# Patient Record
Sex: Female | Born: 1970
Health system: Southern US, Community
[De-identification: ages and names within clinical notes are randomized; demographics above are authoritative.]

## PROBLEM LIST (undated history)

## (undated) DIAGNOSIS — F988 Other specified behavioral and emotional disorders with onset usually occurring in childhood and adolescence: Secondary | ICD-10-CM

## (undated) DIAGNOSIS — R030 Elevated blood-pressure reading, without diagnosis of hypertension: Secondary | ICD-10-CM

## (undated) DIAGNOSIS — K5900 Constipation, unspecified: Secondary | ICD-10-CM

## (undated) DIAGNOSIS — F419 Anxiety disorder, unspecified: Secondary | ICD-10-CM

## (undated) DIAGNOSIS — E739 Lactose intolerance, unspecified: Secondary | ICD-10-CM

## (undated) DIAGNOSIS — Z8049 Family history of malignant neoplasm of other genital organs: Secondary | ICD-10-CM

## (undated) DIAGNOSIS — Z8041 Family history of malignant neoplasm of ovary: Secondary | ICD-10-CM

## (undated) DIAGNOSIS — M1990 Unspecified osteoarthritis, unspecified site: Secondary | ICD-10-CM

## (undated) DIAGNOSIS — Z803 Family history of malignant neoplasm of breast: Secondary | ICD-10-CM

## (undated) DIAGNOSIS — R609 Edema, unspecified: Secondary | ICD-10-CM

## (undated) DIAGNOSIS — F32A Depression, unspecified: Secondary | ICD-10-CM

## (undated) DIAGNOSIS — F439 Reaction to severe stress, unspecified: Secondary | ICD-10-CM

## (undated) DIAGNOSIS — J45909 Unspecified asthma, uncomplicated: Secondary | ICD-10-CM

## (undated) DIAGNOSIS — F329 Major depressive disorder, single episode, unspecified: Secondary | ICD-10-CM

## (undated) DIAGNOSIS — G4730 Sleep apnea, unspecified: Secondary | ICD-10-CM

## (undated) DIAGNOSIS — T7840XA Allergy, unspecified, initial encounter: Secondary | ICD-10-CM

## (undated) DIAGNOSIS — Z800 Family history of malignant neoplasm of digestive organs: Secondary | ICD-10-CM

## (undated) DIAGNOSIS — M549 Dorsalgia, unspecified: Secondary | ICD-10-CM

## (undated) DIAGNOSIS — R7303 Prediabetes: Secondary | ICD-10-CM

## (undated) DIAGNOSIS — D649 Anemia, unspecified: Secondary | ICD-10-CM

## (undated) HISTORY — DX: Lactose intolerance, unspecified: E73.9

## (undated) HISTORY — DX: Edema, unspecified: R60.9

## (undated) HISTORY — DX: Family history of malignant neoplasm of digestive organs: Z80.0

## (undated) HISTORY — DX: Other specified behavioral and emotional disorders with onset usually occurring in childhood and adolescence: F98.8

## (undated) HISTORY — DX: Sleep apnea, unspecified: G47.30

## (undated) HISTORY — DX: Prediabetes: R73.03

## (undated) HISTORY — DX: Major depressive disorder, single episode, unspecified: F32.9

## (undated) HISTORY — DX: Elevated blood-pressure reading, without diagnosis of hypertension: R03.0

## (undated) HISTORY — DX: Unspecified asthma, uncomplicated: J45.909

## (undated) HISTORY — DX: Family history of malignant neoplasm of ovary: Z80.41

## (undated) HISTORY — DX: Unspecified osteoarthritis, unspecified site: M19.90

## (undated) HISTORY — PX: ANKLE SURGERY: SHX546

## (undated) HISTORY — DX: Family history of malignant neoplasm of breast: Z80.3

## (undated) HISTORY — DX: Depression, unspecified: F32.A

## (undated) HISTORY — DX: Family history of malignant neoplasm of other genital organs: Z80.49

## (undated) HISTORY — DX: Anxiety disorder, unspecified: F41.9

## (undated) HISTORY — DX: Allergy, unspecified, initial encounter: T78.40XA

## (undated) HISTORY — DX: Reaction to severe stress, unspecified: F43.9

## (undated) HISTORY — DX: Dorsalgia, unspecified: M54.9

## (undated) HISTORY — DX: Constipation, unspecified: K59.00

## (undated) HISTORY — DX: Anemia, unspecified: D64.9

---

## 1998-08-25 ENCOUNTER — Other Ambulatory Visit: Admission: RE | Admit: 1998-08-25 | Discharge: 1998-08-25 | Payer: Self-pay | Admitting: Obstetrics and Gynecology

## 2002-04-11 ENCOUNTER — Other Ambulatory Visit: Admission: RE | Admit: 2002-04-11 | Discharge: 2002-04-11 | Payer: Self-pay | Admitting: Obstetrics and Gynecology

## 2003-02-14 ENCOUNTER — Emergency Department (HOSPITAL_COMMUNITY): Admission: EM | Admit: 2003-02-14 | Discharge: 2003-02-14 | Payer: Self-pay | Admitting: Emergency Medicine

## 2003-02-14 ENCOUNTER — Encounter: Payer: Self-pay | Admitting: Emergency Medicine

## 2004-02-10 ENCOUNTER — Other Ambulatory Visit: Admission: RE | Admit: 2004-02-10 | Discharge: 2004-02-10 | Payer: Self-pay | Admitting: Obstetrics and Gynecology

## 2005-05-03 ENCOUNTER — Ambulatory Visit: Payer: Self-pay | Admitting: Internal Medicine

## 2005-05-03 ENCOUNTER — Other Ambulatory Visit: Admission: RE | Admit: 2005-05-03 | Discharge: 2005-05-03 | Payer: Self-pay | Admitting: Obstetrics and Gynecology

## 2005-11-01 ENCOUNTER — Ambulatory Visit: Payer: Self-pay | Admitting: Internal Medicine

## 2005-11-02 ENCOUNTER — Ambulatory Visit: Payer: Self-pay | Admitting: Internal Medicine

## 2005-11-03 ENCOUNTER — Ambulatory Visit: Payer: Self-pay | Admitting: Internal Medicine

## 2006-06-23 ENCOUNTER — Ambulatory Visit: Payer: Self-pay | Admitting: Internal Medicine

## 2006-11-08 ENCOUNTER — Ambulatory Visit: Payer: Self-pay | Admitting: Internal Medicine

## 2006-12-10 ENCOUNTER — Encounter: Payer: Self-pay | Admitting: Internal Medicine

## 2006-12-10 LAB — CONVERTED CEMR LAB

## 2006-12-11 ENCOUNTER — Ambulatory Visit: Payer: Self-pay | Admitting: Internal Medicine

## 2006-12-11 LAB — CONVERTED CEMR LAB
BUN: 7 mg/dL (ref 6–23)
CO2: 31 meq/L (ref 19–32)
Calcium: 8.9 mg/dL (ref 8.4–10.5)
Chloride: 108 meq/L (ref 96–112)
Creatinine, Ser: 0.7 mg/dL (ref 0.4–1.2)
GFR calc Af Amer: 122 mL/min
GFR calc non Af Amer: 101 mL/min
Glucose, Bld: 92 mg/dL (ref 70–99)
Hgb A1c MFr Bld: 6 % (ref 4.6–6.0)
Potassium: 4.1 meq/L (ref 3.5–5.1)
Sodium: 141 meq/L (ref 135–145)
TSH: 1.21 microintl units/mL (ref 0.35–5.50)

## 2006-12-13 ENCOUNTER — Ambulatory Visit: Payer: Self-pay | Admitting: Internal Medicine

## 2006-12-25 ENCOUNTER — Ambulatory Visit: Payer: Self-pay | Admitting: Internal Medicine

## 2007-02-08 ENCOUNTER — Ambulatory Visit: Payer: Self-pay | Admitting: Internal Medicine

## 2007-02-08 LAB — CONVERTED CEMR LAB
Bilirubin Urine: NEGATIVE
Crystals: NEGATIVE
Hemoglobin, Urine: NEGATIVE
Ketones, ur: NEGATIVE mg/dL
Leukocytes, UA: NEGATIVE
Nitrite: NEGATIVE
RBC / HPF: NONE SEEN
Specific Gravity, Urine: 1.02 (ref 1.000–1.03)
Total Protein, Urine: NEGATIVE mg/dL
Urine Glucose: NEGATIVE mg/dL
Urobilinogen, UA: 0.2 (ref 0.0–1.0)
pH: 6.5 (ref 5.0–8.0)

## 2007-02-26 ENCOUNTER — Ambulatory Visit: Payer: Self-pay | Admitting: Internal Medicine

## 2007-02-26 LAB — CONVERTED CEMR LAB
BUN: 8 mg/dL (ref 6–23)
CO2: 31 meq/L (ref 19–32)
Calcium: 9.1 mg/dL (ref 8.4–10.5)
Chloride: 105 meq/L (ref 96–112)
Creatinine, Ser: 0.7 mg/dL (ref 0.4–1.2)
GFR calc Af Amer: 122 mL/min
GFR calc non Af Amer: 101 mL/min
Glucose, Bld: 97 mg/dL (ref 70–99)
Potassium: 4.1 meq/L (ref 3.5–5.1)
Pro B Natriuretic peptide (BNP): 14 pg/mL (ref 0.0–100.0)
Sodium: 140 meq/L (ref 135–145)

## 2007-05-17 ENCOUNTER — Ambulatory Visit: Payer: Self-pay | Admitting: Internal Medicine

## 2007-05-17 DIAGNOSIS — J310 Chronic rhinitis: Secondary | ICD-10-CM | POA: Insufficient documentation

## 2007-05-17 DIAGNOSIS — J309 Allergic rhinitis, unspecified: Secondary | ICD-10-CM | POA: Insufficient documentation

## 2007-05-17 DIAGNOSIS — E669 Obesity, unspecified: Secondary | ICD-10-CM | POA: Insufficient documentation

## 2007-05-17 DIAGNOSIS — J45909 Unspecified asthma, uncomplicated: Secondary | ICD-10-CM | POA: Insufficient documentation

## 2007-05-17 DIAGNOSIS — S82899A Other fracture of unspecified lower leg, initial encounter for closed fracture: Secondary | ICD-10-CM | POA: Insufficient documentation

## 2007-05-17 DIAGNOSIS — D649 Anemia, unspecified: Secondary | ICD-10-CM | POA: Insufficient documentation

## 2007-05-17 DIAGNOSIS — Z862 Personal history of diseases of the blood and blood-forming organs and certain disorders involving the immune mechanism: Secondary | ICD-10-CM | POA: Insufficient documentation

## 2007-08-30 ENCOUNTER — Ambulatory Visit: Payer: Self-pay | Admitting: Internal Medicine

## 2007-08-30 DIAGNOSIS — R51 Headache: Secondary | ICD-10-CM | POA: Insufficient documentation

## 2007-08-30 DIAGNOSIS — R7309 Other abnormal glucose: Secondary | ICD-10-CM | POA: Insufficient documentation

## 2007-08-30 DIAGNOSIS — R519 Headache, unspecified: Secondary | ICD-10-CM | POA: Insufficient documentation

## 2007-08-30 LAB — CONVERTED CEMR LAB: Blood Glucose, Fingerstick: 118

## 2008-01-03 ENCOUNTER — Ambulatory Visit: Payer: Self-pay | Admitting: Internal Medicine

## 2008-01-03 DIAGNOSIS — G43909 Migraine, unspecified, not intractable, without status migrainosus: Secondary | ICD-10-CM | POA: Insufficient documentation

## 2008-01-03 DIAGNOSIS — R5383 Other fatigue: Secondary | ICD-10-CM

## 2008-01-03 DIAGNOSIS — R5381 Other malaise: Secondary | ICD-10-CM | POA: Insufficient documentation

## 2008-01-06 ENCOUNTER — Encounter: Payer: Self-pay | Admitting: Internal Medicine

## 2008-01-20 ENCOUNTER — Telehealth: Payer: Self-pay | Admitting: Internal Medicine

## 2008-03-26 ENCOUNTER — Ambulatory Visit: Payer: Self-pay | Admitting: Internal Medicine

## 2008-06-04 ENCOUNTER — Ambulatory Visit: Payer: Self-pay | Admitting: Internal Medicine

## 2008-06-04 DIAGNOSIS — R209 Unspecified disturbances of skin sensation: Secondary | ICD-10-CM | POA: Insufficient documentation

## 2008-10-26 LAB — HM MAMMOGRAPHY: HM Mammogram: NORMAL

## 2008-10-29 ENCOUNTER — Encounter: Admission: RE | Admit: 2008-10-29 | Discharge: 2008-10-29 | Payer: Self-pay | Admitting: Obstetrics and Gynecology

## 2008-11-27 ENCOUNTER — Ambulatory Visit: Payer: Self-pay | Admitting: Internal Medicine

## 2008-11-27 DIAGNOSIS — G4700 Insomnia, unspecified: Secondary | ICD-10-CM | POA: Insufficient documentation

## 2008-11-29 DIAGNOSIS — I10 Essential (primary) hypertension: Secondary | ICD-10-CM | POA: Insufficient documentation

## 2009-01-01 ENCOUNTER — Emergency Department (HOSPITAL_COMMUNITY): Admission: EM | Admit: 2009-01-01 | Discharge: 2009-01-01 | Payer: Self-pay | Admitting: Emergency Medicine

## 2009-02-23 LAB — CONVERTED CEMR LAB: Pap Smear: NORMAL

## 2009-03-19 ENCOUNTER — Ambulatory Visit: Payer: Self-pay | Admitting: Internal Medicine

## 2009-07-07 ENCOUNTER — Telehealth: Payer: Self-pay | Admitting: Internal Medicine

## 2009-10-25 ENCOUNTER — Ambulatory Visit: Payer: Self-pay | Admitting: Internal Medicine

## 2009-10-25 DIAGNOSIS — F4322 Adjustment disorder with anxiety: Secondary | ICD-10-CM | POA: Insufficient documentation

## 2009-10-25 LAB — CONVERTED CEMR LAB
BUN: 9 mg/dL (ref 6–23)
CO2: 28 meq/L (ref 19–32)
Calcium: 9.4 mg/dL (ref 8.4–10.5)
Chloride: 104 meq/L (ref 96–112)
Creatinine, Ser: 0.64 mg/dL (ref 0.40–1.20)
Free T4: 0.91 ng/dL (ref 0.80–1.80)
Glucose, Bld: 89 mg/dL (ref 70–99)
Hgb A1c MFr Bld: 5.5 % (ref <5.7)
Potassium: 4.4 meq/L (ref 3.5–5.3)
Sodium: 139 meq/L (ref 135–145)
TSH: 0.95 microintl units/mL (ref 0.350–4.500)

## 2009-10-28 ENCOUNTER — Encounter: Payer: Self-pay | Admitting: Internal Medicine

## 2009-12-06 ENCOUNTER — Ambulatory Visit: Payer: Self-pay | Admitting: Internal Medicine

## 2010-01-31 ENCOUNTER — Ambulatory Visit: Payer: Self-pay | Admitting: Internal Medicine

## 2010-06-06 ENCOUNTER — Encounter: Payer: Self-pay | Admitting: Internal Medicine

## 2010-06-06 ENCOUNTER — Ambulatory Visit: Payer: Self-pay | Admitting: Internal Medicine

## 2010-06-06 DIAGNOSIS — R259 Unspecified abnormal involuntary movements: Secondary | ICD-10-CM | POA: Insufficient documentation

## 2010-06-06 LAB — CONVERTED CEMR LAB
BUN: 10 mg/dL (ref 6–23)
CO2: 27 meq/L (ref 19–32)
Calcium: 9 mg/dL (ref 8.4–10.5)
Chloride: 104 meq/L (ref 96–112)
Creatinine, Ser: 0.73 mg/dL (ref 0.40–1.20)
Free T4: 0.76 ng/dL — ABNORMAL LOW (ref 0.80–1.80)
Glucose, Bld: 83 mg/dL (ref 70–99)
Magnesium: 2.2 mg/dL (ref 1.5–2.5)
Potassium: 4.1 meq/L (ref 3.5–5.3)
Sodium: 139 meq/L (ref 135–145)
TSH: 0.987 microintl units/mL (ref 0.350–4.500)

## 2010-06-07 ENCOUNTER — Telehealth: Payer: Self-pay | Admitting: Internal Medicine

## 2010-07-24 LAB — CONVERTED CEMR LAB
BUN: 8 mg/dL (ref 6–23)
Basophils Absolute: 0 10*3/uL (ref 0.0–0.1)
Basophils Relative: 0 % (ref 0–1)
Bilirubin Urine: NEGATIVE
CO2: 24 meq/L (ref 19–32)
Calcium: 9.2 mg/dL (ref 8.4–10.5)
Chloride: 101 meq/L (ref 96–112)
Creatinine, Ser: 0.7 mg/dL (ref 0.40–1.20)
Eosinophils Absolute: 0.1 10*3/uL (ref 0.0–0.7)
Eosinophils Relative: 1 % (ref 0–5)
Folate: >20 ng/mL
Glucose, Bld: 80 mg/dL (ref 70–99)
HCT: 40.2 % (ref 36.0–46.0)
Hemoglobin, Urine: NEGATIVE
Hemoglobin: 12.7 g/dL (ref 12.0–15.0)
Hgb A1c MFr Bld: 5.8 % (ref 4.6–6.1)
Ketones, ur: NEGATIVE mg/dL
Leukocytes, UA: NEGATIVE
Lymphocytes Relative: 38 % (ref 12–46)
Lymphs Abs: 2.2 10*3/uL (ref 0.7–4.0)
MCHC: 31.6 g/dL (ref 30.0–36.0)
MCV: 77.8 fL — ABNORMAL LOW (ref 78.0–100.0)
Monocytes Absolute: 0.4 10*3/uL (ref 0.1–1.0)
Monocytes Relative: 7 % (ref 3–12)
Neutro Abs: 3.1 10*3/uL (ref 1.7–7.7)
Neutrophils Relative %: 53 % (ref 43–77)
Nitrite: NEGATIVE
Platelets: 389 10*3/uL (ref 150–400)
Potassium: 4.7 meq/L (ref 3.5–5.3)
Protein, ur: NEGATIVE mg/dL
RBC / HPF: NONE SEEN (ref <3)
RBC: 5.17 M/uL — ABNORMAL HIGH (ref 3.87–5.11)
RDW: 15.3 % (ref 11.5–15.5)
Sodium: 136 meq/L (ref 135–145)
Specific Gravity, Urine: 1.018 (ref 1.005–1.03)
TSH: 0.722 microintl units/mL (ref 0.350–4.50)
Urine Glucose: NEGATIVE mg/dL
Urobilinogen, UA: 0.2 (ref 0.0–1.0)
Vitamin B-12: 278 pg/mL (ref 211–911)
WBC, UA: NONE SEEN cells/hpf (ref <3)
WBC: 5.8 10*3/uL (ref 4.0–10.5)
pH: 6.5 (ref 5.0–8.0)

## 2010-07-28 NOTE — Letter (Signed)
     January 06, 2008   Brooke [name] [address] [place], [place] [address]  RE:  LAB RESULTS  Dear  [name],  The following is an interpretation of your most recent lab tests.  Please take note of any instructions provided or changes to medications that have resulted from your lab work.  ELECTROLYTES:  Good - no changes needed  KIDNEY FUNCTION TESTS:  Good - no changes needed  THYROID STUDIES:  Thyroid studies normal TSH: 0.722     DIABETIC STUDIES:  Good - no changes needed Blood Glucose: 80   HgbA1C: 5.8     CBC:  Stable - no changes needed  B12 vitamin level - 278 (low normal)   I recommend starting an OTC Vitamin B complex daily.  A low normal B12 level can contribute to fatigue symptoms.  We should repeat your B12 level in 2 months.   Sincerely Yours,    Dr. Thomos Lemons

## 2010-07-28 NOTE — Assessment & Plan Note (Signed)
Summary: DISCUSS WEIGHT LOSS OPTIONS/NML   Vital Signs:  Patient Profile:   40 Years Old Female Height:     62 inches Weight:      268.25 pounds BMI:     49.24 Temp:     96.9 degrees F oral BP sitting:   140 / 80  (left arm)  Vitals Entered By: Glendell Docker (May 17, 2007 2:16 PM)                 Chief Complaint:  DICUSS WT GAIN.  History of Present Illness: 40 year old African-American female here for follow-up regarding obesity.  Patient has tried and failed dietary measures.  She is not able to stick to a diet.  She also joined a gym but is not gone a regular basis.  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  She often works late hours.  Current Allergies (reviewed today): No known allergies   Past Medical History:    Allergic rhinitis    Anemia-NOS    Hypertension    Genital Herpes   Family History:    Reviewed history and no changes required:  Social History:    Reviewed history and no changes required:    Review of Systems      See HPI   Physical Exam  General:     alert, normal appearance, and overweight-appearing.   Lungs:     Normal respiratory effort, chest expands symmetrically. Lungs are clear to auscultation, no crackles or wheezes. Heart:     Normal rate and regular rhythm. S1 and S2 normal without gallop, murmur, click, rub or other extra sounds. Extremities:     trace left pedal edema and trace right pedal edema.   Psych:     tearful.      Impression & Recommendations:  Problem # 1:  OBESITY (ICD-278.00) Pt has physical signs of insulin resistance.  Discussed risks and benefits of "wt loss" drugs. Advised trial of glucophage xr 500 mg by mouth two times a day (off label).  She wished to consider lap band surgery.   Problem # 2:  HYPERTENSION, ESSENTIAL NOS (ICD-401.9) Cont 1/2 of Benicar/Hctz daily.  The following medications were removed from the medication list:    Hydrochlorothiazide 12.5  Mg Tabs (Hydrochlorothiazide) ..... One by mouth once daily  Her updated medication list for this problem includes:    Benicar Hct 20-12.5 Mg Tabs (Olmesartan medoxomil-hctz) .Marland Kitchen... 1/2 by mouth once daily  Labs Reviewed: Creat: 0.7 (02/26/2007)   Complete Medication List: 1)  Benicar Hct 20-12.5 Mg Tabs (Olmesartan medoxomil-hctz) .... 1/2 by mouth once daily 2)  Glucophage Xr 500 Mg Tb24 (Metformin hcl) .... One by mouth two times a day 3)  Valtrex 1 Gm Tabs (Valacyclovir hcl) .... One by mouth two times a day     Prescriptions: GLUCOPHAGE XR 500 MG  TB24 (METFORMIN HCL) one by mouth two times a day  #60 x 3   Entered and Authorized by:   Dondra Spry DO   Signed by:   Dondra Spry DO on 05/17/2007   Method used:   Electronically sent to ...       [facility] #[id number]*       [address]       [place], [place]  [address]       Ph: [phone]       Fax: [phone]   RxID:  [id number]  ] 

## 2010-07-28 NOTE — Assessment & Plan Note (Signed)
Summary: re: Blood Pressure   Vital Signs:  Patient Profile:   40 Years Old Female Height:     62 inches Weight:      256.50 pounds BMI:     47.08 Temp:     98.3 degrees F oral Pulse rate:   82 / minute Resp:     16 per minute BP sitting:   110 / 80  (right arm) Cuff size:   large  Vitals Entered By: Glendell Docker CMA (March 26, 2008 3:46 PM)                 Chief Complaint:  Follow up Blood Pressure.  History of Present Illness:  Follow-Up Visit      This is a 40 year old woman who presents for Follow-up visit.  The patient denies chest pain and dizziness.  The patient reports monitoring BP.    Pt stopped topamax due to side effect.  He increased depressive symptoms.    Current Allergies (reviewed today): No known allergies   Past Medical History:    Allergic rhinitis    Anemia-NOS    Hypertension    Genital Herpes    Migraines    Social History:    Occupation:  Principle for Primary school teacher [employer]    Single    Never Smoked    Alcohol use-no     Review of Systems      See HPI   Physical Exam  General:     alert, well-developed, and well-nourished.   Head:     normocephalic and atraumatic.   Neck:     supple and no masses.   Lungs:     normal respiratory effort and normal breath sounds.   Heart:     normal rate, regular rhythm, and no gallop.   Extremities:     No lower extremity edema  Psych:     normally interactive, good eye contact, not anxious appearing, and not depressed appearing.      Impression & Recommendations:  Problem # 1:  HYPERTENSION (ICD-401.9) BP is stable off medication.   She reports less stress at work.  She has maintained most of her weight loss.   BP today: 110/80 Prior BP: 131/85 (01/03/2008)  Labs Reviewed: Creat: 0.70 (01/03/2008)   Problem # 2:  HEADACHE (ICD-784.0) Pt could not tolerate topamax.   She reports increased depressive symptoms and tearfulness.  Her symptoms has resolved with  cessation of topamax.  She is getting 2 headaches per month.   She take 600 mg of ibuprofen as soon as she feels symptoms.  Other Orders: Influenza Vaccine NON MCR (13086)   Patient Instructions: 1)  Please schedule a follow-up appointment in 4 months.   ]   Current Allergies (reviewed today): No known allergies    Influenza Vaccine    Vaccine Type: Fluvax Non-MCR    Site: left deltoid    Mfr: GlaxoSmithKline    Dose: 0.5 ml    Route: IM    Given by: Darra Lis RMA    Exp. Date: 12/23/2008    Lot #: [id number]  Flu Vaccine Consent Questions    Do you have a history of severe allergic reactions to this vaccine? no    Any prior history of allergic reactions to egg and/or gelatin? no    Do you have a sensitivity to the preservative Thimersol? no    Do you have a past history of Guillan-Barre Syndrome? no    Do you  currently have an acute febrile illness? no    Have you ever had a severe reaction to latex? no    Vaccine information given and explained to patient? yes    Are you currently pregnant? no

## 2010-07-28 NOTE — Assessment & Plan Note (Signed)
Summary: sleeping issues & check BP - jr   Vital Signs:  Patient profile:   40 year old female Weight:      262.50 pounds BMI:     48.19 Temp:     98.1 degrees F oral Pulse rate:   84 / minute Pulse rhythm:   regular BP sitting:   110 / 80  (left arm) Cuff size:   large  Vitals Entered By: Glendell Docker CMA (November 27, 2008 4:11 PM)  Primary Care Provider:  Dondra Spry DO  CC:  Difficulty Sleeping and Insomnia.  History of Present Illness: Insomnia      This is a 40 year old woman who presents with Insomnia.  The patient reports frequent awakening, early awakening, and daytime somnolence, but denies leg movements and snoring.  Insomnia has led to problems with memory loss and impaired judgement.  Risk factors for insomnia include obesity and caffeine use.  Behaviors that may contribute to insomnia include alcohol use.  She is principal for local elementary school.  She notes increased work stress.  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   Lungs:  normal respiratory effort and normal breath sounds.   Heart:  normal rate, regular rhythm, and no gallop.   Extremities:  trace left pedal edema and trace right pedal edema.   Neurologic:  cranial nerves II-XII intact and gait normal.   Psych:  normally interactive, good eye contact, and labile affect.     Impression & Recommendations:  Problem # 1:  INSOMNIA (ICD-780.52) 40 y/o AA female with insomnia over last 1 month.   Symptoms likely triggered by work stress.   We discussed stress mgt techniques.   Short term use of triazolam.   Patient advised to call office if symptoms persist or worsen.   Her  updated medication list for this problem includes:    Triazolam 0.125 Mg Tabs (Triazolam) ..... One by mouth at bedtime prn  Discussed sleep hygiene.   Complete Medication List: 1)  Triazolam 0.125 Mg Tabs (Triazolam) .... One by mouth at bedtime prn  Patient Instructions: 1)  Please schedule a follow-up appointment as needed. Prescriptions: TRIAZOLAM 0.125 MG TABS (TRIAZOLAM) one by mouth at bedtime prn  #30 x 0   Entered and Authorized by:   D. Thomos Lemons DO   Signed by:   D. Thomos Lemons DO on 11/27/2008   Method used:   Print then Give to Patient   RxID:   [id number]   Current Allergies (reviewed today): No known allergies

## 2010-07-28 NOTE — Assessment & Plan Note (Signed)
Summary: TRIED ALL TIME/BLOOD SUGAR/HEA   Vital Signs:  Patient Profile:   39 Years Old Female Height:     62 inches Weight:      257.25 pounds BMI:     47.22 Temp:     98 degrees F oral Pulse rate:   75 / minute Pulse rhythm:   regular Resp:     18 per minute BP sitting:   131 / 85  (left arm)  Vitals Entered By: Glendell Docker (January 03, 2008 10:56 AM)                 Chief Complaint:  Multiple medical problems or concerns.  History of Present Illness: Follow-Up Visit      40 year old African-American female with past medical history of hypertension for follow-up.  Since previous visit patient complains of moderate fatigue for the last one to two weeks.  She has been sleeping more than usual.  She denies recent illness.   She denies respiratory or urinary complaints.  She also has a history of migraine headaches.  Patient was seen in March of 2009 for severe headache.  Patient reports having at least one to two headaches per month.  Her migraines are often associated with her menstrual cycle.  She is complaining of discomfort in her right shoulder with pain on abduction. She says this has been ongoing for the past 5 months.  Her symptoms are worse when she lays on her right shoulder at night.  She denies joint redness or swelling.    Current Allergies (reviewed today): No known allergies   Past Medical History:    Reviewed history from 08/30/2007 and no changes required:       Allergic rhinitis       Anemia-NOS       Hypertension       Genital Herpes       Migraines   Social History:    Occupation:  Principle for Primary school teacher school [employer]    Single    Never Smoked    Alcohol use-no   Risk Factors:  Tobacco use:  never Alcohol use:  no   Review of Systems      See HPI   Physical Exam  General:     alert, well-developed, and well-nourished.   Head:     normocephalic and atraumatic.   Eyes:     vision grossly intact, pupils equal, pupils  round, and pupils reactive to light.   Ears:     R ear normal and L ear normal.   Mouth:     Oral mucosa and oropharynx without lesions or exudates.  Teeth in good repair. Neck:     supple and no masses.   Lungs:     normal respiratory effort and normal breath sounds.   Heart:     normal rate, regular rhythm, no murmur, and no gallop.   Abdomen:     soft and non-tender.   Extremities:     No lower extremity edema  Neurologic:     cranial nerves II-XII intact and strength normal in all extremities.   Skin:     turgor normal and color normal.   Psych:     normally interactive and good eye contact.      Impression & Recommendations:  Problem # 1:  FATIGUE (ICD-780.79) Rule out organic etiology.  ? viral syndrome.  If persistent, consider sleep study Orders: T-Basic Metabolic Panel [id number]) T-CBC w/Diff [id number]) T- Hemoglobin A1C ([id number]) T-TSH [id number])  T-Vitamin B12 [id number]) T-Culture, Urine ([id number]) T- * Misc. Laboratory test [phone])   Problem # 2:  ABNORMAL GLUCOSE NEC (ICD-790.29) Monitor A1c. The following medications were removed from the medication list:    Glucophage Xr 500 Mg Tb24 (Metformin hcl) ..... One by mouth two times a day  Orders: T- Hemoglobin A1C ([id number])   Problem # 3:  MIGRAINE HEADACHE (ICD-346.90) Start topamax for migraine prophylaxis.  We discussed common side effects.  The following medications were removed from the medication list:    Fioricet 50-325-40 Mg Tabs (Butalbital-apap-caffeine) .Marland Kitchen... 1-2 by mouth qid prn    Ibuprofen 600 Mg Tabs (Ibuprofen) .Marland Kitchen... 1 by mouth three times a day as needed pain   Problem # 4:  HYPERTENSION (ICD-401.9) Pt stopped Benicar.  BP has been stable.  Pt will continue to monitor BP.  The following medications were removed from the medication list:    Benicar Hct 20-12.5 Mg Tabs (Olmesartan medoxomil-hctz) .Marland Kitchen... 1/2 by mouth once daily  BP today: 131/85 Prior BP:  133/87 (08/30/2007)  Labs Reviewed: Creat: 0.7 (02/26/2007)   Complete Medication List: 1)  Topamax 25 Mg Tabs (Topiramate) .... One by mouth at bedtime x 7 days, then 50 mg by mouth qhs   Patient Instructions: 1)  Please schedule a follow-up appointment in 6 weeks.   Prescriptions: TOPAMAX 25 MG  TABS (TOPIRAMATE) one by mouth at bedtime x 7 days, then 50 mg by mouth qhs  #60 x 2   Entered by:   Glendell Docker   Authorized by:   D. Thomos Lemons DO   Signed by:   Glendell Docker on 01/03/2008   Method used:   Faxed to ...       [facility] [address] #[id number]*       [address]       [place], [place]  [address]       Ph: [phone]       Fax: [phone]   RxID:   [id number] TOPAMAX 25 MG  TABS (TOPIRAMATE) one by mouth at bedtime x 7 days, then 50 mg by mouth qhs  #60 x 2   Entered and Authorized by:   D. Thomos Lemons DO   Signed by:   D. Thomos Lemons DO on 01/03/2008   Method used:   Electronically sent to ...       [facility] #[id number]       [address]       [place], [place]  [address]       Ph: [phone]       Fax: [phone]   RxID:   [id number]  ] Current Allergies (reviewed today): No known allergies Pharmacy correction previous Rx to [facility] cancelled. Glendell Docker  January 03, 2008 4:28 PM

## 2010-07-28 NOTE — Assessment & Plan Note (Signed)
Summary: BP CK  /HEA   Vital Signs:  Patient profile:   40 year old female Menstrual status:  regular LMP:     03/19/2009 Weight:      266.50 pounds Temp:     98.4 degrees F oral Pulse rate:   72 / minute Pulse rhythm:   regular Resp:     18 per minute BP sitting:   128 / 70  (right arm) Cuff size:   large  Vitals Entered By: Glendell Docker CMA (March 19, 2009 10:04 AM) CC: Elevated Blood Pressure LMP (date): 03/19/2009     Menstrual Status regular Enter LMP: 03/19/2009 Last PAP Result normal   Primary Care Provider:  Dondra Spry DO  CC:  Elevated Blood Pressure.  History of Present Illness: 40 y/o AA F for BP follow up.  Blood pressure was checked by school nurse yesterday and was 148/100,  and she was advised to schedule office visit for evaluation.  No headaches or chest pain.   + stress at work.   She has early meetings and works late.  No eating right.  Gained 4 lbs.  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   Neck:  supple and no masses.  no carotid bruits.   Lungs:  normal respiratory effort and normal breath sounds.   Heart:  normal rate, regular rhythm, no murmur, and no gallop.   Extremities:  No lower extremity edema    Impression & Recommendations:  Problem # 1:  ELEVATED BLOOD PRESSURE WITHOUT DIAGNOSIS OF HYPERTENSION (ICD-796.2) Pt with prehypertension.  She discussed whether to restart Hctz vs lifestyle modification.  She elects lifestyle modification.   Continue to monitor BP at home.   BP today: 128/70 Prior BP: 110/80 (11/27/2008)  Labs Reviewed: Creat: 0.70 (01/03/2008)  Instructed in low sodium  diet (DASH Handout) and behavior modification.     Preventive Care Screening  Pap Smear:    Date:  02/23/2009    Results:  normal   Mammogram:    Date:  10/26/2008    Results:  normal    Current Allergies (reviewed today): No known allergies

## 2010-07-28 NOTE — Progress Notes (Signed)
Summary: Lab results  Phone Note Outgoing Call   Summary of Call: call pt - labs normal  Initial call taken by: D. Thomos Lemons DO,  June 07, 2010 8:18 AM  Follow-up for Phone Call        call placed to patient at [phone], no  answer. A detaied voice message was left informing patient per Dr Artist Pais instructions Follow-up by: Glendell Docker CMA,  June 07, 2010 9:08 AM

## 2010-07-28 NOTE — Assessment & Plan Note (Signed)
Summary: DR Artist Pais PT--SEVERE HEAD ACHE SINCE YESTERDAY PM-$50 INFO-STC   Vital Signs:  Patient Profile:   40 Years Old Female Height:     62 inches Weight:      163 pounds Temp:     97.5 degrees F oral Pulse rate:   69 / minute BP sitting:   133 / 87  (right arm)             Is Patient Diabetic? Yes  CBG Result 118     Chief Complaint:  Multiple medical problems or concerns.  History of Present Illness: C/o HA x 2 d, constant over the forehead.    Current Allergies: No known allergies   Past Medical History:    Allergic rhinitis    Anemia-NOS    Hypertension    Genital Herpes    Migraines      Physical Exam  General:     overweight-appearing.   Eyes:     No corneal or conjunctival inflammation noted. EOMI. Perrla. Funduscopic exam benign, without hemorrhages, exudates or papilledema. Vision grossly normal. Ears:     External ear exam shows no significant lesions or deformities.  Otoscopic examination reveals clear canals, tympanic membranes are intact bilaterally without bulging, retraction, inflammation or discharge. Hearing is grossly normal bilaterally. Nose:     External nasal examination shows no deformity or inflammation. Nasal mucosa are pink and moist without lesions or exudates. Mouth:     Oral mucosa and oropharynx without lesions or exudates.  Teeth in good repair. Neck:     No deformities, masses, or tenderness noted. Lungs:     Normal respiratory effort, chest expands symmetrically. Lungs are clear to auscultation, no crackles or wheezes. Heart:     Normal rate and regular rhythm. S1 and S2 normal without gallop, murmur, click, rub or other extra sounds. Abdomen:     Bowel sounds positive,abdomen soft and non-tender without masses, organomegaly or hernias noted. Msk:     Neck supple Neurologic:     No cranial nerve deficits noted. Station and gait are normal. Plantar reflexes are down-going bilaterally. DTRs are symmetrical throughout.  Sensory, motor and coordinative functions appear intact.    Impression & Recommendations:  Problem # 1:  HEADACHE (ICD-784.0)  Her updated medication list for this problem includes:    Fioricet 50-325-40 Mg Tabs (Butalbital-apap-caffeine) .Marland Kitchen... 1-2 by mouth qid prn    Ibuprofen 600 Mg Tabs (Ibuprofen) .Marland Kitchen... 1 by mouth three times a day as needed pain   Problem # 2:  ABNORMAL GLUCOSE NEC (ICD-790.29) Glu=118 Her updated medication list for this problem includes:    Glucophage Xr 500 Mg Tb24 (Metformin hcl) ..... One by mouth two times a day  Orders: Glucose, (CBG) ([address])   Complete Medication List: 1)  Benicar Hct 20-12.5 Mg Tabs (Olmesartan medoxomil-hctz) .... 1/2 by mouth once daily 2)  Glucophage Xr 500 Mg Tb24 (Metformin hcl) .... One by mouth two times a day 3)  Valtrex 1 Gm Tabs (Valacyclovir hcl) .... One by mouth two times a day 4)  Fioricet 50-325-40 Mg Tabs (Butalbital-apap-caffeine) .Marland Kitchen.. 1-2 by mouth qid prn 5)  Ibuprofen 600 Mg Tabs (Ibuprofen) .Marland Kitchen.. 1 by mouth three times a day as needed pain   Patient Instructions: 1)  Please schedule a follow-up appointment in 3 months Dr Artist Pais. 2)  BMP prior to visit, ICD-9: 3)  HbgA1C prior to visit, ICD-9:250.00    Prescriptions: IBUPROFEN 600 MG  TABS (IBUPROFEN) 1 by mouth three times a day  as needed pain  #60 x 3   Entered and Authorized by:   Tresa Garter MD   Signed by:   Tresa Garter MD on 08/30/2007   Method used:   Print then Give to Patient   RxID:   [id number] FIORICET 50-325-40 MG  TABS (BUTALBITAL-APAP-CAFFEINE) 1-2 by mouth qid prn  #60 x 3   Entered and Authorized by:   Tresa Garter MD   Signed by:   Tresa Garter MD on 08/30/2007   Method used:   Print then Give to Patient   RxID:   [id number]  ]

## 2010-07-28 NOTE — Progress Notes (Signed)
Summary: Triazolam Refill  Phone Note Call from Patient Call back at [phone]   Caller: Patient Summary of Call: patient called and left voice message requesting a refill on a mild sedative that she was prescribed a few months back.   call returned to patient for clarification of medication needed for refill. She is requesting a refill on  Triazolam 0.125. If approved she is requesting a refill to [facility] on [address]. Patient was informed that Dr Artist Pais was out of the office this afternoon and she would receive a call back  in the morning regarding the status. She verablized understanding and said she may be in a meeting so it is okay to leave a message if she could not be reached. Initial call taken by: Glendell Docker CMA,  July 07, 2009 4:11 PM  Follow-up for Phone Call        ok to refill x 3 Follow-up by: D. Thomos Lemons DO,  July 08, 2009 5:53 PM  Additional Follow-up for Phone Call Additional follow up Details #1::        Rx Called In, patient informed Additional Follow-up by: Glendell Docker CMA,  July 09, 2009 8:14 AM    New/Updated Medications: TRIAZOLAM 0.125 MG TABS (TRIAZOLAM) Take 1 tab by mouth at bedtime as needed Prescriptions: TRIAZOLAM 0.125 MG TABS (TRIAZOLAM) Take 1 tab by mouth at bedtime as needed  #30 x 3   Entered by:   Glendell Docker CMA   Authorized by:   D. Thomos Lemons DO   Signed by:   Glendell Docker CMA on 07/09/2009   Method used:   Telephoned to ...       [facility] [address] #[id number]* (retail)       [address] [place], [place]  [address]       Ph: [phone]       Fax: [phone]   RxID:   [id number]

## 2010-07-28 NOTE — Assessment & Plan Note (Signed)
Summary: 2 month fu/dt   Vital Signs:  Patient profile:   40 year old female Menstrual status:  regular Weight:      260 pounds O2 Sat:      99 % on Room air Temp:     98.0 degrees F oral Pulse rate:   87 / minute Pulse rhythm:   regular Resp:     18 per minute BP sitting:   120 / 80  (right arm) Cuff size:   large  Vitals Entered By: Glendell Docker CMA (January 31, 2010 4:12 PM)  O2 Flow:  Room air CC: Rm 3- 2 Month Follow up Is Patient Diabetic? No Pain Assessment Patient in pain? no        Primary Care Provider:  DThomos Lemons DO  CC:  Rm 3- 2 Month Follow up.  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   Lungs:  normal respiratory effort, normal breath sounds, and no wheezes.   Heart:  normal rate, regular rhythm, and no gallop.   Abdomen:  soft, non-tender, and normal bowel sounds.   Psych:  normally interactive, good eye contact, not anxious appearing, and not depressed appearing.     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  she would like to continue medication her mother has noticed positive change in her mood concentration is also better  Problem #  2:  INSOMNIA (ICD-780.52) use as needed.  pt advised to avoid daily use  Her updated medication list for this problem includes:    Triazolam 0.25 Mg Tabs (Triazolam) ..... One by mouth qhs  Complete Medication List: 1)  Triazolam 0.25 Mg Tabs (Triazolam) .... One by mouth qhs 2)  Budeprion Xl 300 Mg Xr24h-tab (Bupropion hcl) .... One by mouth once daily  Patient Instructions: 1)  Please schedule a follow-up appointment in 4 months. Prescriptions: TRIAZOLAM 0.25 MG TABS (TRIAZOLAM) one by mouth qhs  #30 x 3   Entered and Authorized by:   D. Thomos Lemons DO   Signed by:   D. Thomos Lemons DO on 01/31/2010   Method used:   Print then Give to Patient   RxID:   [id number] BUDEPRION XL 300 MG XR24H-TAB (BUPROPION HCL) one by mouth once daily  #30 x 5   Entered and Authorized by:   D. Thomos Lemons DO   Signed by:   D. Thomos Lemons DO on 01/31/2010   Method used:   Electronically to        [id number]* (retail)       [address] [place], [place]  [address]       Ph: [phone]  Fax: [phone]   RxID:   [id number]   Current Allergies (reviewed today): No known allergies

## 2010-07-28 NOTE — Letter (Signed)
Summary: Work Dietitian at [employer]. [address]   [place], [place] [address]   Phone: [phone]  Fax: [phone]        Today's Date: Oct 25, 2009   Name of Patient: Brooke [name]  The above named patient had a medical visit today at:  11:30 am. Please take this into consideration when reviewing the time away from work.    Sincerely yours,    Mervin Kung CMA Thomos Lemons, DO

## 2010-07-28 NOTE — Assessment & Plan Note (Signed)
Summary: F/U/hea--rm 3   Vital Signs:  Patient profile:   41 year old female Menstrual status:  regular Height:      62 inches Weight:      256 pounds BMI:     46.99 Temp:     98.8 degrees F oral Pulse rate:   84 / minute Pulse rhythm:   regular Resp:     16 per minute BP sitting:   138 / 88  (right arm) Cuff size:   large  Vitals Entered By: Mervin Kung CMA (December 06, 2009 4:05 PM) CC: Room 3  Follow up. Is Patient Diabetic? 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   Neck:  supple and no masses.  no carotid bruits.   Lungs:  normal respiratory effort, normal breath sounds, and no wheezes.   Heart:  normal rate, regular rhythm, and no gallop.   Extremities:  trace left pedal edema and trace right pedal edema.   Neurologic:  cranial nerves II-XII intact and gait normal.     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  no side effects.  titrate dose to 300 mg to improve concentration  Complete Medication List: 1)  Triazolam 0.125 Mg Tabs (Triazolam) .... Take 1 tab by mouth at bedtime as needed 2)  Budeprion Xl 300 Mg Xr24h-tab (Bupropion hcl)  .... One by mouth once daily  Patient Instructions: 1)  Please schedule a follow-up appointment in 2 months. Prescriptions: BUDEPRION XL 300 MG XR24H-TAB (BUPROPION HCL) one by mouth once daily  #30 x 2   Entered and Authorized by:   D. Thomos Lemons DO   Signed by:   D. Thomos Lemons DO on 12/06/2009   Method used:   Electronically to        [id number]* (retail)       [address] [place], [place]  [address]       Ph: [phone]       Fax: [phone]   RxID:   [id number]

## 2010-07-28 NOTE — Letter (Signed)
Summary: Weight Mgmt Form/[facility]  Weight Mgmt [facility]   Imported By: Lanelle Bal 12/13/2009 11:20:06  _____________________________________________________________________  External Attachment:    Type:   Image     Comment:   External Document

## 2010-07-28 NOTE — Progress Notes (Signed)
Summary: WANTS TO STOP TAKING MIGRANE MED  Phone Note Call from Patient   Caller: Patient Details for Reason: WATNS TO STOP TAKING NEW MED FOR MIGRANES Summary of Call: PT CALLED STATING SHE WANTS TO STOP TAKING THE NEW MEDICATE THAT STARTS WITH A "T". AS IT IS MAKING HER FEEL CRAZY, IE SHE CRYING A LOT AND MAKES HER DEPRESSED.  SHE WANTS TO KNOW IF THIS IS A DRUG THAT SHE NEEDS TO GRADUALLY WORK HER WAY OFF OR IF SHE CAN JUST STOP IMMEDIATELY.  PLEASE CALL HER ATER 4 PM OR LEAVE HER A MESSAGE, SHE IS GOING TO BE IN TRAINING.  [phone] Initial call taken by: Eliezer Lofts,  January 20, 2008 12:02 PM  Follow-up for Phone Call        reduce to 25 mg by mouth once daily x 5 days then stop.  Rec OV Follow-up by: D. Thomos Lemons DO,  January 20, 2008 9:40 PM  Additional Follow-up for Phone Call Additional follow up Details #1::        Left voice message at [phone] to call Additional Follow-up by: Glendell Docker CMA,  January 21, 2008 8:53 AM    Additional Follow-up for Phone Call Additional follow up Details #2::    Patient informed per Dr Artist Pais instructions Follow-up by: Glendell Docker CMA,  January 21, 2008 6:43 PM

## 2010-07-28 NOTE — Assessment & Plan Note (Signed)
Summary: toes have been numb for the past week   Vital Signs:  Patient Profile:   40 Years Old Female Height:     62 inches Weight:      257.50 pounds BMI:     47.27 Temp:     98.0 degrees F oral Pulse rate:   86 / minute Pulse rhythm:   regular Resp:     18 per minute BP sitting:   112 / 78  (right arm) Cuff size:   large  Vitals Entered By: Glendell Docker CMA (June 04, 2008 1:46 PM)                 PCP:  Dondra Spry DO  Chief Complaint:  C/O numbness in left toes for the past week.  History of Present Illness: 40 y/o AA female with hx of elevated BP in the past c/o numbness in left toes for the past week.  Her symptoms worse with tight fitting shoes.  No foot pain.   No change in color of skin.    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   Neck:     supple and no masses.   Lungs:     normal respiratory effort and normal breath sounds.   Heart:     normal rate, regular rhythm, and no gallop.   Pulses:     dorsalis pedis and posterior tibial pulses are full and equal bilaterally Extremities:     trace left pedal edema and trace right pedal edema.   Neurologic:     Normal sensation to temp and vibration of LE.    Impression & Recommendations:  Problem # 1:  PARESTHESIA (ICD-782.0) Numbness in first 2-3 toes of left foot.   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  Pt advised to wear comfortable shoes.  Patient advised to call office if symptoms persist or worsen.  If symptoms worsen, refer to ortho or podiatry.  ? Tarsal tunnel syndrome.       ] Current Allergies (reviewed today): No known allergies

## 2010-07-28 NOTE — Letter (Signed)
   [facility] [address] [place], [place]  [address]  [place] Phone: [phone]      Oct 28, 2009   Brooke [name] [address] [place], [place] [address]  RE:  LAB RESULTS  Dear  [name],  The following is an interpretation of your most recent lab tests.  Please take note of any instructions provided or changes to medications that have resulted from your lab work.  ELECTROLYTES:  Good - no changes needed  KIDNEY FUNCTION TESTS:  Good - no changes needed  THYROID STUDIES:  Thyroid studies normal TSH: 0.950     DIABETIC STUDIES:  Excellent - no changes needed Blood Glucose: 89   HgbA1C: 5.5          Sincerely Yours,    Dr. Thomos Lemons

## 2010-07-28 NOTE — Assessment & Plan Note (Signed)
Summary: 4 month fu/dt   Vital Signs:  Patient profile:   40 year old female Menstrual status:  regular Height:      62 inches Weight:      261.50 pounds BMI:     48.00 O2 Sat:      100 % on Room air Temp:     97.9 degrees F oral Pulse rate:   78 / minute Resp:     18 per minute BP sitting:   122 / 90  (right arm) Cuff size:   large  Vitals Entered By: Glendell Docker CMA (June 06, 2010 3:50 PM)  O2 Flow:  Room air CC: 4 Month Follow up Comments c/o muslce spasms in right eye for the past 3 weeks with eye pain, intermittently, lasting about 10 seconds 3-4 times in about 1 hour intervals, would like to know if it could be stress related   Primary Care Provider:  DThomos Lemons DO  CC:  4 Month Follow up.  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   Lungs:  normal respiratory effort, normal breath sounds, and no wheezes.   Heart:  normal rate, regular rhythm, and no gallop.   Extremities:  trace left pedal edema and trace right pedal edema.   Psych:  normally interactive, good eye contact, not anxious appearing, and not depressed appearing.     Impression & Recommendations:  Problem # 1:  MUSCLE FASCICULATIONS (ICD-781.0) no significant caffeine use.  likely benign. check electrolytes  Orders: T-Basic Metabolic Panel [phone]) T-TSH [phone]) T-T4, Free [phone]) T-Magnesium [phone])  Problem #  2:  ADJUSTMENT DISORDER WITH ANXIOUS MOOD [phone]) Assessment: Improved  Problem # 3:  OBESITY (ICD-278.00) I advised against use of phenterimine Pt counseled on diet and exercise.  Complete Medication List: 1)  Triazolam 0.25 Mg Tabs (Triazolam) .... One by mouth qhs 2)  Budeprion Xl 300 Mg Xr24h-tab (Bupropion hcl) .... One by mouth once daily  Patient Instructions: 1)  Please schedule a follow-up appointment in 6 months. Prescriptions: TRIAZOLAM 0.25 MG TABS (TRIAZOLAM) one by mouth qhs  #30 x 3   Entered and Authorized by:   D. Thomos Lemons DO   Signed by:   D. Thomos Lemons DO on 06/06/2010   Method used:   Print then Give to Patient   RxID:   [id number] BUDEPRION XL 300 MG XR24H-TAB (BUPROPION HCL) one by mouth once daily  #90 x 1   Entered and Authorized by:   D. Thomos Lemons DO   Signed by:   D. Thomos Lemons DO on 06/06/2010   Method used:   Electronically to        [id number]* (retail)       [address] [place], [place]  [address]       Ph: [phone]       Fax: [phone]  RxID:   [id number]    Orders Added: 1)  T-Basic Metabolic Panel [[id number]] 2)  T-TSH [[id number]] 3)  T-T4, Free [[id number]] 4)  T-Magnesium [[id number]] 5)  Est. Patient Level III [[address]25]    Current Allergies (reviewed today): No known allergies

## 2010-07-28 NOTE — Assessment & Plan Note (Signed)
Summary: blood pressure diabetes?/mhf   Vital Signs:  Patient profile:   40 year old Brooke [name] Menstrual status:  regular Height:      62 inches Weight:      258.50 pounds BMI:     47.45 O2 Sat:      99 % on Room air Temp:     97.9 degrees F oral Pulse rate:   Brooke / minute BP sitting:   122 / 80  (right arm) Cuff size:   large  Vitals Entered By: Lucious Groves (Oct 25, 2009 12:02 PM)  O2 Flow:  Room air CC: C/O increased BP, HA, blurry vision, and circulation problems in arms/hands./kb Is Patient Diabetic? No Pain Assessment Patient in pain? no        Primary Care Provider:  Dondra Spry DO  CC:  C/O increased BP, HA, blurry vision, and and circulation problems in arms/hands./kb.  History of Present Illness: 40 year old African American Brooke [name] for followup  she has history of elevated BP without diagnosis of hypertension She has noticed occasional blurry vision and is concerned she may have developed diabetes Increased stress at work,  she is worried about losing her job she is having trouble concentrating at work also having financial issues  Current Medications (verified): 1)  Triazolam 0.125 Mg Tabs (Triazolam) .... 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   Lungs:  normal respiratory effort, normal breath sounds, and no wheezes.   Heart:  normal rate, regular rhythm, and no gallop.   Abdomen:  soft, non-tender, and normal bowel sounds.   Extremities:  trace left pedal edema and trace right pedal edema.   Neurologic:  cranial nerves II-XII intact and gait normal.   Psych:   normally interactive, good eye contact, not anxious appearing, and not depressed appearing.     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   trial of wellbutrin.  Call our office if your symptoms do not  improve or gets worse.   Orders: T-TSH [id number]) T-T4, Free [phone])  Problem # 2:  ABNORMAL GLUCOSE NEC (ICD-790.29) she notes occ blurry vision.   monitor for diabetes Pt counseled on diet and exercise.  Orders: T- Hemoglobin A1C ([id number]) T-Basic Metabolic Panel ([id number])  Complete Medication List: 1)  Triazolam 0.125 Mg Tabs (Triazolam) .... Take 1 tab by mouth at bedtime as needed 2)  Budeprion Xl 150 Mg Xr24h-tab (Bupropion hcl) .... One by mouth once daily  Patient Instructions: 1)  Please schedule a follow-up appointment in 1 month. 2)  Call our office if your symptoms do not  improve or gets worse. 3)  Use wrist splint at bedtime for 2-4 weeks Prescriptions: TRIAZOLAM 0.125 MG TABS (TRIAZOLAM) Take 1 tab by mouth at bedtime as needed  #30 x 2   Entered and Authorized by:   D. Thomos Lemons DO   Signed by:   D. Thomos Lemons DO on 10/25/2009   Method used:   Print then Give to Patient   RxID:   [id number] BUDEPRION XL 150 MG XR24H-TAB (BUPROPION  HCL) one by mouth once daily  #30 x 1   Entered and Authorized by:   D. Thomos Lemons DO   Signed by:   D. Thomos Lemons DO on 10/25/2009   Method used:   Electronically to        [id number]* (retail)       [address] [place], [place]  [address]       Ph: [phone]       Fax: [phone]   RxID:   [id number]

## 2010-09-06 LAB — HM PAP SMEAR: HM Pap smear: NORMAL

## 2010-09-13 ENCOUNTER — Telehealth: Payer: Self-pay | Admitting: Internal Medicine

## 2010-09-16 ENCOUNTER — Telehealth: Payer: Self-pay | Admitting: Internal Medicine

## 2010-09-16 DIAGNOSIS — G4700 Insomnia, unspecified: Secondary | ICD-10-CM

## 2010-09-16 NOTE — Telephone Encounter (Signed)
Triazolam 0.25mg  tablets. Qty 30. Take 1 tablet by mouth every night at bedtime. Last fill 10.26.11

## 2010-09-19 MED ORDER — TRIAZOLAM 0.25 MG PO TABS: 0.2500 mg | ORAL_TABLET | Freq: Every evening | ORAL | Status: DC | PRN

## 2010-09-19 NOTE — Telephone Encounter (Signed)
Ok to refill x 3 

## 2010-09-19 NOTE — Telephone Encounter (Signed)
Rx called to pharmacy

## 2010-09-27 NOTE — Progress Notes (Signed)
Summary: refill--triazolam  Phone Note Refill Request Message from:  Fax from Pharmacy on September 13, 2010 8:32 AM  Refills Requested: Medication #1:  TRIAZOLAM 0.25 MG TABS one by mouth qhs   Dosage confirmed as above?Dosage Confirmed   Supply Requested: 1 month   Last Refilled: 08/30/2010 Next Appointment Scheduled: June 8,12 Dr Artist Pais Initial call taken by: Mervin Kung CMA Duncan Dull),  September 13, 2010 8:32 AM  Follow-up for Phone Call        ok to refill x3 Follow-up by: D. Thomos Lemons DO,  September 13, 2010 6:01 PM  Additional Follow-up for Phone Call Additional follow up Details #1::        Rx called to pharmacy Additional Follow-up by: Glendell Docker CMA,  September 20, 2010 8:11 AM

## 2010-10-17 ENCOUNTER — Telehealth: Payer: Self-pay | Admitting: *Deleted

## 2010-10-17 NOTE — Telephone Encounter (Signed)
I would use monistat over the counter. If she has persistent vaginal discharge I suggest followup with our nurse practitioner or her gynecologist for pelvic exam and wet prep.

## 2010-10-17 NOTE — Telephone Encounter (Signed)
Patient called and left voice message wanting to know if Dr Artist Pais would provide her a rx for a yeast infection without her having to come into the office.

## 2010-10-18 NOTE — Telephone Encounter (Signed)
Call placed to patient at [phone], she was informed per Dr Artist Pais instructions and has verbalized understanding and agrees as instructed

## 2010-11-14 ENCOUNTER — Ambulatory Visit: Payer: Self-pay | Admitting: Family

## 2010-11-14 ENCOUNTER — Encounter: Payer: Self-pay | Admitting: Internal Medicine

## 2010-11-16 ENCOUNTER — Encounter: Payer: Self-pay | Admitting: Family

## 2010-11-16 ENCOUNTER — Ambulatory Visit (INDEPENDENT_AMBULATORY_CARE_PROVIDER_SITE_OTHER): Payer: BC Managed Care – PPO | Admitting: Family

## 2010-11-16 DIAGNOSIS — J329 Chronic sinusitis, unspecified: Secondary | ICD-10-CM

## 2010-11-16 DIAGNOSIS — H6690 Otitis media, unspecified, unspecified ear: Secondary | ICD-10-CM

## 2010-11-16 DIAGNOSIS — H6693 Otitis media, unspecified, bilateral: Secondary | ICD-10-CM

## 2010-11-16 MED ORDER — CEFUROXIME AXETIL 500 MG PO TABS: 500.0000 mg | ORAL_TABLET | Freq: Two times a day (BID) | ORAL | Status: AC

## 2010-11-16 NOTE — Assessment & Plan Note (Signed)
Treat with Ceftin.

## 2010-11-16 NOTE — Progress Notes (Signed)
  Subjective:    Patient ID: Brooke [name], female    DOB: Jun 08, 1971, 40 y.o.   MRN: [id number]  HPI  Brooke [name] is a 40 year old femae who presents with chief complaint of cough and right sided frontal HA.  HA is throbbing constantly- worse with cough or sneeze or bending over.  She reports subjective temp over the weekend.  +Nasal discharge which started as clear and had progressed to a light yellow. Ears feel "stopped up." Sore throat Friday to Saturday, then became scratchy.  Teeth "ached" bilaterally over the weekend. She has been using a 24 hour antihistamine x 4 days without much improvement in her symptoms.     Review of Systems Past Medical History  Diagnosis Date  . Allergy     allergic rhinitis  . Anemia     nos  . Elevated blood pressure reading without diagnosis of hypertension   . Genital herpes   . Migraine     History   Social History  . Marital Status: Single    Spouse Name: N/A    Number of Children: N/A  . Years of Education: N/A   Occupational History  . Not on file.   Social History Main Topics  . Smoking status: Never Smoker   . Smokeless tobacco: Not on file  . Alcohol Use: No  . Drug Use: Not on file  . Sexually Active: Not on file   Other Topics Concern  . Not on file   Social History Narrative  . No narrative on file    No past surgical history on file.  No family history on file.  No Known Allergies  Current Outpatient Prescriptions on File Prior to Visit  Medication Sig Dispense Refill  . buPROPion (WELLBUTRIN XL) 300 MG 24 hr tablet Take 300 mg by mouth daily.        . triazolam (HALCION) 0.25 MG tablet Take 1 tablet (0.25 mg total) by mouth at bedtime as needed.  30 tablet  3    BP 120/90  Pulse 78  Temp(Src) 97.8 F (36.6 C) (Oral)  Resp 16  Ht 5\' 2"  (1.575 m)  Wt 258 lb 0.6 oz (117.046 kg)  BMI 47.20 kg/m2        Objective:   Physical Exam  Constitutional: She appears well-developed and well-nourished.    HENT:  Head: Normocephalic and atraumatic.  Mouth/Throat: Uvula is midline and oropharynx is clear and moist. No oropharyngeal exudate or posterior oropharyngeal erythema.       Mild frontal sinus pressure to palpation Bilateral TM's + erythema, no bulging.    Cardiovascular: Normal rate and regular rhythm.   Pulmonary/Chest: Effort normal and breath sounds normal. No respiratory distress. She has no decreased breath sounds. She has no wheezes. She has no rhonchi.          Assessment & Plan:

## 2010-11-16 NOTE — Assessment & Plan Note (Signed)
Will treat with ceftin 

## 2010-11-16 NOTE — Patient Instructions (Signed)
Call if symptoms worsen or if not improved in 48-72 hours.

## 2010-12-12 ENCOUNTER — Ambulatory Visit: Payer: Self-pay | Admitting: Internal Medicine

## 2011-01-13 ENCOUNTER — Telehealth: Payer: Self-pay | Admitting: Internal Medicine

## 2011-01-13 NOTE — Telephone Encounter (Signed)
Patient states that she had been taking wellbutrin for 1-2 yrs but had adrubtly stopped taking med about a month ago. Patient states that she feels fine but would like to know any side effects of stopping med.

## 2011-01-13 NOTE — Telephone Encounter (Signed)
Left detailed message on cell phone

## 2011-01-13 NOTE — Telephone Encounter (Signed)
Withdrawal may include, headache, fatigue, irritability, or worsening depression and anxiety. I would however expect that she would most likely already experienced these symptoms by now. She should contact our office if she has any worsening depression or anxiety symptoms.

## 2011-01-13 NOTE — Telephone Encounter (Signed)
Please advise 

## 2011-02-08 ENCOUNTER — Ambulatory Visit (INDEPENDENT_AMBULATORY_CARE_PROVIDER_SITE_OTHER): Payer: BC Managed Care – PPO | Admitting: Internal Medicine

## 2011-02-08 ENCOUNTER — Encounter: Payer: Self-pay | Admitting: Internal Medicine

## 2011-02-08 DIAGNOSIS — G4700 Insomnia, unspecified: Secondary | ICD-10-CM

## 2011-02-08 DIAGNOSIS — Z0000 Encounter for general adult medical examination without abnormal findings: Secondary | ICD-10-CM

## 2011-02-08 DIAGNOSIS — Z1239 Encounter for other screening for malignant neoplasm of breast: Secondary | ICD-10-CM

## 2011-02-08 LAB — BASIC METABOLIC PANEL
BUN: 10 mg/dL (ref 6–23)
CO2: 28 mEq/L (ref 19–32)
Calcium: 9.1 mg/dL (ref 8.4–10.5)
Chloride: 104 mEq/L (ref 96–112)
Creat: 0.6 mg/dL (ref 0.50–1.10)
Glucose, Bld: 81 mg/dL (ref 70–99)
Potassium: 4.3 mEq/L (ref 3.5–5.3)
Sodium: 138 mEq/L (ref 135–145)

## 2011-02-08 LAB — URINALYSIS
Bilirubin Urine: NEGATIVE
Glucose, UA: NEGATIVE mg/dL
Hgb urine dipstick: NEGATIVE
Ketones, ur: NEGATIVE mg/dL
Leukocytes, UA: NEGATIVE
Nitrite: NEGATIVE
Protein, ur: NEGATIVE mg/dL
Specific Gravity, Urine: 1.019 (ref 1.005–1.030)
Urobilinogen, UA: 0.2 mg/dL (ref 0.0–1.0)
pH: 7 (ref 5.0–8.0)

## 2011-02-08 LAB — CBC
HCT: 37.4 % (ref 36.0–46.0)
Hemoglobin: 11.8 g/dL — ABNORMAL LOW (ref 12.0–15.0)
MCH: 24.7 pg — ABNORMAL LOW (ref 26.0–34.0)
MCHC: 31.6 g/dL (ref 30.0–36.0)
MCV: 78.4 fL (ref 78.0–100.0)
Platelets: 412 10*3/uL — ABNORMAL HIGH (ref 150–400)
RBC: 4.77 MIL/uL (ref 3.87–5.11)
RDW: 15.2 % (ref 11.5–15.5)
WBC: 6.2 10*3/uL (ref 4.0–10.5)

## 2011-02-08 LAB — LIPID PANEL
Cholesterol: 187 mg/dL (ref 0–200)
HDL: 57 mg/dL (ref >39)
LDL Cholesterol: 122 mg/dL — ABNORMAL HIGH (ref 0–99)
Total CHOL/HDL Ratio: 3.3 Ratio
Triglycerides: 41 mg/dL (ref <150)
VLDL: 8 mg/dL (ref 0–40)

## 2011-02-08 LAB — HEPATIC FUNCTION PANEL
ALT: 10 U/L (ref 0–35)
AST: 14 U/L (ref 0–37)
Albumin: 3.7 g/dL (ref 3.5–5.2)
Alkaline Phosphatase: 88 U/L (ref 39–117)
Bilirubin, Direct: 0.1 mg/dL (ref 0.0–0.3)
Indirect Bilirubin: 0.3 mg/dL (ref 0.0–0.9)
Total Bilirubin: 0.4 mg/dL (ref 0.3–1.2)
Total Protein: 7.4 g/dL (ref 6.0–8.3)

## 2011-02-08 LAB — HEMOGLOBIN A1C
Hgb A1c MFr Bld: 6 % — ABNORMAL HIGH (ref <5.7)
Mean Plasma Glucose: 126 mg/dL — ABNORMAL HIGH (ref <117)

## 2011-02-08 LAB — T4, FREE: Free T4: 0.89 ng/dL (ref 0.80–1.80)

## 2011-02-08 LAB — TSH: TSH: 0.764 u[IU]/mL (ref 0.350–4.500)

## 2011-02-08 MED ORDER — FUROSEMIDE 20 MG PO TABS: ORAL_TABLET | ORAL | Status: DC

## 2011-02-08 MED ORDER — TRIAZOLAM 0.25 MG PO TABS: 0.2500 mg | ORAL_TABLET | Freq: Every evening | ORAL | Status: DC | PRN

## 2011-02-08 NOTE — Progress Notes (Signed)
  Subjective:    Patient ID: Brooke [name], female    DOB: 12/05/1970, 40 y.o.   MRN: [id number]  HPI Pt presents to clinic for annual physical. utd with pap smears reportedly nl through gyn. Had initial mammogram ~2 years ago with mild abnormality not thought to be suspicious.  H/o anxiety previously on wellbutrin. Was able to stop medication in June after changing to a less stressful job. Insomnia now also improved but takes halcion on a rare sparing basis without evidence of addiction or tolerance. Has intermittent bilateral lower leg swelling sometimes painful. No calf swelling/pain/tenderness and no known risks for thromboembolic dz. Has unintended wt gain since job change. No other complaint.  Reviewed pmh, medications and allergies.    Review of Systems see hpi     Objective:   Physical Exam  Nursing note and vitals reviewed. Constitutional: She appears well-developed and well-nourished. No distress.  HENT:  Head: Normocephalic and atraumatic.  Right Ear: Tympanic membrane, external ear and ear canal normal.  Left Ear: Tympanic membrane, external ear and ear canal normal.  Nose: Nose normal.  Mouth/Throat: Oropharynx is clear and moist. No oropharyngeal exudate.  Eyes: Conjunctivae and EOM are normal. Pupils are equal, round, and reactive to light. Right eye exhibits no discharge. Left eye exhibits no discharge. No scleral icterus.  Neck: Neck supple. No thyromegaly present.  Cardiovascular: Normal rate, regular rhythm, normal heart sounds and intact distal pulses.  Exam reveals no gallop and no friction rub.   No murmur heard. Pulmonary/Chest: Effort normal and breath sounds normal. No respiratory distress. She has no wheezes. She has no rales.  Abdominal: Soft. Bowel sounds are normal. She exhibits no distension and no mass. There is no tenderness. There is no rebound and no guarding.  Musculoskeletal:       No le edema. No calf swelling, palpable cords or tenderness.    Lymphadenopathy:    She has no cervical adenopathy.  Neurological: She is alert.  Skin: Skin is warm and dry. She is not diaphoretic. No cyanosis. Nails show no clubbing.  Psychiatric: She has a normal mood and affect.          Assessment & Plan:

## 2011-02-08 NOTE — Assessment & Plan Note (Signed)
Nl exam. Obtain cpe labs. Schedule annual screening mammogram. Pap smears per gyn. Discussed dietary modification, regular aerobic exercise and weight loss. Attempt lasix 20mg  po qam prn leg swelling on a sparing basis. Rf halcion for prn sparing use. Aware of potential for addiction and/or tolerance.

## 2011-03-29 ENCOUNTER — Ambulatory Visit (INDEPENDENT_AMBULATORY_CARE_PROVIDER_SITE_OTHER): Payer: BC Managed Care – PPO | Admitting: Internal Medicine

## 2011-03-29 ENCOUNTER — Encounter: Payer: Self-pay | Admitting: Internal Medicine

## 2011-03-29 ENCOUNTER — Ambulatory Visit
Admission: RE | Admit: 2011-03-29 | Discharge: 2011-03-29 | Disposition: A | Discharge status: Home / Self Care | Payer: BC Managed Care – PPO | Source: Ambulatory Visit | Attending: Obstetrics and Gynecology | Admitting: Obstetrics and Gynecology

## 2011-03-29 ENCOUNTER — Other Ambulatory Visit: Payer: Self-pay | Admitting: Obstetrics and Gynecology

## 2011-03-29 DIAGNOSIS — Z1231 Encounter for screening mammogram for malignant neoplasm of breast: Secondary | ICD-10-CM

## 2011-03-29 DIAGNOSIS — R030 Elevated blood-pressure reading, without diagnosis of hypertension: Secondary | ICD-10-CM

## 2011-03-29 DIAGNOSIS — E669 Obesity, unspecified: Secondary | ICD-10-CM

## 2011-03-29 NOTE — Patient Instructions (Signed)
Call our office with your blood pressure readings in one month Start regular aerobic exercise program Follow low salt 2.5 gram/day diet

## 2011-03-29 NOTE — Assessment & Plan Note (Signed)
Diet and exercise regimen reviewed

## 2011-03-29 NOTE — Progress Notes (Signed)
  Subjective:    Patient ID: Brooke [name], female    DOB: 1970/10/28, 40 y.o.   MRN: [id number]  HPI  39 y/o AA female reports sporadic elevated BP readings.  She denies unusual stress.  She is not sure whether elevated readings taken with large cuff.   She was at ortho office - bp readings was 168/104.  She is not taking any OTC meds that would elevate her BP.  Elevated bp is asymptomatic.  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  She notes polyuria.  Her a1c was checked this summer - normal  Past Medical History  Diagnosis Date  . Allergy     allergic rhinitis  . Anemia     nos  . Elevated blood pressure reading without diagnosis of hypertension   . Genital herpes   . Migraine     History   Social History  . Marital Status: Single    Spouse Name: N/A    Number of Children: N/A  . Years of Education: N/A   Occupational History  . Not on file.   Social History Main Topics  . Smoking status: Never Smoker   . Smokeless tobacco: Never Used  . Alcohol Use: No  . Drug Use: No  . Sexually Active: Not on file   Other Topics Concern  . Not on file   Social History Narrative  . No narrative on file    No past surgical history on file.  No family history on file.  No Known Allergies  Current Outpatient Prescriptions on File Prior to Visit  Medication Sig Dispense Refill  . triazolam (HALCION) 0.25 MG tablet Take 1 tablet (0.25 mg total) by mouth at bedtime as needed.  30 tablet  2  . Triprolidine-Pseudoephedrine (ANTIHISTAMINE PO) Take 1 tablet by mouth daily.          BP 130/90  Pulse 88  Temp(Src) 98.3 F (36.8 C) (Oral)  Ht 5\' 2"  (1.575 m)  Wt 268 lb (121.564 kg)  BMI 49.02 kg/m2       Objective:   Physical Exam   Constitutional: pleasant, NAD Neck: Normal range of motion. Neck  supple. No thyromegaly present. No carotid bruit Cardiovascular: Normal rate, regular rhythm and normal heart sounds.  Exam reveals no gallop and no friction rub.  No murmur heard. Pulmonary/Chest: Effort normal and breath sounds normal.  No wheezes. No rales.  Abdominal: Soft. Bowel sounds are normal. No mass. There is no tenderness.  Neurological: Alert. No cranial nerve deficit.  Skin: Skin is warm and dry.  Psychiatric: Normal mood and affect. Behavior is normal.      Assessment & Plan:

## 2011-03-29 NOTE — Assessment & Plan Note (Signed)
BP with large manual cuff was 122/84.  I suggest she monitor her BP at home and call office results in one month.  I urged low sat diet and regular aerobic exercise.

## 2011-04-05 ENCOUNTER — Telehealth: Payer: Self-pay | Admitting: Internal Medicine

## 2011-04-05 DIAGNOSIS — G4700 Insomnia, unspecified: Secondary | ICD-10-CM

## 2011-04-05 MED ORDER — TRIAZOLAM 0.25 MG PO TABS: 0.2500 mg | ORAL_TABLET | Freq: Every evening | ORAL | Status: DC | PRN

## 2011-04-05 NOTE — Telephone Encounter (Signed)
I did not call this in even though it looks like it on the med list

## 2011-04-05 NOTE — Telephone Encounter (Signed)
Refill- triazolam 0.25mg  tablets. Take one tablet by mouth every night at bedtime. Qty 30. Last fill 10.26.11

## 2011-04-07 MED ORDER — TRIAZOLAM 0.25 MG PO TABS: 0.2500 mg | ORAL_TABLET | Freq: Every evening | ORAL | Status: DC | PRN

## 2011-04-07 NOTE — Telephone Encounter (Signed)
rx called in, pt aware 

## 2011-04-07 NOTE — Telephone Encounter (Signed)
Ok to refill x 3 

## 2011-06-30 ENCOUNTER — Encounter: Payer: Self-pay | Admitting: Internal Medicine

## 2011-06-30 ENCOUNTER — Ambulatory Visit (INDEPENDENT_AMBULATORY_CARE_PROVIDER_SITE_OTHER): Payer: BC Managed Care – PPO | Admitting: Internal Medicine

## 2011-06-30 VITALS — BP 132/74 | HR 76 | Temp 97.4°F | Wt 270.0 lb

## 2011-06-30 DIAGNOSIS — R0600 Dyspnea, unspecified: Secondary | ICD-10-CM | POA: Insufficient documentation

## 2011-06-30 DIAGNOSIS — R0989 Other specified symptoms and signs involving the circulatory and respiratory systems: Secondary | ICD-10-CM

## 2011-06-30 DIAGNOSIS — E669 Obesity, unspecified: Secondary | ICD-10-CM

## 2011-06-30 NOTE — Assessment & Plan Note (Addendum)
41 year old [place] American female with unexplained progressive dyspnea.  I suspect symptoms may be from obesity and deconditioning.   She would like to rule out cardiac ischemia/cardiomyopathy before she starts exercise program. Refer to cardiology for further cardiac testing.  Repeat CBCD, BMET, and TSH

## 2011-06-30 NOTE — Assessment & Plan Note (Signed)
Refer for nutrition counseling. Discussed weight loss strategies.

## 2011-06-30 NOTE — Progress Notes (Signed)
  Subjective:    Patient ID: Brooke [name], female    DOB: 20-Aug-1970, 41 y.o.   MRN: [id number]  HPI  41 year old African [place] female with history of elevated blood pressure without diagnosis of hypertension and obesity complains of progressive dyspnea and exertion. She denies any cough or congestion. She has noticed shortness of breath with climbing stairs or even rushing to the bathroom. She denies chest pain or chest pressure.    Chart review notes mild anemia in August 2012.    She has gained weight since previous office visit.  She has struggles with obesity for years.  She requests nutrition counseling.  Review of Systems Negative for chest pain  Past Medical History  Diagnosis Date  . Allergy     allergic rhinitis  . Anemia     nos  . Elevated blood pressure reading without diagnosis of hypertension   . Genital herpes   . Migraine     History   Social History  . Marital Status: Single    Spouse Name: N/A    Number of Children: N/A  . Years of Education: N/A   Occupational History  . Not on file.   Social History Main Topics  . Smoking status: Never Smoker   . Smokeless tobacco: Never Used  . Alcohol Use: No  . Drug Use: No  . Sexually Active: Not on file   Other Topics Concern  . Not on file   Social History Narrative  . No narrative on file    No past surgical history on file.  No family history on file.  No Known Allergies  Current Outpatient Prescriptions on File Prior to Visit  Medication Sig Dispense Refill  . triazolam (HALCION) 0.25 MG tablet Take 1 tablet (0.25 mg total) by mouth at bedtime as needed.  30 tablet  3    BP 132/74  Pulse 76  Temp(Src) 97.4 F (36.3 C) (Oral)  Wt 270 lb (122.471 kg)  SpO2 98%      Objective:   Physical Exam  Constitutional: She is oriented to person, place, and time. She appears well-developed and well-nourished.  HENT:  Head: Normocephalic and atraumatic.  Cardiovascular: Normal rate,  regular rhythm and normal heart sounds.   No murmur heard. Pulmonary/Chest: Effort normal and breath sounds normal. No respiratory distress. She has no wheezes.  Neurological: She is oriented to person, place, and time.  Psychiatric: She has a normal mood and affect. Her behavior is normal.          Assessment & Plan:

## 2011-07-01 LAB — CBC WITH DIFFERENTIAL/PLATELET
Basophils Absolute: 0 10*3/uL (ref 0.0–0.1)
Basophils Relative: 1 % (ref 0–1)
Eosinophils Absolute: 0.1 10*3/uL (ref 0.0–0.7)
Eosinophils Relative: 2 % (ref 0–5)
HCT: 36.8 % (ref 36.0–46.0)
Hemoglobin: 11.5 g/dL — ABNORMAL LOW (ref 12.0–15.0)
Lymphocytes Relative: 33 % (ref 12–46)
Lymphs Abs: 2.5 10*3/uL (ref 0.7–4.0)
MCH: 24 pg — ABNORMAL LOW (ref 26.0–34.0)
MCHC: 31.3 g/dL (ref 30.0–36.0)
MCV: 76.8 fL — ABNORMAL LOW (ref 78.0–100.0)
Monocytes Absolute: 0.5 10*3/uL (ref 0.1–1.0)
Monocytes Relative: 7 % (ref 3–12)
Neutro Abs: 4.3 10*3/uL (ref 1.7–7.7)
Neutrophils Relative %: 58 % (ref 43–77)
Platelets: 360 10*3/uL (ref 150–400)
RBC: 4.79 MIL/uL (ref 3.87–5.11)
RDW: 15.1 % (ref 11.5–15.5)
WBC: 7.4 10*3/uL (ref 4.0–10.5)

## 2011-07-01 LAB — BASIC METABOLIC PANEL
BUN: 14 mg/dL (ref 6–23)
CO2: 25 mEq/L (ref 19–32)
Calcium: 9.2 mg/dL (ref 8.4–10.5)
Chloride: 103 mEq/L (ref 96–112)
Creat: 0.66 mg/dL (ref 0.50–1.10)
Glucose, Bld: 77 mg/dL (ref 70–99)
Potassium: 4.7 mEq/L (ref 3.5–5.3)
Sodium: 137 mEq/L (ref 135–145)

## 2011-07-01 LAB — TSH: TSH: 1.023 u[IU]/mL (ref 0.350–4.500)

## 2011-07-20 ENCOUNTER — Institutional Professional Consult (permissible substitution): Payer: BC Managed Care – PPO | Admitting: Cardiovascular Disease

## 2011-07-24 ENCOUNTER — Institutional Professional Consult (permissible substitution): Payer: BC Managed Care – PPO | Admitting: Cardiovascular Disease

## 2011-07-24 ENCOUNTER — Ambulatory Visit: Payer: BC Managed Care – PPO | Admitting: *Deleted

## 2011-08-03 ENCOUNTER — Ambulatory Visit: Payer: BC Managed Care – PPO | Admitting: Internal Medicine

## 2011-08-07 ENCOUNTER — Other Ambulatory Visit: Payer: Self-pay | Admitting: Family Medicine

## 2011-10-21 ENCOUNTER — Other Ambulatory Visit: Payer: Self-pay | Admitting: Obstetrics and Gynecology

## 2011-10-25 NOTE — Telephone Encounter (Signed)
Rx for Valtex called to pharm on file per protocol.

## 2011-12-05 ENCOUNTER — Telehealth: Payer: Self-pay | Admitting: Internal Medicine

## 2011-12-05 NOTE — Telephone Encounter (Signed)
Ok to write letter approving regular exercise program for obesity

## 2011-12-05 NOTE — Telephone Encounter (Signed)
Letter up front ready for p/u, pt aware 

## 2011-12-05 NOTE — Telephone Encounter (Signed)
Patient called stating that she would like the MD to write a letter stating that she need to go to the [facility] to improve her health by losing weight. This way patient is able to pay for and use only the services needed. Please advise.

## 2011-12-18 ENCOUNTER — Ambulatory Visit: Payer: BC Managed Care – PPO | Admitting: Internal Medicine

## 2011-12-19 ENCOUNTER — Encounter: Payer: Self-pay | Admitting: Internal Medicine

## 2011-12-19 ENCOUNTER — Ambulatory Visit: Payer: BC Managed Care – PPO | Admitting: Internal Medicine

## 2011-12-19 ENCOUNTER — Ambulatory Visit (INDEPENDENT_AMBULATORY_CARE_PROVIDER_SITE_OTHER): Payer: BC Managed Care – PPO | Admitting: Internal Medicine

## 2011-12-19 VITALS — BP 118/82 | Temp 98.2°F | Wt 270.0 lb

## 2011-12-19 DIAGNOSIS — R030 Elevated blood-pressure reading, without diagnosis of hypertension: Secondary | ICD-10-CM

## 2011-12-19 DIAGNOSIS — R350 Frequency of micturition: Secondary | ICD-10-CM

## 2011-12-19 LAB — POCT URINALYSIS DIPSTICK
Bilirubin, UA: NEGATIVE
Glucose, UA: NEGATIVE
Ketones, UA: NEGATIVE
Leukocytes, UA: NEGATIVE
Nitrite, UA: NEGATIVE
Protein, UA: NEGATIVE
Spec Grav, UA: 1.015
Urobilinogen, UA: 0.2
pH, UA: 7

## 2011-12-19 NOTE — Assessment & Plan Note (Signed)
41 year old [place] American female with complaint of urinary frequency. She has no evidence of infection. Her blood glucose is within normal range. Her symptoms are probably related to her increased fluid intake and caffeine use.

## 2011-12-19 NOTE — Progress Notes (Signed)
  Subjective:    Patient ID: Brooke [name], female    DOB: 1970/12/08, 41 y.o.   MRN: [id number]  HPI  41 year old African American female with history of elevated blood pressure that diagnosed hypertension, and obesity complains of increased urinary frequency over the last several weeks. She denies any dysuria or foul odor to her urine. She has been drinking more water than usual as part of her weight loss program.  Obesity-she never followed up with nutritionist. She is working with her family members to start an exercise program and low carb diet.  She has started using green coffee bean extract.  She is also thinking about using a protein shake supplement.  Review of Systems Negative for chest pain  Past Medical History  Diagnosis Date  . Allergy     allergic rhinitis  . Anemia     nos  . Elevated blood pressure reading without diagnosis of hypertension   . Genital herpes   . Migraine     History   Social History  . Marital Status: Single    Spouse Name: N/A    Number of Children: N/A  . Years of Education: N/A   Occupational History  . Not on file.   Social History Main Topics  . Smoking status: Never Smoker   . Smokeless tobacco: Never Used  . Alcohol Use: No  . Drug Use: No  . Sexually Active: Not on file   Other Topics Concern  . Not on file   Social History Narrative  . No narrative on file    No past surgical history on file.  No family history on file.  No Known Allergies  No current outpatient prescriptions on file prior to visit.    BP 118/82  Temp 98.2 F (36.8 C) (Oral)  Wt 270 lb (122.471 kg)       Objective:   Physical Exam  Constitutional: She is oriented to person, place, and time. She appears well-developed and well-nourished. No distress.  HENT:  Head: Normocephalic and atraumatic.  Cardiovascular: Normal rate and normal heart sounds.   Pulmonary/Chest: Effort normal and breath sounds normal. She has no wheezes.    Musculoskeletal: She exhibits no edema.  Neurological: She is alert and oriented to person, place, and time.  Psychiatric: She has a normal mood and affect. Her behavior is normal.          Assessment & Plan:

## 2011-12-19 NOTE — Assessment & Plan Note (Signed)
BP normal off medications.  I encouraged patient to begin her exercise program.  Counseled patient on dietary changes.

## 2012-01-26 ENCOUNTER — Emergency Department (HOSPITAL_COMMUNITY)
Admission: EM | Admit: 2012-01-26 | Discharge: 2012-01-26 | Disposition: A | Discharge status: Home / Self Care | Payer: No Typology Code available for payment source | Attending: Emergency Medicine | Admitting: Emergency Medicine

## 2012-01-26 ENCOUNTER — Encounter (HOSPITAL_COMMUNITY): Payer: Self-pay | Admitting: *Deleted

## 2012-01-26 DIAGNOSIS — D649 Anemia, unspecified: Secondary | ICD-10-CM | POA: Insufficient documentation

## 2012-01-26 DIAGNOSIS — IMO0001 Reserved for inherently not codable concepts without codable children: Secondary | ICD-10-CM | POA: Insufficient documentation

## 2012-01-26 DIAGNOSIS — Z043 Encounter for examination and observation following other accident: Secondary | ICD-10-CM | POA: Insufficient documentation

## 2012-01-26 DIAGNOSIS — T148XXA Other injury of unspecified body region, initial encounter: Secondary | ICD-10-CM

## 2012-01-26 MED ORDER — CYCLOBENZAPRINE HCL 10 MG PO TABS: 10.0000 mg | ORAL_TABLET | Freq: Two times a day (BID) | ORAL | Status: AC | PRN

## 2012-01-26 MED ORDER — IBUPROFEN 800 MG PO TABS
800.0000 mg | ORAL_TABLET | Freq: Once | ORAL | Status: AC
  Administered 2012-01-26: 800 mg via ORAL
  Filled 2012-01-26: qty 1
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
## 2012-01-27 NOTE — ED Provider Notes (Signed)
History/physical exam/procedure(s) were performed by non-physician practitioner and as supervising physician I was immediately available for consultation/collaboration. I have reviewed all notes and am in agreement with care and plan.   Hilario Quarry, MD 01/27/12 [phone]

## 2012-06-10 ENCOUNTER — Ambulatory Visit (INDEPENDENT_AMBULATORY_CARE_PROVIDER_SITE_OTHER): Payer: BC Managed Care – PPO | Admitting: Internal Medicine

## 2012-06-10 VITALS — BP 142/90 | HR 72 | Temp 98.2°F | Wt 272.0 lb

## 2012-06-10 DIAGNOSIS — I10 Essential (primary) hypertension: Secondary | ICD-10-CM

## 2012-06-10 MED ORDER — LOSARTAN POTASSIUM 50 MG PO TABS: 50.0000 mg | ORAL_TABLET | Freq: Every day | ORAL | Status: DC

## 2012-06-10 NOTE — Patient Instructions (Signed)
Please return in 2 weeks for the following blood tests: BMET - 401.9 Contact us via MyChart about your home blood pressure readings in 1 month

## 2012-06-10 NOTE — Progress Notes (Signed)
  Subjective:    Patient ID: Brooke [name], female    DOB: 05-21-1971, 41 y.o.   MRN: [id number]  HPI  41 year old African American female with history of obesity and elevated blood pressure without diagnosis of hypertension for follow up. Patient has been monitoring blood pressure at home for last several months. Her systolic blood pressure readings are in the high 130s to low 140s and diastolic readings are frequently in the 90s. She has mild associated headache. Otherwise she is asymptomatic. She notes increase stresses at work. She is looking for a new job.  Review of Systems Negative for chest pain or shortness of breath  Past Medical History  Diagnosis Date  . Allergy     allergic rhinitis  . Anemia     nos  . Elevated blood pressure reading without diagnosis of hypertension   . Genital herpes   . Migraine     History   Social History  . Marital Status: Single    Spouse Name: N/A    Number of Children: N/A  . Years of Education: N/A   Occupational History  . Not on file.   Social History Main Topics  . Smoking status: Never Smoker   . Smokeless tobacco: Never Used  . Alcohol Use: No  . Drug Use: No  . Sexually Active: Not on file   Other Topics Concern  . Not on file   Social History Narrative  . No narrative on file    Past Surgical History  Procedure Date  . Ankle surgery     No family history on file.  No Known Allergies  Current Outpatient Prescriptions on File Prior to Visit  Medication Sig Dispense Refill  . losartan (COZAAR) 50 MG tablet Take 1 tablet (50 mg total) by mouth daily.  90 tablet  1    BP 142/90  Pulse 72  Temp 98.2 F (36.8 C) (Oral)  Wt 272 lb (123.378 kg)       Objective:   Physical Exam  Constitutional: She is oriented to person, place, and time. She appears well-developed and well-nourished.  Cardiovascular: Normal rate, regular rhythm and normal heart sounds.   Pulmonary/Chest: Effort normal and breath sounds  normal. She has no wheezes.  Musculoskeletal: She exhibits no edema.  Neurological: She is alert and oriented to person, place, and time.  Psychiatric: She has a normal mood and affect. Her behavior is normal.          Assessment & Plan:

## 2012-06-10 NOTE — Assessment & Plan Note (Signed)
Blood pressure trending upward. She has stage I hypertension. Start losartan 50 mg once daily. Patient encouraged to follow a low-salt diet and exercise regularly. Monitor electrolytes and kidney function within 2 weeks. Patient will call with her blood pressure readings within one month. Reassess in 6 months.  BP: 142/90 mmHg

## 2012-06-24 ENCOUNTER — Other Ambulatory Visit (INDEPENDENT_AMBULATORY_CARE_PROVIDER_SITE_OTHER): Payer: BC Managed Care – PPO

## 2012-06-24 DIAGNOSIS — I10 Essential (primary) hypertension: Secondary | ICD-10-CM

## 2012-06-24 LAB — BASIC METABOLIC PANEL
BUN: 8 mg/dL (ref 6–23)
CO2: 32 mEq/L (ref 19–32)
Calcium: 8.6 mg/dL (ref 8.4–10.5)
Chloride: 102 mEq/L (ref 96–112)
Creatinine, Ser: 0.7 mg/dL (ref 0.4–1.2)
GFR: 120.04 mL/min (ref >60.00)
Glucose, Bld: 97 mg/dL (ref 70–99)
Potassium: 3.6 mEq/L (ref 3.5–5.1)
Sodium: 141 mEq/L (ref 135–145)

## 2012-08-27 ENCOUNTER — Telehealth: Payer: Self-pay | Admitting: Obstetrics and Gynecology

## 2012-08-27 MED ORDER — VALACYCLOVIR HCL 500 MG PO TABS: ORAL_TABLET | ORAL | Status: DC
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
## 2012-08-27 NOTE — Telephone Encounter (Signed)
Pt aware rx for Valtrex e-pres to pharm on file.

## 2012-09-09 ENCOUNTER — Other Ambulatory Visit: Payer: Self-pay

## 2012-09-09 DIAGNOSIS — Z1231 Encounter for screening mammogram for malignant neoplasm of breast: Secondary | ICD-10-CM

## 2012-09-25 ENCOUNTER — Ambulatory Visit
Admission: RE | Admit: 2012-09-25 | Discharge: 2012-09-25 | Disposition: A | Discharge status: Home / Self Care | Payer: BC Managed Care – PPO | Source: Ambulatory Visit

## 2012-09-25 DIAGNOSIS — Z1231 Encounter for screening mammogram for malignant neoplasm of breast: Secondary | ICD-10-CM

## 2012-12-17 ENCOUNTER — Encounter: Payer: Self-pay | Admitting: Internal Medicine

## 2012-12-17 ENCOUNTER — Telehealth: Payer: Self-pay | Admitting: Gastroenterology

## 2012-12-17 ENCOUNTER — Ambulatory Visit (INDEPENDENT_AMBULATORY_CARE_PROVIDER_SITE_OTHER): Payer: BC Managed Care – PPO | Admitting: Family Medicine

## 2012-12-17 ENCOUNTER — Encounter: Payer: Self-pay | Admitting: Family Medicine

## 2012-12-17 ENCOUNTER — Encounter: Payer: Self-pay | Admitting: Physician Assistant

## 2012-12-17 ENCOUNTER — Other Ambulatory Visit (INDEPENDENT_AMBULATORY_CARE_PROVIDER_SITE_OTHER): Payer: BC Managed Care – PPO

## 2012-12-17 ENCOUNTER — Ambulatory Visit (INDEPENDENT_AMBULATORY_CARE_PROVIDER_SITE_OTHER): Payer: BC Managed Care – PPO | Admitting: Physician Assistant

## 2012-12-17 VITALS — BP 128/80 | Temp 98.1°F | Wt 269.0 lb

## 2012-12-17 VITALS — BP 112/74 | HR 80 | Ht 62.0 in | Wt 269.0 lb

## 2012-12-17 DIAGNOSIS — D649 Anemia, unspecified: Secondary | ICD-10-CM

## 2012-12-17 DIAGNOSIS — K625 Hemorrhage of anus and rectum: Secondary | ICD-10-CM

## 2012-12-17 LAB — IBC PANEL
Iron: 41 ug/dL — ABNORMAL LOW (ref 42–145)
Saturation Ratios: 11.2 % — ABNORMAL LOW (ref 20.0–50.0)
Transferrin: 260.5 mg/dL (ref 212.0–360.0)

## 2012-12-17 LAB — FERRITIN: Ferritin: 22.8 ng/mL (ref 10.0–291.0)

## 2012-12-17 LAB — POCT HEMOGLOBIN: Hemoglobin: 11.3 g/dL — AB (ref 12.2–16.2)

## 2012-12-17 MED ORDER — MOVIPREP 100 G PO SOLR: 1.0000 | Freq: Once | ORAL | Status: DC

## 2012-12-17 NOTE — Progress Notes (Signed)
Agree with Ms. Esterwood's assessment and plan. Carl E. Gessner, MD, FACG   

## 2012-12-17 NOTE — Telephone Encounter (Signed)
Darcey Nora has handled.

## 2012-12-17 NOTE — Progress Notes (Signed)
Subjective:    Patient ID: Brooke [name], female    DOB: 1971/04/06, 42 y.o.   MRN: [id number]  HPI Brooke [name] is a pleasant 42 year old African female new to GI today referred by Dr. Herold Harms, for evaluation of rectal bleeding. Patient has history of asthma, migraine headaches, obesity. She has had a mild microcytic anemia, which she believes had been attributed to menorrhagia has not been treated with any iron. She states that she had an IUD/Mirena placed a couple of months ago and that she is now having light her menstrual cycles though more prolonged . Patient had onset this past Sunday with rectal bleeding. She says she had a normal bowel movement without any straining or discomfort and then a couple of hours later noticed rectal bleeding which was bright red and on the tissue. She says she continued to have small amounts of oozing of bright red blood throughout the day Sunday and into yesterday. She wore a pad and says that the bleeding has slowed way down. She has not had any associated rectal discomfort pressure or pruritus etc. She says she had noticed some mild lower abdominal cramping but this felt more GYN related. Otherwise no abnormal pain and no recent changes in her bowel movements. Family history is negative for colon cancer polyps Patient had hemoglobin of 11.3 today by fingerstick, hemoglobin in January of 2014 was 11.5 and MCV was 76.8. She was documented to be Hemoccult-positive today without any obvious hemorrhoids on rectal exam    Review of Systems  Constitutional: Negative.   HENT: Negative.   Eyes: Negative.   Respiratory: Negative.   Cardiovascular: Negative.   Gastrointestinal: Positive for blood in stool and anal bleeding.  Endocrine: Negative.   Genitourinary: Negative.   Musculoskeletal: Negative.   Skin: Negative.   Allergic/Immunologic: Negative.   Neurological: Negative.   Hematological: Negative.   Psychiatric/Behavioral: Negative.    Outpatient  Prescriptions Prior to Visit  Medication Sig Dispense Refill  . Multiple Vitamin (MULTIVITAMIN) tablet Take 1 tablet by mouth daily.      . valACYclovir (VALTREX) 500 MG tablet Take 1 tablet by mouth every 12 hours x 3 days  6 tablet  2  . losartan (COZAAR) 50 MG tablet Take 1 tablet (50 mg total) by mouth daily.  90 tablet  1   No facility-administered medications prior to visit.   No Known Allergies     Patient Active Problem List   Diagnosis Date Noted  . Annual physical exam 02/08/2011  . MUSCLE FASCICULATIONS 06/06/2010  . Unspecified essential hypertension 11/29/2008  . INSOMNIA 11/27/2008  . PARESTHESIA 06/04/2008  . MIGRAINE HEADACHE 01/03/2008  . ABNORMAL GLUCOSE NEC 08/30/2007  . OBESITY 05/17/2007  . ANEMIA-NOS 05/17/2007  . ASTHMA 05/17/2007  . ANEMIA, HX OF 05/17/2007   History   Social History Narrative  . No narrative on file    Objective:   Physical Exam Well-developed obese African American female, quite pleasant. Blood pressure 112/74 pulse 80 height 5 foot 2 weight 269. HEENT; nontraumatic normocephalic EOMI PERRLA sclera anicteric,Neck; Supple no JVD, Cardiovascular; regular rate and rhythm with S1-S2 no murmur or gallop, Pulmonary; clear bilaterally, Abdomen; soft, nontender, nondistended, bowel sounds are active there is no palpable mass or hepatosplenomegaly, Rectal; exam not repeated this was done this morning per primary care and patient was Hemoccult-positive with no lesion noted, and nontender on exam. Extremities; no clubbing cyanosis or edema she does have a brace on her right ankle., Psych; mood and affect normal  and appropriate.        Assessment & Plan:  #42 42 year old female with new onset of rectal bleeding with bright red blood, small bowel. Etiology of the bleeding is not clear, bleeding could be secondary to internal hemorrhoids however cannot rule out proctitis polyps or occult lesion #2 microcytic anemia-probably iron deficient #3  obesity-BMI of 49 #4 history of asthma  Plan; Patient is scheduled for colonoscopy with Dr. Benjamine Mola was discussed in detail with the patient and she is agreeable to proceed Check iron studies today

## 2012-12-17 NOTE — Progress Notes (Signed)
Chief Complaint  Patient presents with  . Rectal Bleeding    HPI:  42 yo F patient of Dr. Artist Pais, here for acute visit for "rectal bleeding": -started: 2 days ago -symptoms: after soft and normal BM saw a small streak of blood of TP 2 days ago - throughout the day saw more blood on TP and passed a few clots, this continued for the last 2 days with some BRB on stools and after normal BMs yesterday, amount c/w light period and had to wear pad, lighter today -denies: abd pain, rectal pain, diarrhea, vomiting, fevers, weight loss  -hx of: denies any hx of hemorrhoids or rectal bleeding, hx of anemia - has had it on and off since she was a teenager - thinks from menorrhagia, now on mirena -no FH GI disorders, IBD or colon cancer  ROS: See pertinent positives and negatives per HPI.  Past Medical History  Diagnosis Date  . Allergy     allergic rhinitis  . Anemia     nos  . Elevated blood pressure reading without diagnosis of hypertension   . Genital herpes   . Migraine     No family history on file.  History   Social History  . Marital Status: Single    Spouse Name: N/A    Number of Children: N/A  . Years of Education: N/A   Social History Main Topics  . Smoking status: Never Smoker   . Smokeless tobacco: Never Used  . Alcohol Use: No  . Drug Use: No  . Sexually Active: None   Other Topics Concern  . None   Social History Narrative  . None    Current outpatient prescriptions:Multiple Vitamin (MULTIVITAMIN) tablet, Take 1 tablet by mouth daily., Disp: , Rfl: ;  valACYclovir (VALTREX) 500 MG tablet, Take 1 tablet by mouth every 12 hours x 3 days, Disp: 6 tablet, Rfl: 2;  losartan (COZAAR) 50 MG tablet, Take 1 tablet (50 mg total) by mouth daily., Disp: 90 tablet, Rfl: 1  EXAM:  Filed Vitals:   12/17/12 1001  BP: 128/80  Temp: 98.1 F (36.7 C)    Body mass index is 49.19 kg/(m^2).  GENERAL: vitals reviewed and listed above, alert, oriented, appears well hydrated and  in no acute distress  HEENT: atraumatic, conjunttiva clear, no obvious abnormalities on inspection of external nose and ears  NECK: no obvious masses on inspection  LUNGS: clear to auscultation bilaterally, no wheezes, rales or rhonchi, good air movement  CV: HRRR, no peripheral edema  ABD: soft, BS+, NTTP  Rectal: no lesions seen or palpated, no gross blood on exam or on glove, hemocult +  MS: moves all extremities without noticeable abnormality  PSYCH: pleasant and cooperative, no obvious depression or anxiety  ASSESSMENT AND PLAN:  Discussed the following assessment and plan:  BRBPR (bright red blood per rectum) - Plan: Ambulatory referral to Gastroenterology, POCT hemoglobin  -pt reported BRBPR and blood clots from rectum for 2 days, hemocult +, stat hemoglobin stable and vitals stable -urgent outpt referral to GI -return and ED precautions -Patient advised to return or notify a doctor immediately if symptoms worsen or persist or new concerns arise.  There are no Patient Instructions on file for this visit.   Kriste Basque R.

## 2012-12-17 NOTE — Patient Instructions (Addendum)
Please go to the basement level to have your labs drawn.  We sent a prescription for the Moviprep to [facility] [address]  You have been scheduled for a colonoscopy with propofol. Please follow written instructions given to you at your visit today.  Please pick up your prep kit at the pharmacy within the next 1-3 days. If you use inhalers (even only as needed), please bring them with you on the day of your procedure. Your physician has requested that you go to www.startemmi.com and enter the access code given to you at your visit today. This web site gives a general overview about your procedure. However, you should still follow specific instructions given to you by our office regarding your preparation for the procedure.

## 2012-12-19 ENCOUNTER — Other Ambulatory Visit: Payer: Self-pay | Admitting: *Deleted

## 2012-12-19 DIAGNOSIS — D508 Other iron deficiency anemias: Secondary | ICD-10-CM

## 2012-12-19 MED ORDER — INTEGRA PLUS PO CAPS: ORAL_CAPSULE | ORAL | Status: DC

## 2012-12-30 ENCOUNTER — Ambulatory Visit (AMBULATORY_SURGERY_CENTER): Payer: BC Managed Care – PPO | Admitting: Internal Medicine

## 2012-12-30 ENCOUNTER — Encounter: Payer: Self-pay | Admitting: Internal Medicine

## 2012-12-30 VITALS — BP 149/95 | HR 70 | Temp 97.2°F | Resp 21 | Ht 62.0 in | Wt 269.0 lb

## 2012-12-30 DIAGNOSIS — K625 Hemorrhage of anus and rectum: Secondary | ICD-10-CM

## 2012-12-30 DIAGNOSIS — K648 Other hemorrhoids: Secondary | ICD-10-CM

## 2012-12-30 MED ORDER — SODIUM CHLORIDE 0.9 % IV SOLN: 500.0000 mL | INTRAVENOUS | Status: DC

## 2012-12-30 NOTE — Patient Instructions (Addendum)
I found very small hemorrhoids that must have caused the rectal bleeding. Everything else was ok.  Next routine colonoscopy 2024.  Please take your iron supplements and follow-up with your other physicians.  I appreciate the opportunity to care for you. Iva Boop, MD, [facility]  Discharge instructions given with verbal understanding. Handout on hemorrhoids. Resume previous medications. YOU HAD AN ENDOSCOPIC PROCEDURE TODAY AT THE [facility]: Refer to the procedure report that was given to you for any specific questions about what was found during the examination.  If the procedure report does not answer your questions, please call your gastroenterologist to clarify.  If you requested that your care partner not be given the details of your procedure findings, then the procedure report has been included in a sealed envelope for you to review at your convenience later.  YOU SHOULD EXPECT: Some feelings of bloating in the abdomen. Passage of more gas than usual.  Walking can help get rid of the air that was put into your GI tract during the procedure and reduce the bloating. If you had a lower endoscopy (such as a colonoscopy or flexible sigmoidoscopy) you may notice spotting of blood in your stool or on the toilet paper. If you underwent a bowel prep for your procedure, then you may not have a normal bowel movement for a few days.  DIET: Your first meal following the procedure should be a light meal and then it is ok to progress to your normal diet.  A half-sandwich or bowl of soup is an example of a good first meal.  Heavy or fried foods are harder to digest and may make you feel nauseous or bloated.  Likewise meals heavy in dairy and vegetables can cause extra gas to form and this can also increase the bloating.  Drink plenty of fluids but you should avoid alcoholic beverages for 24 hours.  ACTIVITY: Your care partner should take you home directly after the procedure.  You should  plan to take it easy, moving slowly for the rest of the day.  You can resume normal activity the day after the procedure however you should NOT DRIVE or use heavy machinery for 24 hours (because of the sedation medicines used during the test).    SYMPTOMS TO REPORT IMMEDIATELY: A gastroenterologist can be reached at any hour.  During normal business hours, 8:30 AM to 5:00 PM Monday through Friday, call [phone].  After hours and on weekends, please call the GI answering service at [phone] who will take a message and have the physician on call contact you.   Following lower endoscopy (colonoscopy or flexible sigmoidoscopy):  Excessive amounts of blood in the stool  Significant tenderness or worsening of abdominal pains  Swelling of the abdomen that is new, acute  Fever of 100F or higher FOLLOW UP: If any biopsies were taken you will be contacted by phone or by letter within the next 1-3 weeks.  Call your gastroenterologist if you have not heard about the biopsies in 3 weeks.  Our staff will call the home number listed on your records the next business day following your procedure to check on you and address any questions or concerns that you may have at that time regarding the information given to you following your procedure. This is a courtesy call and so if there is no answer at the home number and we have not heard from you through the emergency physician on call, we will assume that you  have returned to your regular daily activities without incident.  SIGNATURES/CONFIDENTIALITY: You and/or your care partner have signed paperwork which will be entered into your electronic medical record.  These signatures attest to the fact that that the information above on your After Visit Summary has been reviewed and is understood.  Full responsibility of the confidentiality of this discharge information lies with you and/or your care-partner.  

## 2012-12-30 NOTE — Op Note (Signed)
[facility] [address] [place], [address]   COLONOSCOPY PROCEDURE REPORT  PATIENT: Brooke [name], Brooke [name]  MR#: [id number] BIRTHDATE: 01-Sep-1970 , 42  yrs. old GENDER: Female ENDOSCOPIST: Iva Boop, MD, [facility] PROCEDURE DATE:  12/30/2012 PROCEDURE:   Colonoscopy, diagnostic ASA CLASS:   Class III INDICATIONS:Rectal Bleeding and Iron Deficiency Anemia. MEDICATIONS: Propofol (Diprivan) 320 mg IV, MAC sedation, administered by CRNA, and These medications were titrated to patient response per physician's verbal order  DESCRIPTION OF PROCEDURE:   After the risks benefits and alternatives of the procedure were thoroughly explained, informed consent was obtained.  A digital rectal exam revealed no abnormalities of the rectum.   The LB [id number]  endoscope was introduced through the anus and advanced to the cecum, which was identified by both the appendix and ileocecal valve. No adverse events experienced.   The quality of the prep was excellent, using MoviPrep  The instrument was then slowly withdrawn as the colon was fully examined.     COLON FINDINGS: A normal appearing cecum, ileocecal valve, and appendiceal orifice were identified.  The ascending, hepatic flexure, transverse, splenic flexure, descending, sigmoid colon and rectum appeared unremarkable.  No polyps or cancers were seen.   A right colon retroflexion was performed.  Retroflexed rectal views revealed internal hemorrhoids. The time to cecum=1 minutes 42 seconds.  Withdrawal time=10 minutes 25 seconds.  The scope was withdrawn and the procedure completed. COMPLICATIONS: There were no complications.  ENDOSCOPIC IMPRESSION: 1.   Normal colon 2.   Internal hemorrhoids  RECOMMENDATIONS: 1.  Repeat Colonscopy in 10 years. 2.   Continue iron supplements see GI as needed   eSigned:  Iva Boop, MD, [facility] 12/30/2012 9:42 AM   cc: Thomos Lemons, DO and The Patient

## 2012-12-31 ENCOUNTER — Telehealth: Payer: Self-pay | Admitting: *Deleted

## 2012-12-31 NOTE — Telephone Encounter (Signed)
  Follow up Call-  Call back number 12/30/2012  Post procedure Call Back phone  # [phone] hm  Permission to leave phone message Yes     Patient questions:  Do you have a fever, pain , or abdominal swelling? no Pain Score  0 *  Have you tolerated food without any problems? yes  Have you been able to return to your normal activities? yes  Do you have any questions about your discharge instructions: Diet   no Medications  no Follow up visit  no  Do you have questions or concerns about your Care? no  Actions: * If pain score is 4 or above: No action needed, pain <4.

## 2013-04-02 ENCOUNTER — Telehealth: Payer: Self-pay | Admitting: Internal Medicine

## 2013-04-02 NOTE — Telephone Encounter (Signed)
Please call pt to find out her concerns about dietary supplement.

## 2013-04-02 NOTE — Telephone Encounter (Signed)
Pt would like nurse to return her call concerning start dietary supplement. Pt is out of work for 4 wks due to feet issues.

## 2013-04-09 NOTE — Telephone Encounter (Signed)
Pt is managing her diet ok.  She has lost 4 lbs

## 2014-04-01 ENCOUNTER — Other Ambulatory Visit: Payer: Self-pay

## 2014-04-01 DIAGNOSIS — Z1231 Encounter for screening mammogram for malignant neoplasm of breast: Secondary | ICD-10-CM

## 2014-04-06 ENCOUNTER — Ambulatory Visit
Admission: RE | Admit: 2014-04-06 | Discharge: 2014-04-06 | Disposition: A | Discharge status: Home / Self Care | Payer: BC Managed Care – PPO | Source: Ambulatory Visit

## 2014-04-06 ENCOUNTER — Encounter (INDEPENDENT_AMBULATORY_CARE_PROVIDER_SITE_OTHER): Payer: Self-pay

## 2014-04-06 DIAGNOSIS — Z1231 Encounter for screening mammogram for malignant neoplasm of breast: Secondary | ICD-10-CM

## 2015-02-15 ENCOUNTER — Other Ambulatory Visit: Payer: Self-pay | Admitting: Obstetrics and Gynecology

## 2015-02-15 ENCOUNTER — Other Ambulatory Visit (HOSPITAL_COMMUNITY)
Admission: RE | Admit: 2015-02-15 | Discharge: 2015-02-15 | Disposition: A | Discharge status: Home / Self Care | Payer: BC Managed Care – PPO | Source: Ambulatory Visit | Attending: Obstetrics and Gynecology | Admitting: Obstetrics and Gynecology

## 2015-02-15 DIAGNOSIS — Z01411 Encounter for gynecological examination (general) (routine) with abnormal findings: Secondary | ICD-10-CM | POA: Insufficient documentation

## 2015-02-15 DIAGNOSIS — Z1151 Encounter for screening for human papillomavirus (HPV): Secondary | ICD-10-CM | POA: Diagnosis present

## 2015-02-17 LAB — CYTOLOGY - PAP

## 2015-07-05 ENCOUNTER — Other Ambulatory Visit: Payer: Self-pay

## 2015-07-05 DIAGNOSIS — Z1231 Encounter for screening mammogram for malignant neoplasm of breast: Secondary | ICD-10-CM

## 2015-07-23 ENCOUNTER — Ambulatory Visit
Admission: RE | Admit: 2015-07-23 | Discharge: 2015-07-23 | Disposition: A | Discharge status: Home / Self Care | Payer: BC Managed Care – PPO | Source: Ambulatory Visit

## 2015-07-23 DIAGNOSIS — Z1231 Encounter for screening mammogram for malignant neoplasm of breast: Secondary | ICD-10-CM

## 2016-03-16 ENCOUNTER — Ambulatory Visit
Admission: RE | Admit: 2016-03-16 | Discharge: 2016-03-16 | Disposition: A | Discharge status: Home / Self Care | Payer: BC Managed Care – PPO | Source: Ambulatory Visit | Attending: Internal Medicine | Admitting: Internal Medicine

## 2016-03-16 ENCOUNTER — Other Ambulatory Visit: Payer: Self-pay | Admitting: Internal Medicine

## 2016-03-16 DIAGNOSIS — R002 Palpitations: Secondary | ICD-10-CM

## 2016-03-31 ENCOUNTER — Encounter: Payer: Self-pay | Admitting: Cardiovascular Disease

## 2016-03-31 ENCOUNTER — Ambulatory Visit (INDEPENDENT_AMBULATORY_CARE_PROVIDER_SITE_OTHER): Payer: BC Managed Care – PPO | Admitting: Cardiovascular Disease

## 2016-03-31 VITALS — BP 122/87 | HR 84 | Ht 62.0 in | Wt 248.2 lb

## 2016-03-31 DIAGNOSIS — I10 Essential (primary) hypertension: Secondary | ICD-10-CM | POA: Diagnosis not present

## 2016-03-31 DIAGNOSIS — Z5181 Encounter for therapeutic drug level monitoring: Secondary | ICD-10-CM | POA: Diagnosis not present

## 2016-03-31 DIAGNOSIS — R002 Palpitations: Secondary | ICD-10-CM

## 2016-03-31 NOTE — Progress Notes (Signed)
Cardiology Office Note   Date:  03/31/2016   ID:  Brooke [name], DOB [birth year], MRN [id number]  PCP:  Brooke Greenland, MD  Cardiologist:   Brooke Latch, MD   Chief Complaint  Patient presents with  . New Patient (Initial Visit)    essential hypertension      History of Present Illness: Brooke [name] is a 45 y.o. female with hypertension and morbid obesity who presents for an evaluation of palpitations.  Since the beginning of 2017 she has noted intermittent palpitations that occur somewhere between once every few days and once a month. It typically occurs when she is at rest. It lasts for seconds at a time and there Is no associated shortness of breath, chest pain, or lightheadedness. She does sometimes have lightheadedness upon standing, though this is better now that her blood pressure is well-controlled.  Brooke [name] saw Dr. Glendale [name] on 02/2016 and reported palpitations.  At that appointment she was noted to have poorly controlled blood pressure and was started on hydrochlorothiazide. For the first week after starting this medicine she felt lightheaded but this has since subsided. She notes some occasional lower extremity edema that is worse after she has been standing for prolonged periods of time. It improves with elevation of her legs. She also notes chronic left knee pain that is attributable to arthritis and bone spurs. She recently had a cortisone shot. Because of the knee she has not been exercising regularly. Several months ago she was going to the gym multiple times per week. She did not have any chest pain or shortness of breath with this activity.  She does not drink much caffeine or use over the counter cold or cough medication.   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    Past Medical History:  Diagnosis Date  . Allergy    allergic rhinitis  . Anemia    nos  . Anxiety   . Arthritis    right ankle  . Asthma    as a child  .  Elevated blood pressure reading without diagnosis of hypertension   . Genital herpes   . Migraine     Past Surgical History:  Procedure Laterality Date  . ANKLE SURGERY       Current Outpatient Prescriptions  Medication Sig Dispense Refill  . calcium carbonate (OSCAL) 1500 (600 Ca) MG TABS tablet Take 1 tablet by mouth daily.    Marland Kitchen docusate sodium (COLACE) 100 MG capsule Take 100 mg by mouth 3 (three) times a week.    . hydrochlorothiazide (MICROZIDE) 12.5 MG capsule Take 1 capsule by mouth daily.    Marland Kitchen MAGNESIUM CITRATE PO Take 1 Dose by mouth once a week.    . Melatonin 2.5 MG CAPS Take 2.5 mg by mouth at bedtime as needed (sleep).    . Multiple Vitamin (MULTIVITAMIN) tablet Take 1 tablet by mouth daily.    Brooke [name] Sodium (SENNA S PO) Take 50 mg by mouth once a week.    . valACYclovir (VALTREX) 500 MG tablet Take 1 tablet by mouth every 12 hours x 3 days 6 tablet 2  . VITAMIN D, CHOLECALCIFEROL, PO Take 5,000 Units by mouth daily.    Marland Kitchen VYVANSE 50 MG capsule Take 1 capsule by mouth daily.     No current facility-administered medications for this visit.     Allergies:   Review of patient's allergies indicates no known allergies.    Social History:  The patient  reports that she has never smoked. She has never used smokeless tobacco. She reports that she does not drink alcohol or use drugs.   Family History:  The patient's family history includes Diabetes in her maternal aunt, maternal uncle, and paternal aunt; Heart failure in her maternal grandfather, maternal grandmother, and paternal aunt; Hypertension in her father and mother.    ROS:  Please see the history of present illness.   Otherwise, review of systems are positive for left arm and leg tingling.  L knee catches.   All other systems are reviewed and negative.    PHYSICAL EXAM: VS:  BP 122/87   Pulse 84   Ht 5\' 2"  (1.575 m)   Wt 248 lb 3.2 oz (112.6 kg)   BMI 45.40 kg/m  , BMI Body mass index is 45.4  kg/m. GENERAL:  Well appearing HEENT:  Pupils equal round and reactive, fundi not visualized, oral mucosa unremarkable NECK:  No jugular venous distention, waveform within normal limits, carotid upstroke brisk and symmetric, no bruits, no thyromegaly LYMPHATICS:  No cervical adenopathy LUNGS:  Clear to auscultation bilaterally HEART:  RRR.  PMI not displaced or sustained,S1 and S2 within normal limits, no S3, no S4, no clicks, no rubs, no murmurs ABD:  Flat, positive bowel sounds normal in frequency in pitch, no bruits, no rebound, no guarding, no midline pulsatile mass, no hepatomegaly, no splenomegaly EXT:  2 plus pulses throughout, no edema, no cyanosis no clubbing SKIN:  No rashes no nodules NEURO:  Cranial nerves II through XII grossly intact, motor grossly intact throughout PSYCH:  Cognitively intact, oriented to person place and time    EKG:  EKG is not ordered today. The ekg ordered 12/09/15  demonsinus rhythm. Rate 76 bpm.  Recent Labs: No results found for requested labs within last 8760 hours.    Lipid Panel    Component Value Date/Time   CHOL 187 02/08/2011 1035   TRIG 41 02/08/2011 1035   HDL 57 02/08/2011 1035   CHOLHDL 3.3 02/08/2011 1035   VLDL 8 02/08/2011 1035   LDLCALC 122 (H) 02/08/2011 1035      Wt Readings from Last 3 Encounters:  03/31/16 248 lb 3.2 oz (112.6 kg)  12/30/12 269 lb (122 kg)  12/17/12 269 lb (122 kg)      ASSESSMENT AND PLAN:  # Palpitations: Brooke [name] reports intermittent episodes of palpitations. As her description it sounds like PACs or PVCs. We discussed the fact that this is not dangerous. Given that it occurs sporadically it does not seem that wearing a heart monitor would be helpful. She artery had the appropriate laboratory testing with Dr. Baird Cancer and it was unremarkable. She did not have a TSH and free T4 checked, so we will do that at this time.  # Hypertension:  Blood pressure is well-controlled.  Continue HCTZ.  We will  check a BMP given that HCTZ was recently started.  # CV Disease Prevention: # Morbid Obesity: We discussed the importance of regular exercise and weight loss.    Current medicines are reviewed at length with the patient today.  The patient does not have concerns regarding medicines.  The following changes have been made:  no change  Labs/ tests ordered today include:   Orders Placed This Encounter  Procedures  . Basic metabolic panel  . TSH  . T4, free   Time spent: 60 minutes-Greater than 50% of this time was spent in counseling, explanation of diagnosis, planning of  further management, and coordination of care.   Disposition:   FU with Tiffany C. Oval Linsey, MD, [facility] in 1 year.     This note was written with the assistance of speech recognition software.  Please excuse any transcriptional errors.  Signed, Tiffany C. Oval Linsey, MD, [facility]  03/31/2016 4:47 PM    [place]

## 2016-03-31 NOTE — Patient Instructions (Addendum)
Medication Instructions:  Your physician recommends that you continue on your current medications as directed. Please refer to the Current Medication list given to you today.  Labwork: Your physician recommends that you have lab work today: TSH, Free T4 and BMP  Testing/Procedures: No new orders.   Follow-Up: Your physician wants you to follow-up in: 93 YEAR With Dr Oval Linsey.  You will receive a reminder letter in the mail two months in advance. If you don't receive a letter, please call our office to schedule the follow-up appointment.   Any Other Special Instructions Will Be Listed Below (If Applicable).     If you need a refill on your cardiac medications before your next appointment, please call your pharmacy.

## 2016-04-03 LAB — BASIC METABOLIC PANEL
BUN: 14 mg/dL (ref 7–25)
CO2: 29 mmol/L (ref 20–31)
Calcium: 9.2 mg/dL (ref 8.6–10.2)
Chloride: 102 mmol/L (ref 98–110)
Creat: 0.74 mg/dL (ref 0.50–1.10)
Glucose, Bld: 74 mg/dL (ref 65–99)
Potassium: 4.1 mmol/L (ref 3.5–5.3)
Sodium: 140 mmol/L (ref 135–146)

## 2016-04-03 LAB — TSH: TSH: 1.3 mIU/L

## 2016-04-03 LAB — T4, FREE: Free T4: 0.9 ng/dL (ref 0.8–1.8)

## 2016-06-23 ENCOUNTER — Other Ambulatory Visit: Payer: Self-pay | Admitting: Internal Medicine

## 2016-06-23 DIAGNOSIS — Z1231 Encounter for screening mammogram for malignant neoplasm of breast: Secondary | ICD-10-CM

## 2016-07-26 ENCOUNTER — Ambulatory Visit
Admission: RE | Admit: 2016-07-26 | Discharge: 2016-07-26 | Disposition: A | Discharge status: Home / Self Care | Payer: BC Managed Care – PPO | Source: Ambulatory Visit | Attending: Internal Medicine | Admitting: Internal Medicine

## 2016-07-26 DIAGNOSIS — Z1231 Encounter for screening mammogram for malignant neoplasm of breast: Secondary | ICD-10-CM

## 2017-07-13 ENCOUNTER — Other Ambulatory Visit: Payer: Self-pay | Admitting: Nurse Practitioner

## 2017-07-13 DIAGNOSIS — R590 Localized enlarged lymph nodes: Secondary | ICD-10-CM

## 2017-07-17 ENCOUNTER — Ambulatory Visit
Admission: RE | Admit: 2017-07-17 | Discharge: 2017-07-17 | Disposition: A | Discharge status: Home / Self Care | Payer: BC Managed Care – PPO | Source: Ambulatory Visit | Attending: Nurse Practitioner | Admitting: Nurse Practitioner

## 2017-07-17 DIAGNOSIS — R590 Localized enlarged lymph nodes: Secondary | ICD-10-CM

## 2017-08-14 ENCOUNTER — Encounter: Payer: Self-pay | Admitting: Neurology

## 2017-08-14 ENCOUNTER — Ambulatory Visit: Payer: BC Managed Care – PPO | Admitting: Neurology

## 2017-08-14 VITALS — BP 153/101 | HR 85 | Ht 62.0 in | Wt 265.0 lb

## 2017-08-14 DIAGNOSIS — Z6841 Body Mass Index (BMI) 40.0 and over, adult: Secondary | ICD-10-CM

## 2017-08-14 DIAGNOSIS — R51 Headache: Secondary | ICD-10-CM | POA: Diagnosis not present

## 2017-08-14 DIAGNOSIS — R0681 Apnea, not elsewhere classified: Secondary | ICD-10-CM

## 2017-08-14 DIAGNOSIS — G4719 Other hypersomnia: Secondary | ICD-10-CM

## 2017-08-14 DIAGNOSIS — R0683 Snoring: Secondary | ICD-10-CM

## 2017-08-14 DIAGNOSIS — R002 Palpitations: Secondary | ICD-10-CM

## 2017-08-14 DIAGNOSIS — R351 Nocturia: Secondary | ICD-10-CM | POA: Diagnosis not present

## 2017-08-14 DIAGNOSIS — R519 Headache, unspecified: Secondary | ICD-10-CM

## 2017-08-14 NOTE — Progress Notes (Signed)
Subjective:    [name] ID: Brooke [name] is a 47 y.o. female.  HPI     [name], MD, [name] [address].O. [place], [place] [address]  Dear Doreene Burke,  I saw your [name], Brooke [name], upon your kind request in my neurologic clinic today for initial consultation of Brooke sleep disorder, in particular, concern for underlying obstructive sleep apnea. Brooke [name] is unaccompanied today. As you know, Brooke [name] is a 47 year old right-handed woman with an underlying medical history of migraine headaches, arthritis, anxiety, anemia, allergies, history of asthma, and morbid obesity with a BMI of over 45, who reports snoring and excessive daytime somnolence. I reviewed your office note from 07/02/2017, which you kindly included. Brooke Epworth sleepiness score is 15 out of 24, fatigue score is 34 out of 63. She reports not waking up rested, sometimes she feels slowing cognition. She wakes up with muscle stiffness. She is single and lives alone, she works for Brooke [employer] system. She is a nonsmoker and drinks alcohol rarely, caffeine in Brooke form of coffee 1 large cup per day on average.  She has a stressful drop. 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 She used to be principal for 5 years but it caused Brooke too much stress, at which point she had to take a step back. She was able to lose weight a few years ago in Brooke realm of 50 pounds but slowly gained it back. She has nocturia about once or twice per average night, she has woken up with a headache. Years ago, when she was still principal at Brooke school she tried something for sleep, possibly trazodone. She currently takes melatonin every night. Bedtime is around 11 or 12, rice time around 5:30. She has no pets. She typically sleeps on Brooke sides. She does not have an official family history of sleep apnea but suspects that Brooke father may have sleep apnea. She has had sleep paralysis  before, less now than some years ago. She has had rare enuresis.  Brooke Past Medical History Is Significant For: Past Medical History:  Diagnosis Date  . Allergy    allergic rhinitis  . Anemia    nos  . Anxiety   . Arthritis    right ankle  . Asthma    as a child  . Elevated blood pressure reading without diagnosis of hypertension   . Genital herpes   . Migraine     Brooke Past Surgical History Is Significant For: Past Surgical History:  Procedure Laterality Date  . ANKLE SURGERY      Brooke Family History Is Significant For: Family History  Problem Relation Age of Onset  . Hypertension Mother   . Hypertension Father   . Hypertension Unknown   . Diabetes Maternal Aunt   . Diabetes Maternal Uncle   . Heart failure Paternal Aunt   . Diabetes Paternal Aunt   . Heart failure Maternal Grandmother   . Heart failure Maternal Grandfather     Brooke Social History Is Significant For: Social History   Socioeconomic History  . Marital status: Single    Spouse name: None  . Number of children: None  . Years of education: None  . Highest education level: None  Social Needs  . Financial resource strain: None  . Food insecurity - worry: None  . Food insecurity - inability: None  . Transportation needs - medical: None  . Transportation needs - non-medical: None  Occupational History  . None  Tobacco Use  . Smoking status: Never Smoker  . Smokeless tobacco: Never Used  Substance and Sexual Activity  . Alcohol use: No  . Drug use: No  . Sexual activity: None  Other Topics Concern  . None  Social History Narrative  . None    Brooke Allergies Are:  No Known Allergies:   Brooke Current Medications Are:  Outpatient Encounter Medications as of 08/14/2017  Medication Sig  . b complex vitamins tablet Take 1 tablet by mouth daily.  . calcium carbonate (OSCAL) 1500 (600 Ca) MG TABS tablet Take 1 tablet by mouth daily.  . cycloSPORINE (RESTASIS) 0.05 % ophthalmic emulsion 1 drop 2  (two) times daily.  . hydrochlorothiazide (MICROZIDE) 12.5 MG capsule Take 1 capsule by mouth daily.  Marland Kitchen lisdexamfetamine (VYVANSE) 40 MG capsule Take 40 mg by mouth every morning.  Marland Kitchen MAGNESIUM GLUCONATE PO Take by mouth.  . Melatonin 5 MG TABS Take 5 mg by mouth at bedtime.  . Multiple Vitamin (MULTIVITAMIN) tablet Take 1 tablet by mouth daily.  . sertraline (ZOLOFT) 100 MG tablet Take 100 mg by mouth daily.  . valACYclovir (VALTREX) 500 MG tablet Take 1 tablet by mouth every 12 hours x 3 days  . VITAMIN D, CHOLECALCIFEROL, PO Take 5,000 Units by mouth daily.  . [DISCONTINUED] docusate sodium (COLACE) 100 MG capsule Take 100 mg by mouth 3 (three) times a week.  . [DISCONTINUED] MAGNESIUM CITRATE PO Take 1 Dose by mouth once a week.  . [DISCONTINUED] Melatonin 2.5 MG CAPS Take 2.5 mg by mouth at bedtime as needed (sleep).  . [DISCONTINUED] Sennosides-Docusate Sodium (SENNA S PO) Take 50 mg by mouth once a week.  . [DISCONTINUED] sertraline (ZOLOFT) 100 MG tablet Take 100 mg by mouth daily.  . [DISCONTINUED] sertraline (ZOLOFT) 50 MG tablet Take 50 mg by mouth daily.   No facility-administered encounter medications on file as of 08/14/2017.   :  Review of Systems:  Out of a complete 14 point review of systems, all are reviewed and negative with Brooke exception of these symptoms as listed below:  Review of Systems  Neurological:       Pt presents today to discuss Brooke sleep. Pt has never had a sleep study but does endorse snoring.  Epworth Sleepiness Scale 0= would never doze 1= slight chance of dozing 2= moderate chance of dozing 3= high chance of dozing  Sitting and reading: 3 Watching TV: 3 Sitting inactive in a public place (ex. Theater or meeting): 2 As a passenger in a car for an hour without a break: 3 Lying down to rest in Brooke afternoon: 2 Sitting and talking to someone: 0 Sitting quietly after lunch (no alcohol): 0 In a car, while stopped in traffic: 2 Total: 15      Objective:  Neurological Exam  Physical Exam Physical Examination:   Vitals:   08/14/17 1543  BP: (!) 153/101  Pulse: 85    General Examination: Brooke [name] is a very pleasant 47 y.o. female in no acute distress. She appears well-developed and well-nourished and very well groomed.   HEENT: Normocephalic, atraumatic, pupils are equal, round and reactive to light and accommodation. She wears corrective eyeglasses. Extraocular tracking is good without limitation to gaze excursion or nystagmus noted. Normal smooth pursuit is noted. Hearing is grossly intact. Tympanic membranes are clear bilaterally. Face is symmetric with normal facial animation and normal facial sensation. Speech is clear with no dysarthria noted. There is no  hypophonia. There is no lip, neck/head, jaw or voice tremor. Neck is supple with full range of passive and active motion. There are no carotid bruits on auscultation. Oropharynx exam reveals: mild mouth dryness, good dental hygiene and moderate airway crowding, due to wider tongue, longer uvula, tonsils in place which are about 1-2+ bilaterally. Mallampati is class II. Tongue protrudes centrally and palate elevates symmetrically. Neck circumference is 17 1/8 inches. She has a mild overbite. Nasal inspection reveals no significant nasal mucosal bogginess or redness and no septal deviation.   Chest: Clear to auscultation without wheezing, rhonchi or crackles noted.  Heart: S1+S2+0, regular and normal without murmurs, rubs or gallops noted.   Abdomen: Soft, non-tender and non-distended with normal bowel sounds appreciated on auscultation.  Extremities: There is no pitting edema in Brooke distal lower extremities bilaterally. Pedal pulses are intact.  Skin: Warm and dry without trophic changes noted.  Musculoskeletal: exam reveals no obvious joint deformities, tenderness or joint swelling or erythema.   Neurologically:  Mental status: Brooke [name] is awake, alert and  oriented in all 4 spheres. Brooke immediate and remote memory, attention, language skills and fund of knowledge are appropriate. There is no evidence of aphasia, agnosia, apraxia or anomia. Speech is clear with normal prosody and enunciation. Thought process is linear. Mood is normal and affect is normal.  Cranial nerves II - XII are as described above under HEENT exam. In addition: shoulder shrug is normal with equal shoulder height noted. Motor exam: Normal bulk, strength and tone is noted. There is no drift, tremor or rebound. Romberg is negative. Reflexes are 2+ throughout. Fine motor skills and coordination: intact with normal finger taps, normal hand movements, normal rapid alternating patting, normal foot taps and normal foot agility.  Cerebellar testing: No dysmetria or intention tremor on finger to nose testing. Heel to shin is unremarkable bilaterally. There is no truncal or gait ataxia.  Sensory exam: intact to light touch in Brooke upper and lower extremities.  Gait, station and balance: She stands easily. No veering to one side is noted. No leaning to one side is noted. Posture is age-appropriate and stance is narrow based. Gait shows normal stride length and normal pace. No problems turning are noted. Tandem walk is unremarkable.  Assessment and plan:  In summary, Brooke [name] is a very pleasant 47 y.o.-year old female with an underlying medical history of migraine headaches, arthritis, anxiety, anemia, allergies, history of asthma, and morbid obesity with a BMI of over 45, whose history and physical exam are concerning for obstructive sleep apnea (OSA). I had a long chat with Brooke [name] about my findings and Brooke diagnosis of OSA, its prognosis and treatment options. We talked about medical treatments, surgical interventions and non-pharmacological approaches. I explained in particular Brooke risks and ramifications of untreated moderate to severe OSA, especially with respect to developing  cardiovascular disease down Brooke [address], including congestive heart failure, difficult to treat hypertension, cardiac arrhythmias, or stroke. Even type 2 diabetes has, in part, been linked to untreated OSA. Symptoms of untreated OSA include daytime sleepiness, memory problems, mood irritability and mood disorder such as depression and anxiety, lack of energy, as well as recurrent headaches, especially morning headaches. We talked about trying to maintain a healthy lifestyle in general, as well as Brooke importance of weight control. I encouraged Brooke [name] to eat healthy, exercise daily and keep well hydrated, to keep a scheduled bedtime and wake time routine, to not skip any meals and eat healthy  snacks in between meals. I advised Brooke [name] not to drive when feeling sleepy. I recommended Brooke following at this time: sleep study with potential positive airway pressure titration. (We will score hypopneas at 3%).   I explained Brooke sleep test procedure to Brooke [name] and also outlined possible surgical and non-surgical treatment options of OSA, including Brooke use of a custom-made dental device (which would require a referral to a specialist dentist or oral surgeon), upper airway surgical options, such as pillar implants, radiofrequency surgery, tongue base surgery, and UPPP (which would involve a referral to an ENT surgeon). Rarely, jaw surgery such as mandibular advancement may be considered.  I also explained Brooke CPAP treatment option to Brooke [name], who indicated that she would be willing to try CPAP if Brooke need arises. I explained Brooke importance of being compliant with PAP treatment, not only for insurance purposes but primarily to improve Brooke symptoms, and for Brooke [name]'s long term health benefit, including to reduce Brooke cardiovascular risks. I answered all Brooke questions today and Brooke [name] was in agreement. I would like to see Brooke back after Brooke sleep study is completed and encouraged Brooke to call with any  interim questions, concerns, problems or updates.   Thank you very much for allowing me to participate in Brooke care of this nice [name]. If I can be of any further assistance to you please do not hesitate to call me at [phone].  Sincerely,   [name], MD, PhD

## 2017-08-14 NOTE — Patient Instructions (Signed)

## 2017-09-06 ENCOUNTER — Other Ambulatory Visit: Payer: Self-pay | Admitting: Internal Medicine

## 2017-09-06 DIAGNOSIS — Z1231 Encounter for screening mammogram for malignant neoplasm of breast: Secondary | ICD-10-CM

## 2017-09-12 ENCOUNTER — Ambulatory Visit (INDEPENDENT_AMBULATORY_CARE_PROVIDER_SITE_OTHER): Payer: BC Managed Care – PPO | Admitting: Neurology

## 2017-09-12 DIAGNOSIS — G4720 Circadian rhythm sleep disorder, unspecified type: Secondary | ICD-10-CM

## 2017-09-12 DIAGNOSIS — R0683 Snoring: Secondary | ICD-10-CM

## 2017-09-12 DIAGNOSIS — R0681 Apnea, not elsewhere classified: Secondary | ICD-10-CM

## 2017-09-12 DIAGNOSIS — G4719 Other hypersomnia: Secondary | ICD-10-CM

## 2017-09-12 DIAGNOSIS — G4733 Obstructive sleep apnea (adult) (pediatric): Secondary | ICD-10-CM | POA: Diagnosis not present

## 2017-09-12 DIAGNOSIS — R519 Headache, unspecified: Secondary | ICD-10-CM

## 2017-09-12 DIAGNOSIS — R51 Headache: Secondary | ICD-10-CM

## 2017-09-12 DIAGNOSIS — Z6841 Body Mass Index (BMI) 40.0 and over, adult: Secondary | ICD-10-CM

## 2017-09-12 DIAGNOSIS — R002 Palpitations: Secondary | ICD-10-CM

## 2017-09-12 DIAGNOSIS — R351 Nocturia: Secondary | ICD-10-CM

## 2017-09-14 NOTE — Progress Notes (Signed)
Patient referred by Minette Brine, seen by me on 08/14/17, diagnostic PSG on 09/12/17.   Please call and notify the patient that the recent sleep study showed moderate to severe obstructive sleep apnea. I recommend treatment for this in the form of CPAP. This will require a repeat sleep study for proper titration and mask fitting and correct monitoring of the oxygen saturations. Please explain to patient. I have placed an order in the chart. Thanks.  [name], MD, PhD [facility])

## 2017-09-14 NOTE — Procedures (Signed)
PATIENT'S NAME:  Brooke [name], Brooke [name] DOB:      January 07, 1971      MR#:    [id number]     DATE OF RECORDING: 09/12/2017 REFERRING M.D.:  Minette Brine, FNP Study Performed:   Baseline Polysomnogram HISTORY: 47 year old woman with a history of migraine headaches, arthritis, anxiety, anemia, allergies, history of asthma, and morbid obesity with a BMI of over 45, who reports snoring and excessive daytime somnolence. The patient endorsed the Epworth Sleepiness Scale at 15/24 points. The patient's weight 265 pounds with a height of 62 (inches), resulting in a BMI of 48.7 kg/m2. The patient's neck circumference measured 17.5 inches.  CURRENT MEDICATIONS: B Complex, Oscal, Restasis, Microzide, Vyvanse, Magnesium Gluconate, Melatonin, Multivitamin, Zoloft, Valtrex, Vitamin D.   PROCEDURE:  This is a multichannel digital polysomnogram utilizing the Somnostar 11.2 system.  Electrodes and sensors were applied and monitored per AASM Specifications.   EEG, EOG, Chin and Limb EMG, were sampled at 200 Hz.  ECG, Snore and Nasal Pressure, Thermal Airflow, Respiratory Effort, CPAP Flow and Pressure, Oximetry was sampled at 50 Hz. Digital video and audio were recorded.      BASELINE STUDY  Lights Out was at 22:47 and Lights On at 04:54.  Total recording time (TRT) was 367.5 minutes, with a total sleep time (TST) of  314 minutes.  The patient's sleep latency was 23.5 minutes. REM latency was 266 minutes. The sleep efficiency was 85.4%.     SLEEP ARCHITECTURE: WASO (Wake after sleep onset) was 34.5 minutes with mild to moderate sleep fragmentation noted. There were 24 minutes in Stage N1, 156 minutes Stage N2, 74 minutes Stage N3 and 60 minutes in Stage REM.  The percentage of Stage N1 was 7.6%, Stage N2 was 49.7%, Stage N3 was 23.6% and Stage R (REM sleep) was 19.1%. The arousals were noted as: 45 were spontaneous, 3 were associated with PLMs, 14 were associated with respiratory events.  Audio and video analysis did not  show any abnormal or unusual movements, behaviors, phonations or vocalizations. The patient took 1 bathroom break. Mild to moderate snoring was noted. The EKG was in keeping with normal sinus rhythm (NSR).  RESPIRATORY ANALYSIS:  There were a total of 81 respiratory events:  9 obstructive apneas, 4 central apneas and 1 mixed apneas with a total of 14 apneas and an apnea index (AI) of 2.7 /hour. There were 67 hypopneas with a hypopnea index of 12.8 /hour. The patient also had 0 respiratory event related arousals (RERAs).      The total APNEA/HYPOPNEA INDEX (AHI) was 15.5/hour and the total RESPIRATORY DISTURBANCE INDEX was 15.5 /hour.  38 events occurred in REM sleep and 82 events in NREM. The REM AHI was 38/hour, versus a non-REM AHI of 10.2. The patient spent 78.5 minutes of total sleep time in the supine position and 236 minutes in non-supine.. The supine AHI was 13.8 versus a non-supine AHI of 16.0.  OXYGEN SATURATION & C02:  The Wake baseline 02 saturation was 96%, with the lowest being 71%. Time spent below 89% saturation equaled 8 minutes.  PERIODIC LIMB MOVEMENTS: The patient had a total of 5 Periodic Limb Movements.  The Periodic Limb Movement (PLM) index was 1. and the PLM Arousal index was .6/hour.  Post-study, the patient indicated that sleep was the same as usual.   IMPRESSION:  1. Obstructive Sleep Apnea (OSA) 2. Dysfunctions associated with sleep stages or arousal from sleep  RECOMMENDATIONS:  1. This study demonstrates moderate to severe obstructive sleep  apnea, with a total AHI of 15.5/hour, REM AHI of 38/hour, supine AHI of 13.8/hour and O2 nadir of 71%. Treatment with positive airway pressure in the form of CPAP is recommended. This will require a full night titration study to optimize therapy. Other treatment options may include avoidance of supine sleep position along with weight loss, upper airway or jaw surgery in selected patients or the use of an oral appliance in certain  patients. ENT evaluation and/or consultation with a maxillofacial surgeon or dentist may be feasible in some instances.    2. Please note that untreated obstructive sleep apnea carries additional perioperative morbidity. Patients with significant obstructive sleep apnea should receive perioperative PAP therapy and the surgeons and particularly the anesthesiologist should be informed of the diagnosis and the severity of the sleep disordered breathing. 3. This study shows sleep fragmentation and abnormal sleep stage percentages; these are nonspecific findings and per se do not signify an intrinsic sleep disorder or a cause for the patient's sleep-related symptoms. Causes include (but are not limited to) the first night effect of the sleep study, circadian rhythm disturbances, medication effect or an underlying mood disorder or medical problem.  4. The patient should be cautioned not to drive, work at heights, or operate dangerous or heavy equipment when tired or sleepy. Review and reiteration of good sleep hygiene measures should be pursued with any patient. 5. The patient will be seen in follow-up by Dr. Rexene Alberts at [facility] for discussion of the test results and further management strategies. The referring provider will be notified of the test results.  I certify that I have reviewed the entire raw data recording prior to the issuance of this report in accordance with the Standards of Accreditation of the American Academy of Sleep Medicine (AASM)   [name], MD, PhD Diplomat, American Board of Psychiatry and Neurology (Neurology and Sleep Medicine)

## 2017-09-14 NOTE — Addendum Note (Signed)
Addended by: [name] on: 09/14/2017 01:16 PM   Modules accepted: Orders

## 2017-09-17 ENCOUNTER — Telehealth: Payer: Self-pay

## 2017-09-17 NOTE — Telephone Encounter (Signed)
I called pt to discuss her sleep study results. No answer, left a message asking her to call me back. 

## 2017-09-17 NOTE — Telephone Encounter (Signed)
-----   Message from [name], MD sent at 09/14/2017  1:16 PM EDT ----- Patient referred by Minette Brine, seen by me on 08/14/17, diagnostic PSG on 09/12/17.   Please call and notify the patient that the recent sleep study showed moderate to severe obstructive sleep apnea. I recommend treatment for this in the form of CPAP. This will require a repeat sleep study for proper titration and mask fitting and correct monitoring of the oxygen saturations. Please explain to patient. I have placed an order in the chart. Thanks.  [name], MD, PhD [facility])

## 2017-09-18 NOTE — Telephone Encounter (Signed)

## 2017-09-27 ENCOUNTER — Ambulatory Visit
Admission: RE | Admit: 2017-09-27 | Discharge: 2017-09-27 | Disposition: A | Discharge status: Home / Self Care | Payer: BC Managed Care – PPO | Source: Ambulatory Visit | Attending: Internal Medicine | Admitting: Internal Medicine

## 2017-09-27 DIAGNOSIS — Z1231 Encounter for screening mammogram for malignant neoplasm of breast: Secondary | ICD-10-CM

## 2017-10-28 ENCOUNTER — Ambulatory Visit (INDEPENDENT_AMBULATORY_CARE_PROVIDER_SITE_OTHER): Payer: BC Managed Care – PPO | Admitting: Neurology

## 2017-10-28 DIAGNOSIS — R0681 Apnea, not elsewhere classified: Secondary | ICD-10-CM

## 2017-10-28 DIAGNOSIS — R519 Headache, unspecified: Secondary | ICD-10-CM

## 2017-10-28 DIAGNOSIS — R351 Nocturia: Secondary | ICD-10-CM

## 2017-10-28 DIAGNOSIS — R51 Headache: Secondary | ICD-10-CM

## 2017-10-28 DIAGNOSIS — G4733 Obstructive sleep apnea (adult) (pediatric): Secondary | ICD-10-CM

## 2017-10-28 DIAGNOSIS — R002 Palpitations: Secondary | ICD-10-CM

## 2017-10-28 DIAGNOSIS — Z6841 Body Mass Index (BMI) 40.0 and over, adult: Secondary | ICD-10-CM

## 2017-10-28 DIAGNOSIS — G4720 Circadian rhythm sleep disorder, unspecified type: Secondary | ICD-10-CM

## 2017-10-28 DIAGNOSIS — G4719 Other hypersomnia: Secondary | ICD-10-CM

## 2017-10-30 ENCOUNTER — Telehealth: Payer: Self-pay

## 2017-10-30 NOTE — Addendum Note (Signed)
Addended by: [name] on: 10/30/2017 08:03 AM   Modules accepted: Orders

## 2017-10-30 NOTE — Procedures (Signed)
PATIENT'S NAME:  Brooke [name], Brooke [name] DOB:      Oct 01, 1970      MR#:    [id number]     DATE OF RECORDING: 10/28/2017 REFERRING M.D.:  Minette Brine, FNP Study Performed:   CPAP  Titration HISTORY: 47 year old woman with a history of migraine headaches, arthritis, anxiety, anemia, allergies, history of asthma, and morbid obesity, who returns for a CPAP titration following her PSG on 09/12/17, which showed an AHI of 15.5/hour, and low spo2 of 71%. The patient endorsed the Epworth Sleepiness Scale at 15 points, BMI of 48.7 kg/m2. The patient's neck circumference measured 17.5 inches.  CURRENT MEDICATIONS: B Complex, Oscal, Restasis, Microzide, Vyvanse, Magnesium Gluconate, Melatonin, Multivitamin, Zoloft, Valtrex, Vitamin D.   PROCEDURE:  This is a multichannel digital polysomnogram utilizing the SomnoStar 11.2 system.  Electrodes and sensors were applied and monitored per AASM Specifications.   EEG, EOG, Chin and Limb EMG, were sampled at 200 Hz.  ECG, Snore and Nasal Pressure, Thermal Airflow, Respiratory Effort, CPAP Flow and Pressure, Oximetry was sampled at 50 Hz. Digital video and audio were recorded.      The patient was fitted with small P10 nasal pillows. CPAP was initiated at 5 cmH20 with heated humidity per AASM standards and pressure was advanced to 7 cmH20 because of hypopneas, apneas and desaturations.  At a PAP pressure of 7 cmH20, there was a reduction of the AHI to 0/hour with some supine NREM sleep achieved, and O2 nadir of 92%.   Lights Out was at 22:38 and Lights On at 05:09. Total recording time (TRT) was 391 minutes, with a total sleep time (TST) of 314.5 minutes. The patient's sleep latency was 28.5 minutes, which is delayed mildly. REM latency was 167 minutes, which is delayed.  The sleep efficiency was 80.4%.    SLEEP ARCHITECTURE: WASO (Wake after sleep onset) was 47.5 minutes with mild to moderate sleep fragmentation noted.  There were 34 minutes in Stage N1, 99.5 minutes Stage N2,  137.5 minutes Stage N3 and 43.5 minutes in Stage REM.  The percentage of Stage N1 was 10.8%, which is increased, Stage N2 was 31.6%, which is reduced, Stage N3 was 43.7%, which is increased, and Stage R (REM sleep) was 13.8%, which is reduced. The arousals were noted as: 75 were spontaneous, 0 were associated with PLMs, 0 were associated with respiratory events.  RESPIRATORY ANALYSIS:  There was a total of 2 respiratory events: 2 obstructive apneas, 0 central apneas and 0 mixed apneas with a total of 2 apneas and an apnea index (AI) of .4 /hour. There were 0 hypopneas with a hypopnea index of 0/hour. The patient also had 0 respiratory event related arousals (RERAs).      The total APNEA/HYPOPNEA INDEX  (AHI) was 0.4/hour and the total RESPIRATORY DISTURBANCE INDEX was .4 0./hour  2 events occurred in REM sleep and 0 events in NREM. The REM AHI was 2.8 /hour versus a non-REM AHI of 0 0./hour.  The patient spent 48 minutes of total sleep time in the supine position and 267 minutes in non-supine. The supine AHI was 0.0, versus a non-supine AHI of 0.5.  OXYGEN SATURATION & C02:  The baseline 02 saturation was 96%, with the lowest being 85%. Time spent below 89% saturation equaled 7 minutes.  PERIODIC LIMB MOVEMENTS:  The patient had a total of 0 Periodic Limb Movements. The Periodic Limb Movement (PLM) index was 0 and the PLM Arousal index was 0 /hour.  Audio and video analysis did  not show any abnormal or unusual movements, behaviors, phonations or vocalizations. The patient took no bathroom breaks. The EKG was in keeping with normal sinus rhythm (NSR).  Post-study, the patient indicated that sleep was better than usual.   IMPRESSION:   1. Obstructive Sleep Apnea (OSA) 2. Dysfunctions associated with sleep stages or arousal from sleep   RECOMMENDATIONS:   1. This study demonstrates resolution of the patient's obstructive sleep apnea with CPAP therapy. I will, therefore, start the patient on home  CPAP treatment at a pressure of 8 cm (due to mostly non-supine and only NREM sleep achieved on the final titration pressure of 7 cm) via small nasal pillows with heated humidity. The patient should be reminded to be fully compliant with PAP therapy to improve sleep related symptoms and decrease long term cardiovascular risks. The patient should be reminded, that it may take up to 3 months to get fully used to using PAP with all planned sleep. The earlier full compliance is achieved, the better long term compliance tends to be. Please note that untreated obstructive sleep apnea may carry additional perioperative morbidity. Patients with significant obstructive sleep apnea should receive perioperative PAP therapy and the surgeons and particularly the anesthesiologist should be informed of the diagnosis and the severity of the sleep disordered breathing. 2. This study shows sleep fragmentation and abnormal sleep stage percentages; these are nonspecific findings and per se do not signify an intrinsic sleep disorder or a cause for the patient's sleep-related symptoms. Causes include (but are not limited to) the first night effect of the sleep study, circadian rhythm disturbances, medication effect or an underlying mood disorder or medical problem.  3. The patient should be cautioned not to drive, work at heights, or operate dangerous or heavy equipment when tired or sleepy. Review and reiteration of good sleep hygiene measures should be pursued with any patient. 4. The patient will be seen in follow-up by Dr. Rexene Alberts at [facility] for discussion of the test results and further management strategies. The referring provider will be notified of the test results.   I certify that I have reviewed the entire raw data recording prior to the issuance of this report in accordance with the Standards of Accreditation of the American Academy of Sleep Medicine (AASM)     [name], MD, PhD Diplomat, American Board of Psychiatry and  Neurology (Neurology and Sleep Medicine)

## 2017-10-30 NOTE — Progress Notes (Signed)
Patient referred by Minette Brine, seen by me on 08/14/17, diagnostic PSG on 09/12/17, Patient had a CPAP titration study on 10/28/17.  Please call and inform patient that I have entered an order for treatment with positive airway pressure (PAP) treatment for obstructive sleep apnea (OSA). She did well during the latest sleep study with CPAP. We will, therefore, arrange for a machine for home use through a DME (durable medical equipment) company of Her choice; and I will see the patient back in follow-up in about 10 weeks. Please also explain to the patient that I will be looking out for compliance data, which can be downloaded from the machine (stored on an SD card, that is inserted in the machine) or via remote access through a modem, that is built into the machine. At the time of the followup appointment we will discuss sleep study results and how it is going with PAP treatment at home. Please advise patient to bring Her machine at the time of the first FU visit, even though this is cumbersome. Bringing the machine for every visit after that will likely not be needed, but often helps for the first visit to troubleshoot if needed. Please re-enforce the importance of compliance with treatment and the need for [place] to monitor compliance data - often an insurance requirement and actually good feedback for the patient as far as how they are doing.  Also remind patient, that any interim PAP machine or mask issues should be first addressed with the DME company, as they can often help better with technical and mask fit issues. Please ask if patient has a preference regarding DME company.  Please also make sure, the patient has a follow-up appointment with me in about 10 weeks from the setup date, thanks. May see one of our nurse practitioners if needed for proper timing of the FU appointment.  Please fax or rout report to the referring provider. Thanks,   [name], MD, PhD [facility])

## 2017-10-30 NOTE — Telephone Encounter (Signed)
-----   Message from [name], MD sent at 10/30/2017  8:03 AM EDT ----- Patient referred by Minette Brine, seen by me on 08/14/17, diagnostic PSG on 09/12/17, Patient had a CPAP titration study on 10/28/17.  Please call and inform patient that I have entered an order for treatment with positive airway pressure (PAP) treatment for obstructive sleep apnea (OSA). She did well during the latest sleep study with CPAP. We will, therefore, arrange for a machine for home use through a DME (durable medical equipment) company of Her choice; and I will see the patient back in follow-up in about 10 weeks. Please also explain to the patient that I will be looking out for compliance data, which can be downloaded from the machine (stored on an SD card, that is inserted in the machine) or via remote access through a modem, that is built into the machine. At the time of the followup appointment we will discuss sleep study results and how it is going with PAP treatment at home. Please advise patient to bring Her machine at the time of the first FU visit, even though this is cumbersome. Bringing the machine for every visit after that will likely not be needed, but often helps for the first visit to troubleshoot if needed. Please re-enforce the importance of compliance with treatment and the need for [place] to monitor compliance data - often an insurance requirement and actually good feedback for the patient as far as how they are doing.  Also remind patient, that any interim PAP machine or mask issues should be first addressed with the DME company, as they can often help better with technical and mask fit issues. Please ask if patient has a preference regarding DME company.  Please also make sure, the patient has a follow-up appointment with me in about 10 weeks from the setup date, thanks. May see one of our nurse practitioners if needed for proper timing of the FU appointment.  Please fax or rout report to the referring provider.  Thanks,   [name], MD, PhD [facility])

## 2017-10-30 NOTE — Telephone Encounter (Signed)
I called pt. I advised pt that Dr. Rexene Alberts reviewed their sleep study results and found that pt did well with the cpap during her latest sleep study. Dr. Rexene Alberts recommends that pt start a cpap at home. I reviewed PAP compliance expectations with the pt. Pt is agreeable to starting a CPAP. I advised pt that an order will be sent to a DME, [facility], and [facility] will call the pt within about one week after they file with the pt's insurance. [facility] will show the pt how to use the machine, fit for masks, and troubleshoot the CPAP if needed. A follow up appt was made for insurance purposes with Dr. Rexene Alberts on 01/29/18 at 3:00pm. Pt verbalized understanding to arrive 15 minutes early and bring their CPAP. A letter with all of this information in it will be mailed to the pt as a reminder. I verified with the pt that the address we have on file is correct. Pt verbalized understanding of results. Pt had no questions at this time but was encouraged to call back if questions arise.

## 2017-12-27 ENCOUNTER — Encounter: Payer: Self-pay | Admitting: Neurology

## 2018-01-27 ENCOUNTER — Encounter: Payer: Self-pay | Admitting: Neurology

## 2018-01-29 ENCOUNTER — Ambulatory Visit: Payer: Self-pay | Admitting: Neurology

## 2018-01-30 ENCOUNTER — Encounter: Payer: Self-pay | Admitting: Neurology

## 2018-01-30 ENCOUNTER — Ambulatory Visit: Payer: BC Managed Care – PPO | Admitting: Neurology

## 2018-01-30 VITALS — BP 151/100 | HR 74 | Ht 62.0 in | Wt 268.0 lb

## 2018-01-30 DIAGNOSIS — G4733 Obstructive sleep apnea (adult) (pediatric): Secondary | ICD-10-CM | POA: Diagnosis not present

## 2018-01-30 DIAGNOSIS — Z9989 Dependence on other enabling machines and devices: Secondary | ICD-10-CM | POA: Diagnosis not present

## 2018-01-30 NOTE — Patient Instructions (Signed)

## 2018-01-30 NOTE — Progress Notes (Signed)
Subjective:    Patient ID: Brooke [name] is a 47 y.o. female.  HPI     Interim history:   Brooke [name] is a 47 year old right-handed woman with an underlying medical history of migraine headaches, arthritis, anxiety, anemia, allergies, history of asthma, and morbid obesity with a BMI of over 60, who presents for follow-up consultation of her obstructive sleep apnea, after sleep study testing and starting CPAP therapy at home. The patient is unaccompanied today. She missed an appointment on 01/29/2018. I first met her on 08/14/2017 at the request of her primary care nurse practitioner, at which time the patient reported snoring and daytime somnolence. She was advised to proceed with sleep study testing. She has undergone a baseline sleep study followed by a CPAP titration study. I went over her test results with her in detail today. Baseline sleep study from 09/12/2017 showed a sleep latency of 23.5 minutes, REM latency delayed at 266 minutes, sleep efficiency of 85.4%. She had a fairly well preserved sleep architecture. Overall AHI was in the moderate range at 15.5 per hour, REM AHI was in the severe range at 38 per hour, supine AHI at 13.8 per hour. She had no significant PLMS, average oxygen saturation was 96%, nadir was 71%. Based on her medical history and test results she was advised to proceed with a full night CPAP titration study. She had this on 10/28/2017. Sleep latency was 28.5 minutes, REM latency 167 minutes, sleep efficiency 80.4%. She had an increased percentage of slow-wave sleep and REM sleep was decreased at 13.8%. She was fitted with nasal pillows and titrated on CPAP of 5 cm to 7 cm. Her AHI on the final setting was 0 per hour with supine non-REM sleep achieved an O2 nadir of 92%. Based on her test results I suggested we try a home CPAP pressure of 8 cm via nasal pillows.  Today, 01/30/2018: I reviewed her CPAP compliance data from 11/27/2017 through 12/27/2017 which is a total of  31 days, during which time she used her CPAP 25 days with percent used days greater than 4 hours at 77%, indicating adequate compliance with an average usage of 6 hours and 4 minutes, residual AHI at goal at 2.6 per hour, leak on the low side with the 95th percentile at 4.4 L/m on a pressure of 8 cm with EPR of 3. In the past month her compliance has decreased a little bit, she has used her machine 20 out of 30 days. She reports that she missed several days because of recent travel. She uses melatonin at night for sleep. She generally tries to take her machine on her travels. She would say that she has seen some improvement in her headaches but not consistently but she also noticed an increase in her blood pressure values lately. She is motivated to work on weight loss. She does wake up a little better rested, does not feel is sluggish during the day. She is motivated to continue with treatment. She is using nasal pillows successfully, had some nasal irritation in the beginning but that has subsided.   Previously:  08/14/2017: (She) reports snoring and excessive daytime somnolence. I reviewed your office note from 07/02/2017, which you kindly included. Her Epworth sleepiness score is 15 out of 24, fatigue score is 34 out of 63. She reports not waking up rested, sometimes she feels slowing cognition. She wakes up with muscle stiffness. She is single and lives alone, she works for the [employer] system. She  is a nonsmoker and drinks alcohol rarely, caffeine in the form of coffee 1 large cup per day on average.  She has a stressful drop. 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 She used to be principal for 5 years but it caused her too much stress, at which point she had to take a step back. She was able to lose weight a few years ago in the realm of 50 pounds but slowly gained it back. She has nocturia about once or twice per average night, she has woken up with a headache. Years ago, when she  was still principal at her school she tried something for sleep, possibly trazodone. She currently takes melatonin every night. Bedtime is around 11 or 12, rise time around 5:30. She has no pets. She typically sleeps on her sides. She does not have an official family history of sleep apnea but suspects that her father may have sleep apnea. She has had sleep paralysis before, less now than some years ago. She has had rare enuresis.  Her Past Medical History Is Significant For: Past Medical History:  Diagnosis Date  . Allergy    allergic rhinitis  . Anemia    nos  . Anxiety   . Arthritis    right ankle  . Asthma    as a child  . Elevated blood pressure reading without diagnosis of hypertension   . Genital herpes   . Migraine     Her Past Surgical History Is Significant For: Past Surgical History:  Procedure Laterality Date  . ANKLE SURGERY      Her Family History Is Significant For: Family History  Problem Relation Age of Onset  . Hypertension Mother   . Hypertension Father   . Hypertension Unknown   . Diabetes Maternal Aunt   . Diabetes Maternal Uncle   . Heart failure Paternal Aunt   . Diabetes Paternal Aunt   . Heart failure Maternal Grandmother   . Heart failure Maternal Grandfather     Her Social History Is Significant For: Social History   Socioeconomic History  . Marital status: Single    Spouse name: Not on file  . Number of children: Not on file  . Years of education: Not on file  . Highest education level: Not on file  Occupational History  . Not on file  Social Needs  . Financial resource strain: Not on file  . Food insecurity:    Worry: Not on file    Inability: Not on file  . Transportation needs:    Medical: Not on file    Non-medical: Not on file  Tobacco Use  . Smoking status: Never Smoker  . Smokeless tobacco: Never Used  Substance and Sexual Activity  . Alcohol use: No  . Drug use: No  . Sexual activity: Not on file  Lifestyle  .  Physical activity:    Days per week: Not on file    Minutes per session: Not on file  . Stress: Not on file  Relationships  . Social connections:    Talks on phone: Not on file    Gets together: Not on file    Attends religious service: Not on file    Active member of club or organization: Not on file    Attends meetings of clubs or organizations: Not on file    Relationship status: Not on file  Other Topics Concern  . Not on file  Social History Narrative  . Not on file  Her Allergies Are:  No Known Allergies:   Her Current Medications Are:  Outpatient Encounter Medications as of 01/30/2018  Medication Sig  . b complex vitamins tablet Take 1 tablet by mouth daily.  . calcium carbonate (OSCAL) 1500 (600 Ca) MG TABS tablet Take 1 tablet by mouth daily.  . cycloSPORINE (RESTASIS) 0.05 % ophthalmic emulsion 1 drop 2 (two) times daily.  . hydrochlorothiazide (MICROZIDE) 12.5 MG capsule Take 1 capsule by mouth daily.  Marland Kitchen lisdexamfetamine (VYVANSE) 40 MG capsule Take 40 mg by mouth every morning.  Marland Kitchen MAGNESIUM GLUCONATE PO Take by mouth.  . Melatonin 5 MG TABS Take 10 mg by mouth at bedtime.   . Multiple Vitamin (MULTIVITAMIN) tablet Take 1 tablet by mouth daily.  Marland Kitchen SAXENDA 18 MG/3ML SOPN   . sertraline (ZOLOFT) 100 MG tablet Take 100 mg by mouth daily.  . valACYclovir (VALTREX) 500 MG tablet Take 1 tablet by mouth every 12 hours x 3 days  . VITAMIN D, CHOLECALCIFEROL, PO Take 5,000 Units by mouth daily.   No facility-administered encounter medications on file as of 01/30/2018.   :  Review of Systems:  Out of a complete 14 point review of systems, all are reviewed and negative with the exception of these symptoms as listed below: Review of Systems  Neurological:       Patient reports that she has been traveling a lot and has not used her machine very much. She is also needing to take a lot of melatonin to get to sleep at night.     Objective:  Neurological Exam  Physical  Exam Physical Examination:   Vitals:   01/30/18 1536  BP: (!) 151/100  Pulse: 74    General Examination: The patient is a very pleasant 47 y.o. female in no acute distress. She appears well-developed and well-nourished and well groomed.   HEENT: Normocephalic, atraumatic, pupils are equal, round and reactive to light and accommodation. She wears corrective eyeglasses. Extraocular tracking is good without limitation to gaze excursion or nystagmus noted. Normal smooth pursuit is noted. Hearing is grossly intact. Face is symmetric with normal facial animation and normal facial sensation. Speech is clear with no dysarthria noted. There is no hypophonia. There is no lip, neck/head, jaw or voice tremor. Neck is supple with full range of passive and active motion. Oropharynx exam reveals: mild mouth dryness, good dental hygiene and moderate airway crowding. Tongue protrudes centrally and palate elevates symmetrically.    Chest: Clear to auscultation without wheezing, rhonchi or crackles noted.  Heart: S1+S2+0, regular and normal without murmurs, rubs or gallops noted.   Abdomen: Soft, non-tender and non-distended with normal bowel sounds appreciated on auscultation.  Extremities: There is no pitting edema in the distal lower extremities bilaterally.   Skin: Warm and dry without trophic changes noted.  Musculoskeletal: exam reveals no obvious joint deformities, tenderness or joint swelling or erythema.   Neurologically:  Mental status: The patient is awake, alert and oriented in all 4 spheres. Her immediate and remote memory, attention, language skills and fund of knowledge are appropriate. There is no evidence of aphasia, agnosia, apraxia or anomia. Speech is clear with normal prosody and enunciation. Thought process is linear. Mood is normal and affect is normal.  Cranial nerves II - XII are as described above under HEENT exam.  Motor exam: Normal bulk, strength and tone is noted. There  is no tremor. Fine motor skills and coordination: grossly intact.  Cerebellar testing: No dysmetria or intention tremor. There is  no truncal or gait ataxia.  Sensory exam: intact to light touch in the upper and lower extremities.  Gait, station and balance: She stands easily. No veering to one side is noted. No leaning to one side is noted. Posture is age-appropriate and stance is narrow based. Gait shows normal stride length and normal pace. No problems turning are noted. Tandem walk is unremarkable.  Assessment and plan:  In summary, Brooke [name] is a very pleasant 47 year old female with an underlying medical history of migraine headaches, arthritis, anxiety, anemia, allergies, history of asthma, and morbid obesity with a BMI of over 59, who presents for follow-up consultation of her obstructive sleep apnea, after recent sleep study testing. She had a baseline sleep study in March 2019 which indicated moderate to severe obstructive sleep apnea. She had a CPAP titration study in May 2019 which showed reasonably good results on CPAP of 7 cm as the final titration pressure but she did not achieve any REM sleep and minimal supine sleep on that final pressure. She was advised to start a home CPAP therapy at a pressure of 8 cm. She has been compliant with treatment and has noticed improvement in her sleep quality sleep consolidation and daytime symptoms, some improvement in her headaches but is not always as consistent with using her machine. She has been traveling a lot this summer. Nevertheless, she is encouraged to be fully compliant with CPAP therapy, and also strongly encouraged to continue to work on weight loss. She is encouraged to talk to her primary care physician about medical weight loss options. She is commended for treatment adherence. She is advised to follow-up in 6 months, sooner if needed. Hopefully, if she continues to do well she can be seen on a yearly basis after that. I answered all  her questions today and she was in agreement. I spent 25 minutes in total face-to-face time with the patient, more than 50% of which was spent in counseling and coordination of care, reviewing test results, reviewing medication and discussing or reviewing the diagnosis of OSA, its prognosis and treatment options. Pertinent laboratory and imaging test results that were available during this visit with the patient were reviewed by me and considered in my medical decision making (see chart for details).

## 2018-04-22 ENCOUNTER — Other Ambulatory Visit: Payer: Self-pay | Admitting: Obstetrics and Gynecology

## 2018-04-22 ENCOUNTER — Other Ambulatory Visit (HOSPITAL_COMMUNITY)
Admission: RE | Admit: 2018-04-22 | Discharge: 2018-04-22 | Disposition: A | Discharge status: Home / Self Care | Payer: BC Managed Care – PPO | Source: Ambulatory Visit | Attending: Obstetrics and Gynecology | Admitting: Obstetrics and Gynecology

## 2018-04-22 DIAGNOSIS — Z01411 Encounter for gynecological examination (general) (routine) with abnormal findings: Secondary | ICD-10-CM | POA: Insufficient documentation

## 2018-04-25 LAB — CYTOLOGY - PAP
Diagnosis: NEGATIVE
HPV: NOT DETECTED

## 2018-04-26 ENCOUNTER — Ambulatory Visit: Payer: BC Managed Care – PPO | Admitting: Internal Medicine

## 2018-04-26 ENCOUNTER — Encounter: Payer: Self-pay | Admitting: Internal Medicine

## 2018-04-26 ENCOUNTER — Other Ambulatory Visit: Payer: Self-pay

## 2018-04-26 VITALS — BP 122/84 | HR 81 | Temp 98.2°F | Ht 62.0 in | Wt 276.4 lb

## 2018-04-26 DIAGNOSIS — I10 Essential (primary) hypertension: Secondary | ICD-10-CM

## 2018-04-26 DIAGNOSIS — F988 Other specified behavioral and emotional disorders with onset usually occurring in childhood and adolescence: Secondary | ICD-10-CM | POA: Diagnosis not present

## 2018-04-26 DIAGNOSIS — Z6841 Body Mass Index (BMI) 40.0 and over, adult: Secondary | ICD-10-CM

## 2018-04-26 DIAGNOSIS — Z566 Other physical and mental strain related to work: Secondary | ICD-10-CM

## 2018-04-26 DIAGNOSIS — Z23 Encounter for immunization: Secondary | ICD-10-CM | POA: Diagnosis not present

## 2018-04-26 DIAGNOSIS — R002 Palpitations: Secondary | ICD-10-CM

## 2018-04-26 MED ORDER — MAGNESIUM GLYCINATE POWD: 400.0000 mg | Freq: Every day | 0 refills | Status: DC

## 2018-04-26 MED ORDER — LISDEXAMFETAMINE DIMESYLATE 40 MG PO CAPS: 40.0000 mg | ORAL_CAPSULE | ORAL | 0 refills | Status: DC

## 2018-04-26 MED ORDER — VALACYCLOVIR HCL 500 MG PO TABS: ORAL_TABLET | ORAL | 1 refills | Status: DC

## 2018-04-26 NOTE — Progress Notes (Signed)
Subjective:     Patient ID: Brooke [name] , female    DOB: Nov 11, 1970 , 47 y.o.   MRN: [id number]   Chief Complaint  Patient presents with  . Hypertension    HPI  Hypertension  This is a chronic problem. The current episode started more than 1 month ago. The problem has been gradually improving since onset. The problem is uncontrolled. Associated symptoms include palpitations (she has occ. palpitations. unable to state what triggers her sx. no cp, sob. ). Risk factors for coronary artery disease include sedentary lifestyle, obesity and stress. The current treatment provides moderate improvement. Compliance problems include exercise.    She reports compliance with meds. Admits she is not exercising as she should. Also under a lot of stress at work.   Past Medical History:  Diagnosis Date  . Allergy    allergic rhinitis  . Anemia    nos  . Anxiety   . Arthritis    right ankle  . Asthma    as a child  . Elevated blood pressure reading without diagnosis of hypertension   . Genital herpes   . Migraine      Family History  Problem Relation Age of Onset  . Hypertension Mother   . Hypertension Father   . Hypertension Unknown   . Diabetes Maternal Aunt   . Diabetes Maternal Uncle   . Heart failure Paternal Aunt   . Diabetes Paternal Aunt   . Heart failure Maternal Grandmother   . Heart failure Maternal Grandfather      Current Outpatient Medications:  .  calcium carbonate (OSCAL) 1500 (600 Ca) MG TABS tablet, Take 1 tablet by mouth daily., Disp: , Rfl:  .  cycloSPORINE (RESTASIS) 0.05 % ophthalmic emulsion, 1 drop 2 (two) times daily., Disp: , Rfl:  .  hydrochlorothiazide (MICROZIDE) 12.5 MG capsule, Take 1 capsule by mouth daily., Disp: , Rfl:  .  lisdexamfetamine (VYVANSE) 40 MG capsule, Take 1 capsule (40 mg total) by mouth every morning., Disp: 30 capsule, Rfl: 0 .  Melatonin 5 MG TABS, Take 10 mg by mouth at bedtime. , Disp: , Rfl:  .  Multiple Vitamin  (MULTIVITAMIN) tablet, Take 1 tablet by mouth daily., Disp: , Rfl:  .  sertraline (ZOLOFT) 100 MG tablet, Take 100 mg by mouth daily., Disp: , Rfl:  .  VITAMIN D, CHOLECALCIFEROL, PO, Take 5,000 Units by mouth daily., Disp: , Rfl:  .  Magnesium Glycinate POWD, 400 mg by Does not apply route daily., Disp: 100 g, Rfl: 0 .  valACYclovir (VALTREX) 500 MG tablet, Take 1 tablet by mouth daily prn, Disp: 90 tablet, Rfl: 1   No Known Allergies   Review of Systems  Constitutional: Negative.   HENT: Negative.   Cardiovascular: Positive for palpitations (she has occ. palpitations. unable to state what triggers her sx. no cp, sob. ).  Gastrointestinal: Negative.   Genitourinary: Negative.   Skin: Negative.   Neurological: Negative.   Hematological: Negative.   Psychiatric/Behavioral: Negative.      Today's Vitals   04/26/18 1527  BP: 122/84  Pulse: 81  Temp: 98.2 F (36.8 C)  TempSrc: Oral  Weight: 276 lb 6.4 oz (125.4 kg)  Height: _0  (1.575 m)   Body mass index is 50.55 kg/m.   Objective:  Physical Exam  Constitutional: She is oriented to person, place, and time. She appears well-developed and well-nourished.  HENT:  Head: Normocephalic and atraumatic.  Eyes: EOM are normal.  Cardiovascular: Normal rate,  regular rhythm and normal heart sounds.  Pulmonary/Chest: Effort normal and breath sounds normal.  Neurological: She is alert and oriented to person, place, and time.  Skin: Skin is warm and dry.  Psychiatric: She has a normal mood and affect.  Nursing note and vitals reviewed.       Assessment And Plan:     1. Essential hypertension, benign  Controlled here today. However, she reports elevated readings at home. I will add Bystolic 15m nightly to her current regimen. She will rto in six weeks for re-evaluation. She is encouraged to limit her salt intake and to incorporate more exercise into her daily routine.  - CMP14+EGFR - Hemoglobin A1c  2.  Palpitations  Intemittent.  She is encouraged to start MG supplementation nightly. Pt also advised this can also be a symptom associated with menopause. She is also encouraged to stay well hydrated.   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 She is encouraged to take meds as prescribed. She was given refill. She will rto in  313monthfor re-evaluation.   4. Stress at work  She agrees to schedule f/u appt with Dr. FuHart Carwint [facility]. She is advised that therapy will help her develop better coping skills to deal with her work challenges. She is reminded the challenges will always be there, she must change how she responds to these challenges.    5. Morbid obesity with BMI of 50.0-59.9, [facility]. She is encouraged to incorporate 1501mweek of regular exercise into her daily routine. She is encouraged to initially strive for BMI less than 45 to decrease cardiac risk.   - Amb Ref to Medical Weight Management  6. Need for vaccination  - Flu Vaccine QUAD 6+ mos PF IM (Fluarix Quad PF)   RobMaximino GreenlandD

## 2018-04-26 NOTE — Patient Instructions (Signed)

## 2018-04-27 ENCOUNTER — Encounter: Payer: Self-pay | Admitting: Internal Medicine

## 2018-04-27 LAB — CMP14+EGFR
ALT: 15 IU/L (ref 0–32)
AST: 20 IU/L (ref 0–40)
Albumin/Globulin Ratio: 1.1 — ABNORMAL LOW (ref 1.2–2.2)
Albumin: 4.1 g/dL (ref 3.5–5.5)
Alkaline Phosphatase: 105 IU/L (ref 39–117)
BUN/Creatinine Ratio: 14 (ref 9–23)
BUN: 11 mg/dL (ref 6–24)
Bilirubin Total: 0.3 mg/dL (ref 0.0–1.2)
CO2: 26 mmol/L (ref 20–29)
Calcium: 9.4 mg/dL (ref 8.7–10.2)
Chloride: 99 mmol/L (ref 96–106)
Creatinine, Ser: 0.77 mg/dL (ref 0.57–1.00)
GFR calc Af Amer: 106 mL/min/{1.73_m2} (ref >59)
GFR calc non Af Amer: 92 mL/min/{1.73_m2} (ref >59)
Globulin, Total: 3.6 g/dL (ref 1.5–4.5)
Glucose: 88 mg/dL (ref 65–99)
Potassium: 3.7 mmol/L (ref 3.5–5.2)
Sodium: 140 mmol/L (ref 134–144)
Total Protein: 7.7 g/dL (ref 6.0–8.5)

## 2018-04-27 LAB — HEMOGLOBIN A1C
Est. average glucose Bld gHb Est-mCnc: 114 mg/dL
Hgb A1c MFr Bld: 5.6 % (ref 4.8–5.6)

## 2018-04-27 NOTE — Progress Notes (Signed)
Here are your lab results:  Your liver and kidney function are normal. Your hba1c is 5.6, you are NOT prediabetic. Congrats!  Please be sure to exercise no less than five days weekly. I look forward to seeing you at your next visit.   Sincerely,    Robyn N. Baird Cancer, MD

## 2018-04-28 ENCOUNTER — Encounter: Payer: Self-pay | Admitting: Internal Medicine

## 2018-05-30 ENCOUNTER — Ambulatory Visit: Payer: BC Managed Care – PPO | Admitting: Internal Medicine

## 2018-05-30 ENCOUNTER — Encounter: Payer: Self-pay | Admitting: Internal Medicine

## 2018-05-30 VITALS — BP 110/66 | HR 72 | Temp 98.0°F | Ht 62.0 in | Wt 281.2 lb

## 2018-05-30 DIAGNOSIS — F988 Other specified behavioral and emotional disorders with onset usually occurring in childhood and adolescence: Secondary | ICD-10-CM | POA: Diagnosis not present

## 2018-05-30 DIAGNOSIS — Z6841 Body Mass Index (BMI) 40.0 and over, adult: Secondary | ICD-10-CM

## 2018-05-30 DIAGNOSIS — Z760 Encounter for issue of repeat prescription: Secondary | ICD-10-CM | POA: Diagnosis not present

## 2018-05-30 DIAGNOSIS — I10 Essential (primary) hypertension: Secondary | ICD-10-CM | POA: Diagnosis not present

## 2018-05-30 MED ORDER — VILAZODONE HCL 20 MG PO TABS: ORAL_TABLET | ORAL | 1 refills | Status: DC

## 2018-05-30 MED ORDER — LISDEXAMFETAMINE DIMESYLATE 40 MG PO CAPS: 40.0000 mg | ORAL_CAPSULE | ORAL | 0 refills | Status: DC

## 2018-05-30 NOTE — Patient Instructions (Signed)
Exercising to Stay Healthy Exercising regularly is important. It has many health benefits, such as:  Improving your overall fitness, flexibility, and endurance.  Increasing your bone density.  Helping with weight control.  Decreasing your body fat.  Increasing your muscle strength.  Reducing stress and tension.  Improving your overall health.  In order to become healthy and stay healthy, it is recommended that you do moderate-intensity and vigorous-intensity exercise. You can tell that you are exercising at a moderate intensity if you have a higher heart rate and faster breathing, but you are still able to hold a conversation. You can tell that you are exercising at a vigorous intensity if you are breathing much harder and faster and cannot hold a conversation while exercising. How often should I exercise? Choose an activity that you enjoy and set realistic goals. Your health care provider can help you to make an activity plan that works for you. Exercise regularly as directed by your health care provider. This may include:  Doing resistance training twice each week, such as: ? Push-ups. ? Sit-ups. ? Lifting weights. ? Using resistance bands.  Doing a given intensity of exercise for a given amount of time. Choose from these options: ? 150 minutes of moderate-intensity exercise every week. ? 75 minutes of vigorous-intensity exercise every week. ? A mix of moderate-intensity and vigorous-intensity exercise every week.  Children, pregnant women, people who are out of shape, people who are overweight, and older adults may need to consult a health care provider for individual recommendations. If you have any sort of medical condition, be sure to consult your health care provider before starting a new exercise program. What are some exercise ideas? Some moderate-intensity exercise ideas include:  Walking at a rate of 1 mile in 15  minutes.  Biking.  Hiking.  Golfing.  Dancing.  Some vigorous-intensity exercise ideas include:  Walking at a rate of at least 4.5 miles per hour.  Jogging or running at a rate of 5 miles per hour.  Biking at a rate of at least 10 miles per hour.  Lap swimming.  Roller-skating or in-line skating.  Cross-country skiing.  Vigorous competitive sports, such as football, basketball, and soccer.  Jumping rope.  Aerobic dancing.  What are some everyday activities that can help me to get exercise?  Yard work, such as: ? Pushing a lawn mower. ? Raking and bagging leaves.  Washing and waxing your car.  Pushing a stroller.  Shoveling snow.  Gardening.  Washing windows or floors. How can I be more active in my day-to-day activities?  Use the stairs instead of the elevator.  Take a walk during your lunch break.  If you drive, park your car farther away from work or school.  If you take public transportation, get off one stop early and walk the rest of the way.  Make all of your phone calls while standing up and walking around.  Get up, stretch, and walk around every 30 minutes throughout the day. What guidelines should I follow while exercising?  Do not exercise so much that you hurt yourself, feel dizzy, or get very short of breath.  Consult your health care provider before starting a new exercise program.  Wear comfortable clothes and shoes with good support.  Drink plenty of water while you exercise to prevent dehydration or heat stroke. Body water is lost during exercise and must be replaced.  Work out until you breathe faster and your heart beats faster. This information is not   intended to replace advice given to you by your health care provider. Make sure you discuss any questions you have with your health care provider. Document Released: 07/15/2010 Document Revised: 11/18/2015 Document Reviewed: 11/13/2013 Elsevier Interactive Patient Education  2018  Elsevier Inc[employer]  

## 2018-06-02 ENCOUNTER — Encounter: Payer: Self-pay | Admitting: Internal Medicine

## 2018-06-03 ENCOUNTER — Encounter: Payer: Self-pay | Admitting: Internal Medicine

## 2018-06-05 ENCOUNTER — Ambulatory Visit: Payer: BC Managed Care – PPO | Admitting: Internal Medicine

## 2018-06-10 NOTE — Progress Notes (Signed)
Subjective:     Patient ID: Brooke [name] , female    DOB: August 06, 1970 , 47 y.o.   MRN: [id number]   Chief Complaint  Patient presents with  . Hypertension  . Medication Refill    HPI  She needs refill of Vyvanse. She feels great on this medication. She feels she has more focus and has been more productive at work.   Hypertension  This is a chronic problem. The current episode started more than 1 year ago. The problem has been gradually improving since onset. The problem is controlled. Pertinent negatives include no blurred vision, chest pain, headaches, palpitations or shortness of breath. Risk factors for coronary artery disease include obesity and sedentary lifestyle.  Medication Refill  Pertinent negatives include no chest pain or headaches.     Past Medical History:  Diagnosis Date  . Allergy    allergic rhinitis  . Anemia    nos  . Anxiety   . Arthritis    right ankle  . Asthma    as a child  . Elevated blood pressure reading without diagnosis of hypertension   . Genital herpes   . Migraine      Family History  Problem Relation Age of Onset  . Hypertension Mother   . Hypertension Father   . Hypertension Unknown   . Diabetes Maternal Aunt   . Diabetes Maternal Uncle   . Heart failure Paternal Aunt   . Diabetes Paternal Aunt   . Heart failure Maternal Grandmother   . Heart failure Maternal Grandfather      Current Outpatient Medications:  .  calcium carbonate (OSCAL) 1500 (600 Ca) MG TABS tablet, Take 1 tablet by mouth daily., Disp: , Rfl:  .  cycloSPORINE (RESTASIS) 0.05 % ophthalmic emulsion, 1 drop 2 (two) times daily., Disp: , Rfl:  .  hydrochlorothiazide (MICROZIDE) 12.5 MG capsule, Take 1 capsule by mouth daily., Disp: , Rfl:  .  lisdexamfetamine (VYVANSE) 40 MG capsule, Take 1 capsule (40 mg total) by mouth every morning., Disp: 30 capsule, Rfl: 0 .  Magnesium Glycinate POWD, 400 mg by Does not apply route daily., Disp: 100 g, Rfl: 0 .   Melatonin 5 MG TABS, Take 10 mg by mouth at bedtime. , Disp: , Rfl:  .  Multiple Vitamin (MULTIVITAMIN) tablet, Take 1 tablet by mouth daily., Disp: , Rfl:  .  nebivolol (BYSTOLIC) 5 MG tablet, Take 5 mg by mouth daily., Disp: , Rfl:  .  sertraline (ZOLOFT) 100 MG tablet, Take 100 mg by mouth daily., Disp: , Rfl:  .  valACYclovir (VALTREX) 500 MG tablet, Take 500 mg by mouth daily as needed., Disp: , Rfl:  .  VITAMIN D, CHOLECALCIFEROL, PO, Take 5,000 Units by mouth daily., Disp: , Rfl:  .  Vilazodone HCl (VIIBRYD) 20 MG TABS, One tab po qd, Disp: 30 tablet, Rfl: 1   No Known Allergies   Review of Systems  Constitutional: Negative.   Eyes: Negative for blurred vision.  Respiratory: Negative.  Negative for shortness of breath.   Cardiovascular: Negative.  Negative for chest pain and palpitations.  Gastrointestinal: Negative.   Neurological: Negative.  Negative for headaches.  Psychiatric/Behavioral: Negative.      Today's Vitals   05/30/18 1429  BP: 110/66  Pulse: 72  Temp: 98 F (36.7 C)  TempSrc: Oral  Weight: 281 lb 3.2 oz (127.6 kg)  Height: 5\' 2"  (1.575 m)   Body mass index is 51.43 kg/m.   Objective:  Physical Exam  Vitals signs reviewed.  Constitutional:      Appearance: Normal appearance. She is obese.  HENT:     Head: Normocephalic and atraumatic.  Cardiovascular:     Rate and Rhythm: Normal rate and regular rhythm.     Heart sounds: Normal heart sounds.  Pulmonary:     Effort: Pulmonary effort is normal.     Breath sounds: Normal breath sounds.  Skin:    General: Skin is warm and dry.  Neurological:     General: No focal deficit present.     Mental Status: She is alert.  Psychiatric:        Mood and Affect: Mood normal.         Assessment And Plan:     1. Essential hypertension, benign  Well controlled. She will continue with current meds. She is encouraged to avoid adding salt to her foods.   2. ADD (attention deficit disorder) without  hyperactivity  Chronic. She was given refill of Vyvanse. She will rto in 3 months for re=evaluation.   3. Class 3 severe obesity due to excess calories with serious comorbidity and body mass index (BMI) of 50.0 to 59.9 in adult [facility])  She is encouraged to strive for BMI less than 40 to decrease cardiac risk.  Importance of regular exercise was discussed with the patient. She is encouraged to exercise 30 minutes at least five days weekly. I also offered to refer her to [facility] clinic; however, she declines at this time.   Maximino Greenland, MD

## 2018-06-27 ENCOUNTER — Encounter: Payer: Self-pay | Admitting: Internal Medicine

## 2018-06-27 ENCOUNTER — Ambulatory Visit: Payer: BC Managed Care – PPO | Admitting: Internal Medicine

## 2018-06-27 VITALS — BP 132/84 | HR 61 | Temp 98.1°F | Ht 62.0 in | Wt 286.0 lb

## 2018-06-27 DIAGNOSIS — Z6841 Body Mass Index (BMI) 40.0 and over, adult: Secondary | ICD-10-CM

## 2018-06-27 DIAGNOSIS — I10 Essential (primary) hypertension: Secondary | ICD-10-CM | POA: Diagnosis not present

## 2018-06-27 DIAGNOSIS — F331 Major depressive disorder, recurrent, moderate: Secondary | ICD-10-CM

## 2018-06-27 DIAGNOSIS — E66813 Obesity, class 3: Secondary | ICD-10-CM

## 2018-06-27 DIAGNOSIS — N951 Menopausal and female climacteric states: Secondary | ICD-10-CM

## 2018-06-27 MED ORDER — LISDEXAMFETAMINE DIMESYLATE 40 MG PO CAPS: 40.0000 mg | ORAL_CAPSULE | ORAL | 0 refills | Status: DC

## 2018-06-27 NOTE — Patient Instructions (Signed)

## 2018-06-30 ENCOUNTER — Encounter: Payer: Self-pay | Admitting: Internal Medicine

## 2018-06-30 NOTE — Progress Notes (Signed)
Subjective:     Patient ID: Pricilla Loveless , female    DOB: 06/21/1971 , 48 y.o.   MRN: [id number]   Chief Complaint  Patient presents with  . Hypertension  . Vyvanse f/u  . Obesity    HPI  Hypertension  This is a chronic problem. The current episode started more than 1 year ago. The problem has been gradually improving since onset. The problem is controlled. Pertinent negatives include no blurred vision, chest pain, headaches, palpitations or shortness of breath.   She reports compliance with meds.   Vyvanse f/u  She is doing well on her current dose of Vyvanse. She reports compliance with meds. She reports that she definitely has improved focus while on the medication.  She is accompanied by her mother today.   Past Medical History:  Diagnosis Date  . Allergy    allergic rhinitis  . Anemia    nos  . Anxiety   . Arthritis    right ankle  . Asthma    as a child  . Elevated blood pressure reading without diagnosis of hypertension   . Genital herpes   . Migraine      Family History  Problem Relation Age of Onset  . Hypertension Mother   . Hypertension Father   . Hypertension Other   . Diabetes Maternal Aunt   . Diabetes Maternal Uncle   . Heart failure Paternal Aunt   . Diabetes Paternal Aunt   . Heart failure Maternal Grandmother   . Heart failure Maternal Grandfather      Current Outpatient Medications:  .  calcium carbonate (OSCAL) 1500 (600 Ca) MG TABS tablet, Take 1 tablet by mouth daily., Disp: , Rfl:  .  cycloSPORINE (RESTASIS) 0.05 % ophthalmic emulsion, 1 drop 2 (two) times daily., Disp: , Rfl:  .  hydrochlorothiazide (MICROZIDE) 12.5 MG capsule, Take 1 capsule by mouth daily., Disp: , Rfl:  .  lisdexamfetamine (VYVANSE) 40 MG capsule, Take 1 capsule (40 mg total) by mouth every morning., Disp: 30 capsule, Rfl: 0 .  Magnesium Glycinate POWD, 400 mg by Does not apply route daily., Disp: 100 g, Rfl: 0 .  Melatonin 5 MG TABS, Take 10 mg by mouth at  bedtime. , Disp: , Rfl:  .  Multiple Vitamin (MULTIVITAMIN) tablet, Take 1 tablet by mouth daily., Disp: , Rfl:  .  nebivolol (BYSTOLIC) 5 MG tablet, Take 5 mg by mouth daily., Disp: , Rfl:  .  valACYclovir (VALTREX) 500 MG tablet, Take 500 mg by mouth daily as needed., Disp: , Rfl:  .  VITAMIN D, CHOLECALCIFEROL, PO, Take 5,000 Units by mouth daily., Disp: , Rfl:    No Known Allergies   Review of Systems  Constitutional: Negative.   Eyes: Negative for blurred vision.  Respiratory: Negative.  Negative for shortness of breath.   Cardiovascular: Negative.  Negative for chest pain and palpitations.  Gastrointestinal: Negative.   Neurological: Negative.  Negative for headaches.  Psychiatric/Behavioral: Positive for dysphoric mood (states she does not like the way she feels on viibryd. she wants to stop it. reports she feels "different". she has no desire to harm herself. she is still feeling depressed. ) and sleep disturbance.     Today's Vitals   06/27/18 1005  BP: 132/84  Pulse: 61  Temp: 98.1 F (36.7 C)  TempSrc: Oral  Weight: 286 lb (129.7 kg)  Height: 5\' 2"  (1.575 m)   Body mass index is 52.31 kg/m.   Objective:  Physical  Exam Vitals signs and nursing note reviewed.  Constitutional:      Appearance: Normal appearance. She is obese.  HENT:     Head: Normocephalic and atraumatic.  Cardiovascular:     Rate and Rhythm: Normal rate and regular rhythm.     Heart sounds: Normal heart sounds.  Pulmonary:     Effort: Pulmonary effort is normal.     Breath sounds: Normal breath sounds.  Skin:    General: Skin is warm.  Neurological:     General: No focal deficit present.     Mental Status: She is alert.         Assessment And Plan:     1. Essential hypertension, benign  Controlled. She will continue with current meds. She is encouraged to avoid adding salt to her foods.   2. Moderate episode of recurrent major depressive disorder (HCC)  Chronic. She will decrease  to 20mg  1/2 tab Viibryd for the next several days. She did not tolerate this medication. She would like to switch to a new medication. I am limited in choices due to concomitant use of Vyvanse. We discussed returning to zoloft. She is also encouraged to continue with therapy.   3. Symptomatic menopausal or female climacteric states  I think this is compounding her symptoms of depression. She may benefit from compounded progesterone nightly to help her sleep, which could help to ameliorate her other symptoms.   4. Class 3 severe obesity due to excess calories without serious comorbidity with body mass index (BMI) of 50.0 to 59.9 in adult [facility])  She is now on the waiting list for [facility] clinic. She does not wish to pursue surgical treatment at this time.   Maximino Greenland, MD

## 2018-07-11 ENCOUNTER — Encounter (INDEPENDENT_AMBULATORY_CARE_PROVIDER_SITE_OTHER): Payer: BC Managed Care – PPO

## 2018-07-17 ENCOUNTER — Encounter (INDEPENDENT_AMBULATORY_CARE_PROVIDER_SITE_OTHER): Payer: Self-pay | Admitting: Family Medicine

## 2018-07-17 ENCOUNTER — Ambulatory Visit (INDEPENDENT_AMBULATORY_CARE_PROVIDER_SITE_OTHER): Payer: BC Managed Care – PPO | Admitting: Family Medicine

## 2018-07-17 VITALS — BP 149/98 | HR 61 | Temp 97.8°F | Ht 62.0 in | Wt 281.0 lb

## 2018-07-17 DIAGNOSIS — Z1331 Encounter for screening for depression: Secondary | ICD-10-CM | POA: Diagnosis not present

## 2018-07-17 DIAGNOSIS — R0602 Shortness of breath: Secondary | ICD-10-CM | POA: Diagnosis not present

## 2018-07-17 DIAGNOSIS — R5383 Other fatigue: Secondary | ICD-10-CM

## 2018-07-17 DIAGNOSIS — Z0289 Encounter for other administrative examinations: Secondary | ICD-10-CM

## 2018-07-17 DIAGNOSIS — Z6841 Body Mass Index (BMI) 40.0 and over, adult: Secondary | ICD-10-CM

## 2018-07-17 DIAGNOSIS — Z9189 Other specified personal risk factors, not elsewhere classified: Secondary | ICD-10-CM

## 2018-07-17 DIAGNOSIS — I10 Essential (primary) hypertension: Secondary | ICD-10-CM | POA: Diagnosis not present

## 2018-07-18 ENCOUNTER — Encounter: Payer: Self-pay | Admitting: Internal Medicine

## 2018-07-18 LAB — COMPREHENSIVE METABOLIC PANEL
ALT: 14 IU/L (ref 0–32)
AST: 16 IU/L (ref 0–40)
Albumin/Globulin Ratio: 1.3 (ref 1.2–2.2)
Albumin: 3.9 g/dL (ref 3.8–4.8)
Alkaline Phosphatase: 90 IU/L (ref 39–117)
BUN/Creatinine Ratio: 14 (ref 9–23)
BUN: 10 mg/dL (ref 6–24)
Bilirubin Total: 0.2 mg/dL (ref 0.0–1.2)
CO2: 26 mmol/L (ref 20–29)
Calcium: 9 mg/dL (ref 8.7–10.2)
Chloride: 99 mmol/L (ref 96–106)
Creatinine, Ser: 0.7 mg/dL (ref 0.57–1.00)
GFR calc Af Amer: 119 mL/min/{1.73_m2} (ref >59)
GFR calc non Af Amer: 104 mL/min/{1.73_m2} (ref >59)
Globulin, Total: 3.1 g/dL (ref 1.5–4.5)
Glucose: 85 mg/dL (ref 65–99)
Potassium: 4.2 mmol/L (ref 3.5–5.2)
Sodium: 138 mmol/L (ref 134–144)
Total Protein: 7 g/dL (ref 6.0–8.5)

## 2018-07-18 LAB — VITAMIN D 25 HYDROXY (VIT D DEFICIENCY, FRACTURES): Vit D, 25-Hydroxy: 49.1 ng/mL (ref 30.0–100.0)

## 2018-07-18 LAB — CBC WITH DIFFERENTIAL
Basophils Absolute: 0.1 10*3/uL (ref 0.0–0.2)
Basos: 1 %
EOS (ABSOLUTE): 0.1 10*3/uL (ref 0.0–0.4)
Eos: 1 %
Hematocrit: 37.7 % (ref 34.0–46.6)
Hemoglobin: 11.9 g/dL (ref 11.1–15.9)
Immature Grans (Abs): 0 10*3/uL (ref 0.0–0.1)
Immature Granulocytes: 0 %
Lymphocytes Absolute: 2 10*3/uL (ref 0.7–3.1)
Lymphs: 31 %
MCH: 25.1 pg — ABNORMAL LOW (ref 26.6–33.0)
MCHC: 31.6 g/dL (ref 31.5–35.7)
MCV: 80 fL (ref 79–97)
Monocytes Absolute: 0.4 10*3/uL (ref 0.1–0.9)
Monocytes: 7 %
Neutrophils Absolute: 4 10*3/uL (ref 1.4–7.0)
Neutrophils: 60 %
RBC: 4.74 x10E6/uL (ref 3.77–5.28)
RDW: 15.1 % (ref 11.7–15.4)
WBC: 6.6 10*3/uL (ref 3.4–10.8)

## 2018-07-18 LAB — INSULIN, RANDOM: INSULIN: 7.3 u[IU]/mL (ref 2.6–24.9)

## 2018-07-18 LAB — LIPID PANEL WITH LDL/HDL RATIO
Cholesterol, Total: 181 mg/dL (ref 100–199)
HDL: 59 mg/dL (ref >39)
LDL Calculated: 113 mg/dL — ABNORMAL HIGH (ref 0–99)
LDl/HDL Ratio: 1.9 ratio (ref 0.0–3.2)
Triglycerides: 44 mg/dL (ref 0–149)
VLDL Cholesterol Cal: 9 mg/dL (ref 5–40)

## 2018-07-18 LAB — TSH: TSH: 0.935 u[IU]/mL (ref 0.450–4.500)

## 2018-07-18 LAB — T4, FREE: Free T4: 0.82 ng/dL (ref 0.82–1.77)

## 2018-07-18 LAB — FOLATE: Folate: 17.8 ng/mL (ref >3.0)

## 2018-07-18 LAB — VITAMIN B12: Vitamin B-12: 533 pg/mL (ref 232–1245)

## 2018-07-18 LAB — T3: T3, Total: 123 ng/dL (ref 71–180)

## 2018-07-18 LAB — HEMOGLOBIN A1C
Est. average glucose Bld gHb Est-mCnc: 114 mg/dL
Hgb A1c MFr Bld: 5.6 % (ref 4.8–5.6)

## 2018-07-18 NOTE — Progress Notes (Signed)
Office: [phone]  /  Fax: [phone]   Dear Dr. Baird Cancer,   Thank you for referring Brooke [name] to our clinic. The following note includes my evaluation and treatment recommendations.  HPI:   Chief Complaint: Brooke [name] has been referred by Brooke Chard, MD for consultation regarding her obesity and obesity related comorbidities.    Brooke [name] (MR# [id number]) is a 48 y.o. female who presents on 07/17/2018 for obesity evaluation and treatment. Current BMI is Body mass index is 51.4 kg/m.Marland Kitchen Brooke [name] has been struggling with her weight for many years and has been unsuccessful in either losing weight, maintaining weight loss, or reaching her healthy weight goal.     Brooke [name] is lactose intolerant. She previously did HCG diet.     Brooke [name] attended our information session and states she is currently in the action stage of change and ready to dedicate time achieving and maintaining a healthier weight. Brooke [name] is interested in becoming our patient and working on intensive lifestyle modifications including (but not limited to) diet, exercise and weight loss.    Brooke [name] states her family eats meals together she thinks her family will eat healthier with  her her desired weight loss is 101-111 lbs she has been heavy most of  her life she started gaining weight in college her heaviest weight ever was 285 lbs she has significant food cravings issues  she snacks frequently in the evenings she skips meals frequently she is frequently drinking liquids with calories she frequently makes poor food choices she frequently eats larger portions than normal  she struggles with emotional eating    Brooke [name] feels her energy is lower than it should be. This has worsened with weight gain and has not worsened recently. Brooke [name] admits to daytime somnolence and  admits to waking up still tired. Patient has a history of obstructive sleep apnea without the  use of CPAP. Patent has a history of symptoms of daytime Brooke and morning headache. Patient generally gets 4 hours of sleep per night, and states they generally have nightime awakenings. Snoring is present. Apneic episodes are present. Epworth Sleepiness Score is 2.  Brooke [name] notes increasing shortness of breath with exercising and seems to be worsening over time with weight gain. She notes getting out of breath sooner with activity than she used to. This has not gotten worse recently. EKG-Normal sinus rhythm, left ventricular hypertrophy. Brooke [name] denies orthopnea.  Hypertension Brooke [name] is a 48 y.o. female with hypertension. Brooke [name] is on hydrochlorothiazide and Bystolic. He blood pressure is elevated today and she denies chest pain. She is working weight loss to help control her blood pressure with the goal of decreasing her risk of heart attack and stroke. Brooke [name]'s blood pressure is not currently controlled.  At risk for cardiovascular disease Brooke [name] is at a higher than average risk for cardiovascular disease due to obesity and hypertension. She currently denies any chest pain.  Depression Screen Brooke [name] Food and Mood (modified PHQ-9) score was  Depression screen PHQ 2/9 07/17/2018  Decreased Interest 1  Down, Depressed, Hopeless 1  PHQ - 2 Score 2  Altered sleeping 3  Tired, decreased energy 3  Change in appetite 3  Feeling bad or failure about yourself  2  Trouble concentrating 1  Moving slowly or fidgety/restless 0  Suicidal thoughts 0  PHQ-9 Score 14  Difficult doing work/chores Not difficult at all    ASSESSMENT AND PLAN:  Other Brooke - Plan:  EKG 12-Lead, Vitamin B12, CBC With Differential, Folate, Comprehensive metabolic panel, Hemoglobin A1c, T3, T4, free, TSH, VITAMIN D 25 Hydroxy (Vit-D Deficiency, Fractures)  Shortness of breath on exertion - Plan: Lipid Panel With LDL/HDL Ratio  Essential hypertension - Plan:  Comprehensive metabolic panel  Depression screening  At risk for heart disease  Class 3 severe obesity with serious comorbidity and body mass index (BMI) of 50.0 to 59.9 in adult, unspecified obesity type (HCC)  PLAN:  Brooke Brooke [name] was informed that her Brooke may be related to obesity, depression or many other causes. Labs will be ordered, and in the meanwhile Brooke [name] has agreed to work on diet, exercise and weight loss to help with Brooke. Proper sleep hygiene was discussed including the need for 7-8 hours of quality sleep each night. A sleep study was not ordered based on symptoms and Epworth score.  Brooke on exertion Brooke [name] shortness of breath appears to be obesity related and exercise induced. She has agreed to work on weight loss and gradually increase exercise to treat her exercise induced shortness of breath. If Brooke [name] follows our instructions and loses weight without improvement of her shortness of breath, we will plan to refer to pulmonology. We will monitor this condition regularly. Brooke [name] agrees to this plan.  Hypertension We discussed sodium restriction, working on healthy weight loss, and a regular exercise program as the means to achieve improved blood pressure control. Brooke [name] agreed with this plan and agreed to follow up as directed. We will continue to monitor her blood pressure as well as her progress with the above lifestyle modifications. Brooke [name] will continue her medications as prescribed and will watch for signs of hypotension as she continues her lifestyle modifications. We will check labs today. Brooke [name] agrees to follow up with our clinic in 2 weeks.  Cardiovascular risk counselling Brooke [name] was given extended (15 minutes) coronary artery disease prevention counseling today. She is 48 y.o. female and has risk factors for heart disease including obesity and hypertension. We discussed intensive lifestyle modifications today with an emphasis on  specific weight loss instructions and strategies. Pt was also informed of the importance of increasing exercise and decreasing saturated fats to help prevent heart disease.  Depression Screen Brooke [name] had a moderately positive depression screening. Depression is commonly associated with obesity and often results in emotional eating behaviors. We will monitor this closely and work on CBT to help improve the non-hunger eating patterns. Referral to Psychology may be required if no improvement is seen as she continues in our clinic.  Obesity Natalyia is currently in the action stage of change and her goal is to continue with weight loss efforts. I recommend Brooke [name] begin the structured treatment plan as follows:  She has agreed to follow the Category 2 plan + 100 calories Anahit has been instructed to eventually work up to a goal of 150 minutes of combined cardio and strengthening exercise per week for weight loss and overall health benefits. We discussed the following Behavioral Modification Strategies today: increasing lean protein intake, increasing vegetables, decrease eating out and work on meal planning and easy cooking plans   She was informed of the importance of frequent follow up visits to maximize her success with intensive lifestyle modifications for her multiple health conditions. She was informed we would discuss her lab results at her next visit unless there is a critical issue that needs to be addressed sooner. Dimitri agreed to keep her next visit at the agreed upon time to discuss these results.  ALLERGIES: No Known Allergies  MEDICATIONS: Current Outpatient Medications on File Prior to Visit  Medication Sig Dispense Refill  . calcium carbonate (OSCAL) 1500 (600 Ca) MG TABS tablet Take 1 tablet by mouth daily.    . hydrochlorothiazide (MICROZIDE) 12.5 MG capsule Take 1 capsule by mouth daily.    Marland Kitchen lisdexamfetamine (VYVANSE) 40 MG capsule Take 1 capsule (40 mg total) by  mouth every morning. 30 capsule 0  . Magnesium Glycinate POWD 400 mg by Does not apply route daily. 100 g 0  . Melatonin 5 MG TABS Take 10 mg by mouth at bedtime.     . Multiple Vitamin (MULTIVITAMIN) tablet Take 1 tablet by mouth daily.    . nebivolol (BYSTOLIC) 5 MG tablet Take 5 mg by mouth daily.    . valACYclovir (VALTREX) 500 MG tablet Take 500 mg by mouth daily as needed.    . Vilazodone HCl (VIIBRYD) 10 MG TABS Take 10 mg by mouth daily.    Marland Kitchen VITAMIN D, CHOLECALCIFEROL, PO Take 5,000 Units by mouth daily.     No current facility-administered medications on file prior to visit.     PAST MEDICAL HISTORY: Past Medical History:  Diagnosis Date  . ADD (attention deficit disorder)   . Allergy    allergic rhinitis  . Anemia    nos  . Anxiety   . Arthritis    right ankle  . Asthma    as a child  . Back pain   . Constipation   . Depression   . Elevated blood pressure reading without diagnosis of hypertension   . Genital herpes   . Lactose intolerance   . Migraine   . Prediabetes   . Sleep apnea   . Stress   . Swelling     PAST SURGICAL HISTORY: Past Surgical History:  Procedure Laterality Date  . ANKLE SURGERY      SOCIAL HISTORY: Social History   Tobacco Use  . Smoking status: Never Smoker  . Smokeless tobacco: Never Used  Substance Use Topics  . Alcohol use: No  . Drug use: No    FAMILY HISTORY: Family History  Problem Relation Age of Onset  . Hypertension Mother   . Diabetes Mother   . Hyperlipidemia Mother   . Hypertension Father   . Sleep apnea Father   . Alcoholism Father   . Obesity Father   . Hypertension Other   . Diabetes Maternal Aunt   . Diabetes Maternal Uncle   . Heart failure Paternal Aunt   . Diabetes Paternal Aunt   . Heart failure Maternal Grandmother   . Heart failure Maternal Grandfather     ROS: Review of Systems  Constitutional: Positive for malaise/Brooke. Negative for weight loss.       + Trouble sleeping  Eyes:        + Wear glasses or contacts + Floaters  Respiratory: Positive for cough (slight) and shortness of breath (with exertion).   Cardiovascular: Negative for chest pain and orthopnea.  Musculoskeletal: Positive for back pain.       + Muscle or joint pain  Psychiatric/Behavioral: Positive for depression. Negative for suicidal ideas. The patient is nervous/anxious.        + Stress    PHYSICAL EXAM: Blood pressure (!) 149/98, pulse 61, temperature 97.8 F (36.6 C), temperature source Oral, height 5\' 2"  (1.575 m), weight 281 lb (127.5 kg), last menstrual period 06/20/2018, SpO2 100 %. Body mass index is 51.4 kg/m. Physical Exam Vitals signs  reviewed.  Constitutional:      Appearance: Normal appearance. She is obese.  HENT:     Head: Normocephalic and atraumatic.     Nose: Nose normal.  Eyes:     General: No scleral icterus.    Extraocular Movements: Extraocular movements intact.  Neck:     Musculoskeletal: Normal range of motion and neck supple.     Comments: No thyromegaly present Cardiovascular:     Rate and Rhythm: Normal rate and regular rhythm.     Pulses: Normal pulses.     Heart sounds: Normal heart sounds.  Pulmonary:     Effort: Pulmonary effort is normal. No respiratory distress.     Breath sounds: Normal breath sounds.  Abdominal:     Palpations: Abdomen is soft.     Tenderness: There is no abdominal tenderness.     Comments: + Obesity  Musculoskeletal: Normal range of motion.     Right lower leg: No edema.     Left lower leg: No edema.  Skin:    General: Skin is warm and dry.  Neurological:     Mental Status: She is alert and oriented to person, place, and time.     Coordination: Coordination normal.  Psychiatric:        Mood and Affect: Mood normal.        Behavior: Behavior normal.     RECENT LABS AND TESTS: BMET    Component Value Date/Time   NA 138 07/17/2018 1313   K 4.2 07/17/2018 1313   CL 99 07/17/2018 1313   CO2 26 07/17/2018 1313   GLUCOSE  85 07/17/2018 1313   GLUCOSE 74 04/03/2016 1017   BUN 10 07/17/2018 1313   CREATININE 0.70 07/17/2018 1313   CREATININE 0.74 04/03/2016 1017   CALCIUM 9.0 07/17/2018 1313   GFRNONAA 104 07/17/2018 1313   GFRAA 119 07/17/2018 1313   Lab Results  Component Value Date   HGBA1C 5.6 07/17/2018   Lab Results  Component Value Date   INSULIN 7.3 07/17/2018   CBC    Component Value Date/Time   WBC 6.6 07/17/2018 1313   WBC 7.4 06/30/2011 1631   RBC 4.74 07/17/2018 1313   RBC 4.79 06/30/2011 1631   HGB 11.9 07/17/2018 1313   HCT 37.7 07/17/2018 1313   PLT 360 06/30/2011 1631   MCV 80 07/17/2018 1313   MCH 25.1 (L) 07/17/2018 1313   MCH 24.0 (L) 06/30/2011 1631   MCHC 31.6 07/17/2018 1313   MCHC 31.3 06/30/2011 1631   RDW 15.1 07/17/2018 1313   LYMPHSABS 2.0 07/17/2018 1313   MONOABS 0.5 06/30/2011 1631   EOSABS 0.1 07/17/2018 1313   BASOSABS 0.1 07/17/2018 1313   Iron/TIBC/Ferritin/ %Sat    Component Value Date/Time   IRON 41 (L) 12/17/2012 1440   FERRITIN 22.8 12/17/2012 1440   IRONPCTSAT 11.2 (L) 12/17/2012 1440   Lipid Panel     Component Value Date/Time   CHOL 181 07/17/2018 1313   TRIG 44 07/17/2018 1313   HDL 59 07/17/2018 1313   CHOLHDL 3.3 02/08/2011 1035   VLDL 8 02/08/2011 1035   LDLCALC 113 (H) 07/17/2018 1313   Hepatic Function Panel     Component Value Date/Time   PROT 7.0 07/17/2018 1313   ALBUMIN 3.9 07/17/2018 1313   AST 16 07/17/2018 1313   ALT 14 07/17/2018 1313   ALKPHOS 90 07/17/2018 1313   BILITOT 0.2 07/17/2018 1313   BILIDIR 0.1 02/08/2011 1035   IBILI 0.3 02/08/2011 1035  Component Value Date/Time   TSH 0.935 07/17/2018 1313   TSH 1.30 04/03/2016 1017   TSH 1.023 06/30/2011 1631    ECG  shows NSR with a rate of 65 BPM INDIRECT CALORIMETER done today shows a VO2 of 248 and a REE of 1723.  Her calculated basal metabolic rate is 8657 thus her basal metabolic rate is worse than expected.       OBESITY BEHAVIORAL  INTERVENTION VISIT  Today's visit was # 1   Starting weight: 281 lbs Starting date: 07/17/2018 Today's weight : 281 lbs  Today's date: 07/17/2018 Total lbs lost to date: 0    ASK: We discussed the diagnosis of obesity with Brooke [name] today and Aariyah agreed to give [place] permission to discuss obesity behavioral modification therapy today.  ASSESS: Nikiya has the diagnosis of obesity and her BMI today is 51.38 Torey is in the action stage of change   ADVISE: Cassadie was educated on the multiple health risks of obesity as well as the benefit of weight loss to improve her health. She was advised of the need for long term treatment and the importance of lifestyle modifications to improve her current health and to decrease her risk of future health problems.  AGREE: Multiple dietary modification options and treatment options were discussed and  Dayani agreed to follow the recommendations documented in the above note.  ARRANGE: Cigi was educated on the importance of frequent visits to treat obesity as outlined per CMS and USPSTF guidelines and agreed to schedule her next follow up appointment today.  I, Trixie Dredge, am acting as transcriptionist for Ilene Qua, MD  I have reviewed the above documentation for accuracy and completeness, and I agree with the above. - Ilene Qua, MD

## 2018-07-19 ENCOUNTER — Other Ambulatory Visit: Payer: Self-pay | Admitting: Internal Medicine

## 2018-07-19 ENCOUNTER — Encounter: Payer: Self-pay | Admitting: Internal Medicine

## 2018-07-19 MED ORDER — SERTRALINE HCL 50 MG PO TABS: 50.0000 mg | ORAL_TABLET | Freq: Every day | ORAL | 2 refills | Status: DC

## 2018-07-31 ENCOUNTER — Encounter (INDEPENDENT_AMBULATORY_CARE_PROVIDER_SITE_OTHER): Payer: Self-pay | Admitting: Family Medicine

## 2018-07-31 ENCOUNTER — Ambulatory Visit (INDEPENDENT_AMBULATORY_CARE_PROVIDER_SITE_OTHER): Payer: BC Managed Care – PPO | Admitting: Family Medicine

## 2018-07-31 VITALS — BP 131/86 | HR 70 | Temp 98.2°F | Ht 62.0 in | Wt 273.0 lb

## 2018-07-31 DIAGNOSIS — E8881 Metabolic syndrome: Secondary | ICD-10-CM

## 2018-07-31 DIAGNOSIS — E7849 Other hyperlipidemia: Secondary | ICD-10-CM | POA: Diagnosis not present

## 2018-07-31 DIAGNOSIS — E559 Vitamin D deficiency, unspecified: Secondary | ICD-10-CM | POA: Diagnosis not present

## 2018-07-31 DIAGNOSIS — Z6841 Body Mass Index (BMI) 40.0 and over, adult: Secondary | ICD-10-CM

## 2018-08-01 NOTE — Progress Notes (Signed)
Office: [phone]  /  Fax: [phone]   HPI:   Chief Complaint: OBESITY Brooke [name] is here to discuss her progress with her obesity treatment plan. She is on the Category 2 plan + 100 calories and is following her eating plan approximately 75 % of the time. She states she is exercising 0 minutes 0 times per week. Brooke [name] did better the first week because the second week she didn't do food prep. She has had hunger but that's because she has had to skip meals at work. When she followed the plan she was still hungry at night. She previously never cooked.  Her weight is 273 lb (123.8 kg) today and has had a weight loss of 8 pounds over a period of 2 weeks since her last visit. She has lost 8 lbs since starting treatment with [place].  Hyperlipidemia Brooke [name] has hyperlipidemia and has been trying to improve her cholesterol levels with intensive lifestyle modification including a low saturated fat diet, exercise and weight loss. Her LDL is elevated at 113 and she is not on statin. She denies any chest pain, claudication or myalgias.  Vitamin D Deficiency Brooke [name] has a diagnosis of vitamin D deficiency. She is on Vit D 5,000 IU daily. She notes fatigue and denies nausea, vomiting or muscle weakness.  Insulin Resistance Brooke [name] has a diagnosis of insulin resistance based on her elevated fasting insulin level >5. Insulin level of 7.3 and Hgb A1c of 5.6. Although Brooke [name]'s blood glucose readings are still under good control, insulin resistance puts her at greater risk of metabolic syndrome and diabetes. She is not taking metformin currently and continues to work on diet and exercise to decrease risk of diabetes.  ASSESSMENT AND PLAN:  Other hyperlipidemia  Vitamin D deficiency  Insulin resistance  Class 3 severe obesity with serious comorbidity and body mass index (BMI) of 50.0 to 59.9 in adult, unspecified obesity type (Brooke [name])  PLAN:  Hyperlipidemia Brooke [name] was informed of the  American Heart Association Guidelines emphasizing intensive lifestyle modifications as the first line treatment for hyperlipidemia. We discussed many lifestyle modifications today in depth, and Brooke [name] will continue to work on decreasing saturated fats such as fatty red meat, butter and many fried foods. She will continue her Category 2 plan, and will also increase vegetables and lean protein in her diet and continue to work on exercise and weight loss efforts. We will retest labs in 3 months. Brooke [name] agrees to follow up with our clinic in 2 weeks.  Vitamin D Deficiency Brooke [name] was informed that low vitamin D levels contributes to fatigue and are associated with obesity, breast, and colon cancer. Brooke [name] agrees to continue taking Vit D 5,000 IU daily and will follow up for routine testing of vitamin D, at least 2-3 times per year. She was informed of the risk of over-replacement of vitamin D and agrees to not increase her dose unless she discusses this with [place] first. Brooke [name] agrees to follow up with our clinic in 2 weeks.  Insulin Resistance Brooke [name] will continue to work on weight loss, exercise, and decreasing simple carbohydrates in her diet to help decrease the risk of diabetes. We dicussed metformin including benefits and risks. She was informed that eating too many simple carbohydrates or too many calories at one sitting increases the likelihood of GI side effects. Brooke [name] declined metformin for now and prescription was not written today. We will retest Hgb A1c and insulin in 3 months. Brooke [name] agrees to follow up with our clinic in 2 weeks  as directed to monitor her progress.  I spent > than 50% of the 25 minute visit on counseling as documented in the note.  Obesity Brooke [name] is currently in the action stage of change. As such, her goal is to continue with weight loss efforts She has agreed to follow the Category 2 plan + 100 calories  Brooke [name] has been instructed to work up to a  goal of 150 minutes of combined cardio and strengthening exercise per week for weight loss and overall health benefits. We discussed the following Behavioral Modification Strategies today: increasing lean protein intake, increasing vegetables, work on meal planning and easy cooking plans, and planning for success   Brooke [name] has agreed to follow up with our clinic in 2 weeks. She was informed of the importance of frequent follow up visits to maximize her success with intensive lifestyle modifications for her multiple health conditions.  ALLERGIES: No Known Allergies  MEDICATIONS: Current Outpatient Medications on File Prior to Visit  Medication Sig Dispense Refill  . calcium carbonate (OSCAL) 1500 (600 Ca) MG TABS tablet Take 1 tablet by mouth daily.    . hydrochlorothiazide (MICROZIDE) 12.5 MG capsule Take 1 capsule by mouth daily.    Marland Kitchen lisdexamfetamine (VYVANSE) 40 MG capsule Take 1 capsule (40 mg total) by mouth every morning. 30 capsule 0  . Magnesium Glycinate POWD 400 mg by Does not apply route daily. 100 g 0  . Melatonin 5 MG TABS Take 10 mg by mouth at bedtime.     . Multiple Vitamin (MULTIVITAMIN) tablet Take 1 tablet by mouth daily.    . nebivolol (BYSTOLIC) 5 MG tablet Take 5 mg by mouth daily.    . sertraline (ZOLOFT) 50 MG tablet Take 1 tablet (50 mg total) by mouth daily. 30 tablet 2  . valACYclovir (VALTREX) 500 MG tablet Take 500 mg by mouth daily as needed.    Marland Kitchen VITAMIN D, CHOLECALCIFEROL, PO Take 5,000 Units by mouth daily.     No current facility-administered medications on file prior to visit.     PAST MEDICAL HISTORY: Past Medical History:  Diagnosis Date  . ADD (attention deficit disorder)   . Allergy    allergic rhinitis  . Anemia    nos  . Anxiety   . Arthritis    right ankle  . Asthma    as a child  . Back pain   . Constipation   . Depression   . Elevated blood pressure reading without diagnosis of hypertension   . Genital herpes   . Lactose  intolerance   . Migraine   . Prediabetes   . Sleep apnea   . Stress   . Swelling     PAST SURGICAL HISTORY: Past Surgical History:  Procedure Laterality Date  . ANKLE SURGERY      SOCIAL HISTORY: Social History   Tobacco Use  . Smoking status: Never Smoker  . Smokeless tobacco: Never Used  Substance Use Topics  . Alcohol use: No  . Drug use: No    FAMILY HISTORY: Family History  Problem Relation Age of Onset  . Hypertension Mother   . Diabetes Mother   . Hyperlipidemia Mother   . Hypertension Father   . Sleep apnea Father   . Alcoholism Father   . Obesity Father   . Hypertension Other   . Diabetes Maternal Aunt   . Diabetes Maternal Uncle   . Heart failure Paternal Aunt   . Diabetes Paternal Aunt   . Heart failure Maternal  Grandmother   . Heart failure Maternal Grandfather     ROS: Review of Systems  Constitutional: Positive for malaise/fatigue and weight loss.  Cardiovascular: Negative for chest pain and claudication.  Gastrointestinal: Negative for nausea and vomiting.  Musculoskeletal: Negative for myalgias.       Negative muscle weakness    PHYSICAL EXAM: Blood pressure 131/86, pulse 70, temperature 98.2 F (36.8 C), temperature source Oral, height 5\' 2"  (1.575 m), weight 273 lb (123.8 kg), SpO2 97 %. Body mass index is 49.93 kg/m. Physical Exam Vitals signs reviewed.  Constitutional:      Appearance: Normal appearance. She is obese.  Cardiovascular:     Rate and Rhythm: Normal rate.     Pulses: Normal pulses.  Pulmonary:     Effort: Pulmonary effort is normal.     Breath sounds: Normal breath sounds.  Musculoskeletal: Normal range of motion.  Skin:    General: Skin is warm and dry.  Neurological:     Mental Status: She is alert and oriented to person, place, and time.  Psychiatric:        Mood and Affect: Mood normal.        Behavior: Behavior normal.     RECENT LABS AND TESTS: BMET    Component Value Date/Time   NA 138  07/17/2018 1313   K 4.2 07/17/2018 1313   CL 99 07/17/2018 1313   CO2 26 07/17/2018 1313   GLUCOSE 85 07/17/2018 1313   GLUCOSE 74 04/03/2016 1017   BUN 10 07/17/2018 1313   CREATININE 0.70 07/17/2018 1313   CREATININE 0.74 04/03/2016 1017   CALCIUM 9.0 07/17/2018 1313   GFRNONAA 104 07/17/2018 1313   GFRAA 119 07/17/2018 1313   Lab Results  Component Value Date   HGBA1C 5.6 07/17/2018   HGBA1C 5.6 04/26/2018   HGBA1C 6.0 (H) 02/08/2011   HGBA1C 5.5 10/25/2009   HGBA1C 5.8 01/03/2008   Lab Results  Component Value Date   INSULIN 7.3 07/17/2018   CBC    Component Value Date/Time   WBC 6.6 07/17/2018 1313   WBC 7.4 06/30/2011 1631   RBC 4.74 07/17/2018 1313   RBC 4.79 06/30/2011 1631   HGB 11.9 07/17/2018 1313   HCT 37.7 07/17/2018 1313   PLT 360 06/30/2011 1631   MCV 80 07/17/2018 1313   MCH 25.1 (L) 07/17/2018 1313   MCH 24.0 (L) 06/30/2011 1631   MCHC 31.6 07/17/2018 1313   MCHC 31.3 06/30/2011 1631   RDW 15.1 07/17/2018 1313   LYMPHSABS 2.0 07/17/2018 1313   MONOABS 0.5 06/30/2011 1631   EOSABS 0.1 07/17/2018 1313   BASOSABS 0.1 07/17/2018 1313   Iron/TIBC/Ferritin/ %Sat    Component Value Date/Time   IRON 41 (L) 12/17/2012 1440   FERRITIN 22.8 12/17/2012 1440   IRONPCTSAT 11.2 (L) 12/17/2012 1440   Lipid Panel     Component Value Date/Time   CHOL 181 07/17/2018 1313   TRIG 44 07/17/2018 1313   HDL 59 07/17/2018 1313   CHOLHDL 3.3 02/08/2011 1035   VLDL 8 02/08/2011 1035   LDLCALC 113 (H) 07/17/2018 1313   Hepatic Function Panel     Component Value Date/Time   PROT 7.0 07/17/2018 1313   ALBUMIN 3.9 07/17/2018 1313   AST 16 07/17/2018 1313   ALT 14 07/17/2018 1313   ALKPHOS 90 07/17/2018 1313   BILITOT 0.2 07/17/2018 1313   BILIDIR 0.1 02/08/2011 1035   IBILI 0.3 02/08/2011 1035      Component Value Date/Time  TSH 0.935 07/17/2018 1313   TSH 1.30 04/03/2016 1017   TSH 1.023 06/30/2011 1631      OBESITY BEHAVIORAL INTERVENTION  VISIT  Today's visit was # 2   Starting weight: 281 lbs Starting date: 07/17/2018 Today's weight : 273 lbs  Today's date: 07/31/2018 Total lbs lost to date: 8    ASK: We discussed the diagnosis of obesity with Pricilla Loveless today and Grey agreed to give [place] permission to discuss obesity behavioral modification therapy today.  ASSESS: Shanetra has the diagnosis of obesity and her BMI today is 49.92 Anniah is in the action stage of change   ADVISE: Bre was educated on the multiple health risks of obesity as well as the benefit of weight loss to improve her health. She was advised of the need for long term treatment and the importance of lifestyle modifications to improve her current health and to decrease her risk of future health problems.  AGREE: Multiple dietary modification options and treatment options were discussed and  Sarinity agreed to follow the recommendations documented in the above note.  ARRANGE: Wanita was educated on the importance of frequent visits to treat obesity as outlined per CMS and USPSTF guidelines and agreed to schedule her next follow up appointment today.  I, Trixie Dredge, am acting as transcriptionist for Ilene Qua, MD    I have reviewed the above documentation for accuracy and completeness, and I agree with the above. - Ilene Qua, MD

## 2018-08-05 ENCOUNTER — Ambulatory Visit: Payer: BC Managed Care – PPO | Admitting: Neurology

## 2018-08-10 ENCOUNTER — Encounter: Payer: Self-pay | Admitting: Internal Medicine

## 2018-08-12 ENCOUNTER — Other Ambulatory Visit: Payer: Self-pay | Admitting: Internal Medicine

## 2018-08-12 MED ORDER — LISDEXAMFETAMINE DIMESYLATE 40 MG PO CAPS: 40.0000 mg | ORAL_CAPSULE | ORAL | 0 refills | Status: DC

## 2018-08-14 ENCOUNTER — Encounter: Payer: Self-pay | Admitting: Internal Medicine

## 2018-08-14 ENCOUNTER — Other Ambulatory Visit: Payer: Self-pay

## 2018-08-14 ENCOUNTER — Ambulatory Visit: Payer: BC Managed Care – PPO | Admitting: Internal Medicine

## 2018-08-14 VITALS — BP 132/96 | HR 77 | Temp 98.7°F | Ht 62.8 in | Wt 278.4 lb

## 2018-08-14 DIAGNOSIS — F331 Major depressive disorder, recurrent, moderate: Secondary | ICD-10-CM | POA: Diagnosis not present

## 2018-08-14 DIAGNOSIS — I10 Essential (primary) hypertension: Secondary | ICD-10-CM | POA: Diagnosis not present

## 2018-08-15 ENCOUNTER — Ambulatory Visit (INDEPENDENT_AMBULATORY_CARE_PROVIDER_SITE_OTHER): Payer: BC Managed Care – PPO | Admitting: Family Medicine

## 2018-08-15 ENCOUNTER — Encounter (INDEPENDENT_AMBULATORY_CARE_PROVIDER_SITE_OTHER): Payer: Self-pay | Admitting: Family Medicine

## 2018-08-15 VITALS — BP 138/86 | HR 73 | Temp 98.1°F | Ht 62.0 in | Wt 271.0 lb

## 2018-08-15 DIAGNOSIS — I10 Essential (primary) hypertension: Secondary | ICD-10-CM

## 2018-08-15 DIAGNOSIS — Z6841 Body Mass Index (BMI) 40.0 and over, adult: Secondary | ICD-10-CM

## 2018-08-15 DIAGNOSIS — F3289 Other specified depressive episodes: Secondary | ICD-10-CM

## 2018-08-18 ENCOUNTER — Encounter: Payer: Self-pay | Admitting: Internal Medicine

## 2018-08-18 NOTE — Progress Notes (Signed)
Office: [phone]  /  Fax: [phone]   HPI:   Chief Complaint: OBESITY Brooke [name] is here to discuss her progress with her obesity treatment plan. She is on the Category 2 plan + 100 calories and is following her eating plan approximately 5 % of the time. She states she is exercising 0 minutes 0 times per week. Brooke [name] has had a really rough week at work last week. She was not eating secondary to stress and being busy. She plans to get back on track and will be going to the store.  Her weight is 271 lb (122.9 kg) today and has had a weight loss of 2 pounds over a period of 2 weeks since her last visit. She has lost 10 lbs since starting treatment with [place].  Hypertension Brooke [name] is a 48 y.o. female with hypertension. Brooke [name]'s blood pressure is controlled. She denies chest pain, chest pressure, or headaches. She is working on weight loss to help control her blood pressure with the goal of decreasing her risk of heart attack and stroke.   Depression with emotional eating behaviors Brooke [name] is struggling with emotional eating and using food for comfort to the extent that it is negatively impacting her health. She often snacks when she is not hungry. Brooke [name] sometimes feels she is out of control and then feels guilty that she made poor food choices. She has been working on behavior modification techniques to help reduce her emotional eating and has been somewhat successful. Her symptoms are better controlled and she shows no sign of suicidal or homicidal ideations.  Depression screen [facility] 2/9 08/14/2018 07/17/2018 06/27/2018 06/27/2018 05/30/2018  Decreased Interest 0 1 2 0 0  Down, Depressed, Hopeless 0 1 1 0 0  PHQ - 2 Score 0 2 3 0 0  Altered sleeping - 3 - - -  Tired, decreased energy - 3 2 - -  Change in appetite - 3 3 - -  Feeling bad or failure about yourself  - 2 3 - -  Trouble concentrating - 1 3 - -  Moving slowly or fidgety/restless - 0 2 - -  Suicidal thoughts - 0  0 - -  PHQ-9 Score - 14 - - -  Difficult doing work/chores - Not difficult at all Somewhat difficult - -    ASSESSMENT AND PLAN:  Essential hypertension  Other depression - with emotional eating  Class 3 severe obesity with serious comorbidity and body mass index (BMI) of 45.0 to 49.9 in adult, unspecified obesity type (Brooke [name])  PLAN:  Hypertension We discussed sodium restriction, working on healthy weight loss, and a regular exercise program as the means to achieve improved blood pressure control. Brooke [name] agreed with this plan and agreed to follow up as directed. We will continue to monitor her blood pressure as well as her progress with the above lifestyle modifications. Brooke [name] will continue her current medications and will watch for signs of hypotension as she continues her lifestyle modifications. Brooke [name] agrees to follow up with our clinic in 2 weeks.  Depression with Emotional Eating Behaviors We discussed behavior modification techniques today to help Brooke [name] deal with her emotional eating and depression. Brooke [name] agrees to continue taking Zoloft and she agrees to follow up with our clinic in 2 weeks.  I spent > than 50% of the 15 minute visit on counseling as documented in the note.  Obesity Brooke [name] is currently in the action stage of change. As such, her goal is to continue with weight loss  efforts She has agreed to follow the Category 2 plan + 100 calories Brooke [name] has been instructed to work up to a goal of 150 minutes of combined cardio and strengthening exercise per week for weight loss and overall health benefits. We discussed the following Behavioral Modification Strategies today: increasing lean protein intake, increasing vegetables and work on meal planning and easy cooking plans, and planning for success   Brooke [name] has agreed to follow up with our clinic in 2 weeks. She was informed of the importance of frequent follow up visits to maximize her success with  intensive lifestyle modifications for her multiple health conditions.  ALLERGIES: No Known Allergies  MEDICATIONS: Current Outpatient Medications on File Prior to Visit  Medication Sig Dispense Refill  . calcium carbonate (OSCAL) 1500 (600 Ca) MG TABS tablet Take 1 tablet by mouth daily.    . hydrochlorothiazide (MICROZIDE) 12.5 MG capsule Take 1 capsule by mouth daily.    Marland Kitchen lisdexamfetamine (VYVANSE) 40 MG capsule Take 1 capsule (40 mg total) by mouth every morning. 30 capsule 0  . Magnesium Glycinate POWD 400 mg by Does not apply route daily. 100 g 0  . Melatonin 5 MG TABS Take 10 mg by mouth at bedtime.     . Multiple Vitamin (MULTIVITAMIN) tablet Take 1 tablet by mouth daily.    . nebivolol (BYSTOLIC) 5 MG tablet Take 5 mg by mouth daily.    . sertraline (ZOLOFT) 50 MG tablet Take 1 tablet (50 mg total) by mouth daily. 30 tablet 2  . valACYclovir (VALTREX) 500 MG tablet Take 500 mg by mouth daily as needed.    Marland Kitchen VITAMIN D, CHOLECALCIFEROL, PO Take 5,000 Units by mouth daily.     No current facility-administered medications on file prior to visit.     PAST MEDICAL HISTORY: Past Medical History:  Diagnosis Date  . ADD (attention deficit disorder)   . Allergy    allergic rhinitis  . Anemia    nos  . Anxiety   . Arthritis    right ankle  . Asthma    as a child  . Back pain   . Constipation   . Depression   . Elevated blood pressure reading without diagnosis of hypertension   . Genital herpes   . Lactose intolerance   . Migraine   . Prediabetes   . Sleep apnea   . Stress   . Swelling     PAST SURGICAL HISTORY: Past Surgical History:  Procedure Laterality Date  . ANKLE SURGERY      SOCIAL HISTORY: Social History   Tobacco Use  . Smoking status: Never Smoker  . Smokeless tobacco: Never Used  Substance Use Topics  . Alcohol use: No  . Drug use: No    FAMILY HISTORY: Family History  Problem Relation Age of Onset  . Hypertension Mother   . Diabetes  Mother   . Hyperlipidemia Mother   . Hypertension Father   . Sleep apnea Father   . Alcoholism Father   . Obesity Father   . Hypertension Other   . Diabetes Maternal Aunt   . Diabetes Maternal Uncle   . Heart failure Paternal Aunt   . Diabetes Paternal Aunt   . Heart failure Maternal Grandmother   . Heart failure Maternal Grandfather     ROS: Review of Systems  Constitutional: Positive for weight loss.  Cardiovascular: Negative for chest pain.       Negative chest pressure  Neurological: Negative for headaches.  Psychiatric/Behavioral: Positive  for depression. Negative for suicidal ideas.    PHYSICAL EXAM: Blood pressure 138/86, pulse 73, temperature 98.1 F (36.7 C), temperature source Oral, height 5\' 2"  (1.575 m), weight 271 lb (122.9 kg), last menstrual period 07/19/2018, SpO2 95 %. Body mass index is 49.57 kg/m. Physical Exam Vitals signs reviewed.  Constitutional:      Appearance: Normal appearance. She is obese.  Cardiovascular:     Rate and Rhythm: Normal rate.     Pulses: Normal pulses.  Pulmonary:     Effort: Pulmonary effort is normal.     Breath sounds: Normal breath sounds.  Musculoskeletal: Normal range of motion.  Skin:    General: Skin is warm and dry.  Neurological:     Mental Status: She is alert and oriented to person, place, and time.  Psychiatric:        Mood and Affect: Mood normal.        Behavior: Behavior normal.     RECENT LABS AND TESTS: BMET    Component Value Date/Time   NA 138 07/17/2018 1313   K 4.2 07/17/2018 1313   CL 99 07/17/2018 1313   CO2 26 07/17/2018 1313   GLUCOSE 85 07/17/2018 1313   GLUCOSE 74 04/03/2016 1017   BUN 10 07/17/2018 1313   CREATININE 0.70 07/17/2018 1313   CREATININE 0.74 04/03/2016 1017   CALCIUM 9.0 07/17/2018 1313   GFRNONAA 104 07/17/2018 1313   GFRAA 119 07/17/2018 1313   Lab Results  Component Value Date   HGBA1C 5.6 07/17/2018   HGBA1C 5.6 04/26/2018   HGBA1C 6.0 (H) 02/08/2011    HGBA1C 5.5 10/25/2009   HGBA1C 5.8 01/03/2008   Lab Results  Component Value Date   INSULIN 7.3 07/17/2018   CBC    Component Value Date/Time   WBC 6.6 07/17/2018 1313   WBC 7.4 06/30/2011 1631   RBC 4.74 07/17/2018 1313   RBC 4.79 06/30/2011 1631   HGB 11.9 07/17/2018 1313   HCT 37.7 07/17/2018 1313   PLT 360 06/30/2011 1631   MCV 80 07/17/2018 1313   MCH 25.1 (L) 07/17/2018 1313   MCH 24.0 (L) 06/30/2011 1631   MCHC 31.6 07/17/2018 1313   MCHC 31.3 06/30/2011 1631   RDW 15.1 07/17/2018 1313   LYMPHSABS 2.0 07/17/2018 1313   MONOABS 0.5 06/30/2011 1631   EOSABS 0.1 07/17/2018 1313   BASOSABS 0.1 07/17/2018 1313   Iron/TIBC/Ferritin/ %Sat    Component Value Date/Time   IRON 41 (L) 12/17/2012 1440   FERRITIN 22.8 12/17/2012 1440   IRONPCTSAT 11.2 (L) 12/17/2012 1440   Lipid Panel     Component Value Date/Time   CHOL 181 07/17/2018 1313   TRIG 44 07/17/2018 1313   HDL 59 07/17/2018 1313   CHOLHDL 3.3 02/08/2011 1035   VLDL 8 02/08/2011 1035   LDLCALC 113 (H) 07/17/2018 1313   Hepatic Function Panel     Component Value Date/Time   PROT 7.0 07/17/2018 1313   ALBUMIN 3.9 07/17/2018 1313   AST 16 07/17/2018 1313   ALT 14 07/17/2018 1313   ALKPHOS 90 07/17/2018 1313   BILITOT 0.2 07/17/2018 1313   BILIDIR 0.1 02/08/2011 1035   IBILI 0.3 02/08/2011 1035      Component Value Date/Time   TSH 0.935 07/17/2018 1313   TSH 1.30 04/03/2016 1017   TSH 1.023 06/30/2011 1631      OBESITY BEHAVIORAL INTERVENTION VISIT  Today's visit was # 3   Starting weight: 281 lbs Starting date: 07/17/2018 Today's weight :  271 lbs Today's date: 08/15/2018 Total lbs lost to date: 10    ASK: We discussed the diagnosis of obesity with Brooke [name] today and Brooke [name] agreed to give [place] permission to discuss obesity behavioral modification therapy today.  ASSESS: Brooke [name] has the diagnosis of obesity and her BMI today is 49.55 Brooke [name] is in the action stage of  change   ADVISE: Brooke [name] was educated on the multiple health risks of obesity as well as the benefit of weight loss to improve her health. She was advised of the need for long term treatment and the importance of lifestyle modifications to improve her current health and to decrease her risk of future health problems.  AGREE: Multiple dietary modification options and treatment options were discussed and  Brooke [name] agreed to follow the recommendations documented in the above note.  ARRANGE: Brooke [name] was educated on the importance of frequent visits to treat obesity as outlined per CMS and USPSTF guidelines and agreed to schedule her next follow up appointment today.  I, Brooke [name], am acting as transcriptionist for Ilene Qua, MD  I have reviewed the above documentation for accuracy and completeness, and I agree with the above. - Ilene Qua, MD
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
## 2018-08-29 ENCOUNTER — Other Ambulatory Visit: Payer: Self-pay | Admitting: Internal Medicine

## 2018-08-29 DIAGNOSIS — Z1231 Encounter for screening mammogram for malignant neoplasm of breast: Secondary | ICD-10-CM

## 2018-09-02 ENCOUNTER — Encounter (INDEPENDENT_AMBULATORY_CARE_PROVIDER_SITE_OTHER): Payer: Self-pay | Admitting: Family Medicine

## 2018-09-02 ENCOUNTER — Ambulatory Visit (INDEPENDENT_AMBULATORY_CARE_PROVIDER_SITE_OTHER): Payer: BC Managed Care – PPO | Admitting: Family Medicine

## 2018-09-02 VITALS — BP 116/76 | HR 65 | Temp 98.2°F | Ht 62.0 in | Wt 269.0 lb

## 2018-09-02 DIAGNOSIS — Z6841 Body Mass Index (BMI) 40.0 and over, adult: Secondary | ICD-10-CM

## 2018-09-02 DIAGNOSIS — E66813 Obesity, class 3: Secondary | ICD-10-CM

## 2018-09-02 DIAGNOSIS — E559 Vitamin D deficiency, unspecified: Secondary | ICD-10-CM

## 2018-09-02 DIAGNOSIS — I10 Essential (primary) hypertension: Secondary | ICD-10-CM

## 2018-09-02 NOTE — Progress Notes (Signed)
Office: [phone]  /  Fax: [phone]   HPI:   Chief Complaint: OBESITY Brooke [name] is here to discuss her progress with her obesity treatment plan. She is on the Category 2 plan + 100 calories and is following her eating plan approximately 60 % of the time. She states she is exercising 0 minutes 0 times per week. Brooke [name] is working on sticking to the plan as best as she can secondary to her schedule at work being so busy. She doesn't always eat at lunch, probably eating lunch about 1 time per week.  Her weight is 269 lb (122 kg) today and has had a weight loss of 2 pounds over a period of 2 to 3 weeks since her last visit. She has lost 12 lbs since starting treatment with [place].  Hypertension Brooke [name] is a 48 y.o. female with hypertension. Brooke [name]'s blood pressure is controlled today. She denies chest pain, chest pressure, or headaches. She is working on weight loss to help control her blood pressure with the goal of decreasing her risk of heart attack and stroke.   Vitamin D Deficiency Brooke [name] has a diagnosis of vitamin D deficiency. She is currently taking prescription Vit D. She notes fatigue and denies nausea, vomiting or muscle weakness.  ASSESSMENT AND PLAN:  Essential hypertension  Vitamin D deficiency  Class 3 severe obesity with serious comorbidity and body mass index (BMI) of 45.0 to 49.9 in adult, unspecified obesity type ([place])  PLAN:  Hypertension We discussed sodium restriction, working on healthy weight loss, and a regular exercise program as the means to achieve improved blood pressure control. Brooke [name] agreed with this plan and agreed to follow up as directed. We will continue to monitor her blood pressure as well as her progress with the above lifestyle modifications. Brooke [name] agrees to continue her current medications and will watch for signs of hypotension as she continues her lifestyle modifications. Brooke [name] agrees to follow up with our clinic  in 2 weeks.  Vitamin D Deficiency Brooke [name] was informed that low vitamin D levels contributes to fatigue and are associated with obesity, breast, and colon cancer. Brooke [name] agrees to continue taking prescription Vit D @50 ,000 IU every week, no refill needed. She will follow up for routine testing of vitamin D, at least 2-3 times per year. She was informed of the risk of over-replacement of vitamin D and agrees to not increase her dose unless she discusses this with [place] first. Brooke [name] agrees to follow up with our clinic in 2 weeks.  I spent > than 50% of the 15 minute visit on counseling as documented in the note.  Obesity Brooke [name] is currently in the action stage of change. As such, her goal is to continue with weight loss efforts She has agreed to keep a food journal with 300-400 calories and 25+ grams of protein at lunch daily and follow the Category 2 plan + 100 calories Brooke [name] has been instructed to work up to a goal of 150 minutes of combined cardio and strengthening exercise per week for weight loss and overall health benefits. We discussed the following Behavioral Modification Strategies today: increasing lean protein intake, increasing vegetables, work on meal planning and easy cooking plans, better snacking choices, and planning for success   Brooke [name] has agreed to follow up with our clinic in 2 weeks. She was informed of the importance of frequent follow up visits to maximize her success with intensive lifestyle modifications for her multiple health conditions.  ALLERGIES: No Known Allergies  MEDICATIONS: Current Outpatient Medications on File Prior to Visit  Medication Sig Dispense Refill  . calcium carbonate (OSCAL) 1500 (600 Ca) MG TABS tablet Take 1 tablet by mouth daily.    . hydrochlorothiazide (MICROZIDE) 12.5 MG capsule Take 1 capsule by mouth daily.    Marland Kitchen lisdexamfetamine (VYVANSE) 40 MG capsule Take 1 capsule (40 mg total) by mouth every morning. 30 capsule 0  .  Magnesium Glycinate POWD 400 mg by Does not apply route daily. 100 g 0  . Melatonin 5 MG TABS Take 10 mg by mouth at bedtime.     . Multiple Vitamin (MULTIVITAMIN) tablet Take 1 tablet by mouth daily.    . nebivolol (BYSTOLIC) 5 MG tablet Take 5 mg by mouth daily.    . sertraline (ZOLOFT) 50 MG tablet Take 1 tablet (50 mg total) by mouth daily. 30 tablet 2  . valACYclovir (VALTREX) 500 MG tablet Take 500 mg by mouth daily as needed.    Marland Kitchen VITAMIN D, CHOLECALCIFEROL, PO Take 5,000 Units by mouth daily.     No current facility-administered medications on file prior to visit.     PAST MEDICAL HISTORY: Past Medical History:  Diagnosis Date  . ADD (attention deficit disorder)   . Allergy    allergic rhinitis  . Anemia    nos  . Anxiety   . Arthritis    right ankle  . Asthma    as a child  . Back pain   . Constipation   . Depression   . Elevated blood pressure reading without diagnosis of hypertension   . Genital herpes   . Lactose intolerance   . Migraine   . Prediabetes   . Sleep apnea   . Stress   . Swelling     PAST SURGICAL HISTORY: Past Surgical History:  Procedure Laterality Date  . ANKLE SURGERY      SOCIAL HISTORY: Social History   Tobacco Use  . Smoking status: Never Smoker  . Smokeless tobacco: Never Used  Substance Use Topics  . Alcohol use: No  . Drug use: No    FAMILY HISTORY: Family History  Problem Relation Age of Onset  . Hypertension Mother   . Diabetes Mother   . Hyperlipidemia Mother   . Hypertension Father   . Sleep apnea Father   . Alcoholism Father   . Obesity Father   . Hypertension Other   . Diabetes Maternal Aunt   . Diabetes Maternal Uncle   . Heart failure Paternal Aunt   . Diabetes Paternal Aunt   . Heart failure Maternal Grandmother   . Heart failure Maternal Grandfather     ROS: Review of Systems  Constitutional: Positive for malaise/fatigue and weight loss.  Cardiovascular: Negative for chest pain.       Negative  chest pressure  Gastrointestinal: Negative for nausea and vomiting.  Musculoskeletal:       Negative muscle weakness  Neurological: Negative for headaches.    PHYSICAL EXAM: Blood pressure 116/76, pulse 65, temperature 98.2 F (36.8 C), temperature source Oral, height 5\' 2"  (1.575 m), weight 269 lb (122 kg), SpO2 98 %. Body mass index is 49.2 kg/m. Physical Exam Vitals signs reviewed.  Constitutional:      Appearance: Normal appearance. She is obese.  Cardiovascular:     Rate and Rhythm: Normal rate.     Pulses: Normal pulses.  Pulmonary:     Effort: Pulmonary effort is normal.     Breath sounds: Normal breath sounds.  Musculoskeletal:  Normal range of motion.  Skin:    General: Skin is warm and dry.  Neurological:     Mental Status: She is alert and oriented to person, place, and time.  Psychiatric:        Mood and Affect: Mood normal.        Behavior: Behavior normal.     RECENT LABS AND TESTS: BMET    Component Value Date/Time   NA 138 07/17/2018 1313   K 4.2 07/17/2018 1313   CL 99 07/17/2018 1313   CO2 26 07/17/2018 1313   GLUCOSE 85 07/17/2018 1313   GLUCOSE 74 04/03/2016 1017   BUN 10 07/17/2018 1313   CREATININE 0.70 07/17/2018 1313   CREATININE 0.74 04/03/2016 1017   CALCIUM 9.0 07/17/2018 1313   GFRNONAA 104 07/17/2018 1313   GFRAA 119 07/17/2018 1313   Lab Results  Component Value Date   HGBA1C 5.6 07/17/2018   HGBA1C 5.6 04/26/2018   HGBA1C 6.0 (H) 02/08/2011   HGBA1C 5.5 10/25/2009   HGBA1C 5.8 01/03/2008   Lab Results  Component Value Date   INSULIN 7.3 07/17/2018   CBC    Component Value Date/Time   WBC 6.6 07/17/2018 1313   WBC 7.4 06/30/2011 1631   RBC 4.74 07/17/2018 1313   RBC 4.79 06/30/2011 1631   HGB 11.9 07/17/2018 1313   HCT 37.7 07/17/2018 1313   PLT 360 06/30/2011 1631   MCV 80 07/17/2018 1313   MCH 25.1 (L) 07/17/2018 1313   MCH 24.0 (L) 06/30/2011 1631   MCHC 31.6 07/17/2018 1313   MCHC 31.3 06/30/2011 1631   RDW  15.1 07/17/2018 1313   LYMPHSABS 2.0 07/17/2018 1313   MONOABS 0.5 06/30/2011 1631   EOSABS 0.1 07/17/2018 1313   BASOSABS 0.1 07/17/2018 1313   Iron/TIBC/Ferritin/ %Sat    Component Value Date/Time   IRON 41 (L) 12/17/2012 1440   FERRITIN 22.8 12/17/2012 1440   IRONPCTSAT 11.2 (L) 12/17/2012 1440   Lipid Panel     Component Value Date/Time   CHOL 181 07/17/2018 1313   TRIG 44 07/17/2018 1313   HDL 59 07/17/2018 1313   CHOLHDL 3.3 02/08/2011 1035   VLDL 8 02/08/2011 1035   LDLCALC 113 (H) 07/17/2018 1313   Hepatic Function Panel     Component Value Date/Time   PROT 7.0 07/17/2018 1313   ALBUMIN 3.9 07/17/2018 1313   AST 16 07/17/2018 1313   ALT 14 07/17/2018 1313   ALKPHOS 90 07/17/2018 1313   BILITOT 0.2 07/17/2018 1313   BILIDIR 0.1 02/08/2011 1035   IBILI 0.3 02/08/2011 1035      Component Value Date/Time   TSH 0.935 07/17/2018 1313   TSH 1.30 04/03/2016 1017   TSH 1.023 06/30/2011 1631      OBESITY BEHAVIORAL INTERVENTION VISIT  Today's visit was # 4   Starting weight: 281 lbs Starting date: 07/17/2018 Today's weight : 269 lbs  Today's date: 09/02/2018 Total lbs lost to date: 12    09/02/2018  Height 5\' 2"  (1.575 m)  Weight 269 lb (122 kg)  BMI (Calculated) 49.19  BLOOD PRESSURE - SYSTOLIC 341  BLOOD PRESSURE - DIASTOLIC 76   Body Fat % 93.7 %  Total Body Water (lbs) 93 lbs     ASK: We discussed the diagnosis of obesity with Brooke [name] today and Brooke [name] agreed to give [place] permission to discuss obesity behavioral modification therapy today.  ASSESS: Sneha has the diagnosis of obesity and her BMI today is 49.19 Merilee is  in the action stage of change   ADVISE: Keshonda was educated on the multiple health risks of obesity as well as the benefit of weight loss to improve her health. She was advised of the need for long term treatment and the importance of lifestyle modifications to improve her current health and to decrease her risk  of future health problems.  AGREE: Multiple dietary modification options and treatment options were discussed and  Khamila agreed to follow the recommendations documented in the above note.  ARRANGE: Chynah was educated on the importance of frequent visits to treat obesity as outlined per CMS and USPSTF guidelines and agreed to schedule her next follow up appointment today.  I, Trixie Dredge, am acting as transcriptionist for Brooke Qua, MD  I have reviewed the above documentation for accuracy and completeness, and I agree with the above. - Brooke Qua, MD

## 2018-09-08 ENCOUNTER — Encounter: Payer: Self-pay | Admitting: Internal Medicine

## 2018-09-17 ENCOUNTER — Encounter (INDEPENDENT_AMBULATORY_CARE_PROVIDER_SITE_OTHER): Payer: Self-pay

## 2018-09-17 ENCOUNTER — Other Ambulatory Visit: Payer: Self-pay | Admitting: Internal Medicine

## 2018-09-19 ENCOUNTER — Other Ambulatory Visit: Payer: Self-pay | Admitting: Internal Medicine

## 2018-09-19 ENCOUNTER — Encounter: Payer: Self-pay | Admitting: Internal Medicine

## 2018-09-19 DIAGNOSIS — Z79899 Other long term (current) drug therapy: Secondary | ICD-10-CM

## 2018-09-20 ENCOUNTER — Other Ambulatory Visit: Payer: BC Managed Care – PPO

## 2018-09-20 ENCOUNTER — Other Ambulatory Visit: Payer: Self-pay

## 2018-09-20 DIAGNOSIS — Z79899 Other long term (current) drug therapy: Secondary | ICD-10-CM

## 2018-09-22 LAB — DRUG PROFILE, UR, 9 DRUGS (LABCORP)
Amphetamines, Urine: POSITIVE — AB
Barbiturate Quant, Ur: NEGATIVE ng/mL
Benzodiazepine Quant, Ur: NEGATIVE ng/mL
Cannabinoid Quant, Ur: NEGATIVE ng/mL
Cocaine (Metab.): NEGATIVE ng/mL
Methadone Screen, Urine: NEGATIVE ng/mL
Opiate Quant, Ur: NEGATIVE ng/mL
PCP Quant, Ur: NEGATIVE ng/mL
Propoxyphene: NEGATIVE ng/mL

## 2018-09-23 ENCOUNTER — Ambulatory Visit (INDEPENDENT_AMBULATORY_CARE_PROVIDER_SITE_OTHER): Payer: BC Managed Care – PPO | Admitting: Family Medicine

## 2018-09-25 ENCOUNTER — Encounter: Payer: BC Managed Care – PPO | Admitting: Internal Medicine

## 2018-09-27 ENCOUNTER — Telehealth: Payer: Self-pay

## 2018-09-27 NOTE — Telephone Encounter (Signed)
PA FOR SAXENDA SENT TO PLAN -[facility]OVER[facility] 

## 2018-09-30 ENCOUNTER — Other Ambulatory Visit: Payer: Self-pay

## 2018-09-30 ENCOUNTER — Other Ambulatory Visit: Payer: Self-pay | Admitting: Internal Medicine

## 2018-09-30 ENCOUNTER — Encounter: Payer: Self-pay | Admitting: Internal Medicine

## 2018-09-30 MED ORDER — LISDEXAMFETAMINE DIMESYLATE 40 MG PO CAPS: 40.0000 mg | ORAL_CAPSULE | ORAL | 0 refills | Status: DC

## 2018-10-01 ENCOUNTER — Telehealth: Payer: Self-pay | Admitting: Internal Medicine

## 2018-10-01 NOTE — Telephone Encounter (Signed)
PA STARTED [id number]

## 2018-10-06 ENCOUNTER — Encounter: Payer: Self-pay | Admitting: Internal Medicine

## 2018-10-07 ENCOUNTER — Other Ambulatory Visit: Payer: Self-pay

## 2018-10-07 MED ORDER — NEBIVOLOL HCL 5 MG PO TABS: ORAL_TABLET | ORAL | 1 refills | Status: DC

## 2018-10-08 ENCOUNTER — Telehealth: Payer: Self-pay

## 2018-10-08 NOTE — Telephone Encounter (Signed)
She stated she is starting to have gas problems and being bloated and she is not using the bathroom she stated she is taking magnesium at night and she is walking everyday for 15-20 mins. She said the pain is something she has never experienced before. The pain is sudden and always unexpected pt stated if she twists her body a certain of way the pain will come. She stated she sometime feels like she needs to burp and feels like there is something coming up a little bit afterwards she has the bloated gassiness after the pain.

## 2018-10-08 NOTE — Telephone Encounter (Signed)
I called pt to see is she picked her Vyvanse and she stated she did. Patient stated she was going to give you a call because she is having some sharp pain on the right side of her back that comes and goes she stated the pain takes her breath away when it comes and also she gets nausea she has tried taking some tylenol for the pain but that does not help. Patient wants to know what she should do? YRL,RMA

## 2018-10-08 NOTE — Telephone Encounter (Signed)
This sounds like gas. Go to drugstore and get Gas-X or Phazyme.

## 2018-10-08 NOTE — Telephone Encounter (Signed)
Possibly a muscle spasm. She should take magnesium nightly and stay well hydrated. Also, since she is home she should be exercising five days per week for at least 30 minutes. This helps to keep her limber.  Since the tylenol is not helping, she can try one-time dose of ibuprofen 600mg . (three OTC capsules). She can contact us if her sx persist.

## 2018-10-09 NOTE — Telephone Encounter (Signed)
Patient notified YRL,RMA 

## 2018-10-25 ENCOUNTER — Encounter: Payer: Self-pay | Admitting: Internal Medicine

## 2018-10-26 ENCOUNTER — Other Ambulatory Visit: Payer: Self-pay | Admitting: Internal Medicine

## 2018-10-28 ENCOUNTER — Other Ambulatory Visit: Payer: Self-pay

## 2018-10-28 MED ORDER — SERTRALINE HCL 50 MG PO TABS: 50.0000 mg | ORAL_TABLET | Freq: Every day | ORAL | 2 refills | Status: DC

## 2018-10-30 ENCOUNTER — Other Ambulatory Visit: Payer: Self-pay

## 2018-10-30 ENCOUNTER — Ambulatory Visit (INDEPENDENT_AMBULATORY_CARE_PROVIDER_SITE_OTHER): Payer: BC Managed Care – PPO | Admitting: Family Medicine

## 2018-10-30 ENCOUNTER — Encounter (INDEPENDENT_AMBULATORY_CARE_PROVIDER_SITE_OTHER): Payer: Self-pay | Admitting: Family Medicine

## 2018-10-30 DIAGNOSIS — E8881 Metabolic syndrome: Secondary | ICD-10-CM

## 2018-10-30 DIAGNOSIS — Z6841 Body Mass Index (BMI) 40.0 and over, adult: Secondary | ICD-10-CM

## 2018-10-30 DIAGNOSIS — E88819 Insulin resistance, unspecified: Secondary | ICD-10-CM

## 2018-10-30 DIAGNOSIS — E7849 Other hyperlipidemia: Secondary | ICD-10-CM | POA: Diagnosis not present

## 2018-10-30 DIAGNOSIS — E66813 Obesity, class 3: Secondary | ICD-10-CM

## 2018-10-30 NOTE — Progress Notes (Signed)
Office: [phone]  /  Fax: [phone] TeleHealth Visit:  Brooke [name] has verbally consented to this TeleHealth visit today. The patient is located at home, the provider is located at the [facility] office. The participants in this visit include the listed provider and patient. The visit was conducted today via webex.  HPI:   Chief Complaint: OBESITY Brooke [name] is here to discuss her progress with her obesity treatment plan. She is on the keep a food journal with 300-400 calories and 25+ grams of protein at lunch daily and follow the Category 2 plan + 100 calories and is following her eating plan approximately 0 % of the time. She states she is exercising 0 minutes 0 times per week. [name]'s most recent weight is of 275.6 lbs at home. She is working from home and feeling significantly less stressed with not having to go to work. She hasn't been following the meal plan per say and is often skipping meals.  We were unable to weigh the patient today for this TeleHealth visit. She feels as if she has gained 2-3 lbs since her last visit. She has lost 12 lbs since starting treatment with [place].  Hyperlipidemia [name] has hyperlipidemia and has been trying to improve her cholesterol levels with intensive lifestyle modification including a low saturated fat diet, exercise and weight loss. Last LDL was elevated previously. She is not on statin and denies any chest pain, claudication or myalgias.  Insulin Resistance [name] has a diagnosis of insulin resistance based on her elevated fasting insulin level >5. Last insulin level was of >7. Although [name]'s blood glucose readings are still under good control, insulin resistance puts her at greater risk of metabolic syndrome and diabetes. She notes carbohydrate cravings especially after dinner. She is not taking metformin currently and continues to work on diet and exercise to decrease risk of diabetes.  ASSESSMENT AND PLAN:   Other hyperlipidemia  Insulin resistance  Class 3 severe obesity with serious comorbidity and body mass index (BMI) of 45.0 to 49.9 in adult, unspecified obesity type ([place])  PLAN:  Hyperlipidemia [name] was informed of the American Heart Association Guidelines emphasizing intensive lifestyle modifications as the first line treatment for hyperlipidemia. We discussed many lifestyle modifications today in depth, and [name] will continue to work on decreasing saturated fats such as fatty red meat, butter and many fried foods. She will also increase vegetables and lean protein in her diet and continue to work on exercise and weight loss efforts. We will repeat labs at next appointment. [name] agrees to follow up with our clinic in 2 weeks.  Insulin Resistance [name] will continue to work on weight loss, exercise, and decreasing simple carbohydrates in her diet to help decrease the risk of diabetes. We dicussed metformin including benefits and risks. She was informed that eating too many simple carbohydrates or too many calories at one sitting increases the likelihood of GI side effects. [name] declined metformin for now and prescription was not written today. We will repeat labs at next appointment. [name] agrees to follow up with our clinic in 2 weeks as directed to monitor her progress.  Obesity [name] is currently in the action stage of change. As such, her goal is to continue with weight loss efforts She has agreed to keep a food journal with 1100-1200 calories and 80+ grams of protein daily or follow the Category 2 plan [name] has been instructed to work up to a goal of 150 minutes of combined cardio and  strengthening exercise per week for weight loss and overall health benefits. We discussed the following Behavioral Modification Strategies today: increasing lean protein intake, increasing vegetables and work on meal planning and easy cooking plans, no skipping meals,  and planning for success   [name] has agreed to follow up with our clinic in 2 weeks. She was informed of the importance of frequent follow up visits to maximize her success with intensive lifestyle modifications for her multiple health conditions.  ALLERGIES: No Known Allergies  MEDICATIONS: Current Outpatient Medications on File Prior to Visit  Medication Sig Dispense Refill  . calcium carbonate (OSCAL) 1500 (600 Ca) MG TABS tablet Take 1 tablet by mouth daily.    . hydrochlorothiazide (MICROZIDE) 12.5 MG capsule TAKE ONE CAPSULE BY MOUTH EVERY DAY 90 capsule 1  . lisdexamfetamine (VYVANSE) 40 MG capsule Take 1 capsule (40 mg total) by mouth every morning. 90 capsule 0  . Magnesium Glycinate POWD 400 mg by Does not apply route daily. 100 g 0  . Melatonin 5 MG TABS Take 10 mg by mouth at bedtime.     . Multiple Vitamin (MULTIVITAMIN) tablet Take 1 tablet by mouth daily.    . nebivolol (BYSTOLIC) 5 MG tablet Take 1 tablet by mouth daily 30 tablet 1  . sertraline (ZOLOFT) 50 MG tablet TAKE 1 TABLET(50 MG) BY MOUTH DAILY 30 tablet 2  . sertraline (ZOLOFT) 50 MG tablet Take 1 tablet (50 mg total) by mouth daily. 30 tablet 2  . valACYclovir (VALTREX) 500 MG tablet Take 500 mg by mouth daily as needed.    Marland Kitchen VITAMIN D, CHOLECALCIFEROL, PO Take 5,000 Units by mouth daily.     No current facility-administered medications on file prior to visit.     PAST MEDICAL HISTORY: Past Medical History:  Diagnosis Date  . ADD (attention deficit disorder)   . Allergy    allergic rhinitis  . Anemia    nos  . Anxiety   . Arthritis    right ankle  . Asthma    as a child  . Back pain   . Constipation   . Depression   . Elevated blood pressure reading without diagnosis of hypertension   . Genital herpes   . Lactose intolerance   . Migraine   . Prediabetes   . Sleep apnea   . Stress   . Swelling     PAST SURGICAL HISTORY: Past Surgical History:  Procedure Laterality Date  . ANKLE  SURGERY      SOCIAL HISTORY: Social History   Tobacco Use  . Smoking status: Never Smoker  . Smokeless tobacco: Never Used  Substance Use Topics  . Alcohol use: No  . Drug use: No    FAMILY HISTORY: Family History  Problem Relation Age of Onset  . Hypertension Mother   . Diabetes Mother   . Hyperlipidemia Mother   . Hypertension Father   . Sleep apnea Father   . Alcoholism Father   . Obesity Father   . Hypertension Other   . Diabetes Maternal Aunt   . Diabetes Maternal Uncle   . Heart failure Paternal Aunt   . Diabetes Paternal Aunt   . Heart failure Maternal Grandmother   . Heart failure Maternal Grandfather     ROS: Review of Systems  Constitutional: Negative for weight loss.  Cardiovascular: Negative for chest pain and claudication.  Musculoskeletal: Negative for myalgias.    PHYSICAL EXAM: Pt in no acute distress  RECENT LABS AND TESTS: BMET  Component Value Date/Time   NA 138 07/17/2018 1313   K 4.2 07/17/2018 1313   CL 99 07/17/2018 1313   CO2 26 07/17/2018 1313   GLUCOSE 85 07/17/2018 1313   GLUCOSE 74 04/03/2016 1017   BUN 10 07/17/2018 1313   CREATININE 0.70 07/17/2018 1313   CREATININE 0.74 04/03/2016 1017   CALCIUM 9.0 07/17/2018 1313   GFRNONAA 104 07/17/2018 1313   GFRAA 119 07/17/2018 1313   Lab Results  Component Value Date   HGBA1C 5.6 07/17/2018   HGBA1C 5.6 04/26/2018   HGBA1C 6.0 (H) 02/08/2011   HGBA1C 5.5 10/25/2009   HGBA1C 5.8 01/03/2008   Lab Results  Component Value Date   INSULIN 7.3 07/17/2018   CBC    Component Value Date/Time   WBC 6.6 07/17/2018 1313   WBC 7.4 06/30/2011 1631   RBC 4.74 07/17/2018 1313   RBC 4.79 06/30/2011 1631   HGB 11.9 07/17/2018 1313   HCT 37.7 07/17/2018 1313   PLT 360 06/30/2011 1631   MCV 80 07/17/2018 1313   MCH 25.1 (L) 07/17/2018 1313   MCH 24.0 (L) 06/30/2011 1631   MCHC 31.6 07/17/2018 1313   MCHC 31.3 06/30/2011 1631   RDW 15.1 07/17/2018 1313   LYMPHSABS 2.0  07/17/2018 1313   MONOABS 0.5 06/30/2011 1631   EOSABS 0.1 07/17/2018 1313   BASOSABS 0.1 07/17/2018 1313   Iron/TIBC/Ferritin/ %Sat    Component Value Date/Time   IRON 41 (L) 12/17/2012 1440   FERRITIN 22.8 12/17/2012 1440   IRONPCTSAT 11.2 (L) 12/17/2012 1440   Lipid Panel     Component Value Date/Time   CHOL 181 07/17/2018 1313   TRIG 44 07/17/2018 1313   HDL 59 07/17/2018 1313   CHOLHDL 3.3 02/08/2011 1035   VLDL 8 02/08/2011 1035   LDLCALC 113 (H) 07/17/2018 1313   Hepatic Function Panel     Component Value Date/Time   PROT 7.0 07/17/2018 1313   ALBUMIN 3.9 07/17/2018 1313   AST 16 07/17/2018 1313   ALT 14 07/17/2018 1313   ALKPHOS 90 07/17/2018 1313   BILITOT 0.2 07/17/2018 1313   BILIDIR 0.1 02/08/2011 1035   IBILI 0.3 02/08/2011 1035      Component Value Date/Time   TSH 0.935 07/17/2018 1313   TSH 1.30 04/03/2016 1017   TSH 1.023 06/30/2011 1631      I, Trixie Dredge, am acting as transcriptionist for Ilene Qua, MD  I have reviewed the above documentation for accuracy and completeness, and I agree with the above. - Ilene Qua, MD

## 2018-11-13 ENCOUNTER — Ambulatory Visit (INDEPENDENT_AMBULATORY_CARE_PROVIDER_SITE_OTHER): Payer: BC Managed Care – PPO | Admitting: Family Medicine

## 2018-11-26 ENCOUNTER — Other Ambulatory Visit: Payer: Self-pay

## 2018-11-26 ENCOUNTER — Encounter: Payer: Self-pay | Admitting: Internal Medicine

## 2018-11-26 MED ORDER — SERTRALINE HCL 50 MG PO TABS: 50.0000 mg | ORAL_TABLET | Freq: Every day | ORAL | 1 refills | Status: DC

## 2018-11-27 ENCOUNTER — Other Ambulatory Visit: Payer: Self-pay | Admitting: Internal Medicine

## 2018-11-27 MED ORDER — LISDEXAMFETAMINE DIMESYLATE 40 MG PO CAPS: 40.0000 mg | ORAL_CAPSULE | ORAL | 0 refills | Status: DC

## 2018-11-28 ENCOUNTER — Telehealth: Payer: Self-pay | Admitting: Internal Medicine

## 2018-11-28 NOTE — Telephone Encounter (Signed)
PA STARTED FOR VYVANSE 40MG  KEY#[id number]

## 2018-12-03 ENCOUNTER — Other Ambulatory Visit: Payer: Self-pay

## 2018-12-03 ENCOUNTER — Encounter (INDEPENDENT_AMBULATORY_CARE_PROVIDER_SITE_OTHER): Payer: Self-pay | Admitting: Family Medicine

## 2018-12-03 ENCOUNTER — Encounter: Payer: Self-pay | Admitting: Internal Medicine

## 2018-12-03 ENCOUNTER — Ambulatory Visit (INDEPENDENT_AMBULATORY_CARE_PROVIDER_SITE_OTHER): Payer: BC Managed Care – PPO | Admitting: Family Medicine

## 2018-12-03 DIAGNOSIS — Z6841 Body Mass Index (BMI) 40.0 and over, adult: Secondary | ICD-10-CM | POA: Diagnosis not present

## 2018-12-03 DIAGNOSIS — E7849 Other hyperlipidemia: Secondary | ICD-10-CM

## 2018-12-03 DIAGNOSIS — E8881 Metabolic syndrome: Secondary | ICD-10-CM

## 2018-12-03 NOTE — Progress Notes (Signed)
Office: [phone]  /  Fax: [phone] TeleHealth Visit:  Brooke [name] has verbally consented to this TeleHealth visit today. The patient is located at home, the provider is located at the [employer] [facility] office. The participants in this visit include the listed provider and patient. The visit was conducted today via webex.  HPI:   Chief Complaint: OBESITY Brooke [name] is here to discuss her progress with her obesity treatment plan. She is on the keep a food journal with 1100-1200 calories and 80+ grams of protein daily or follow the Category 2 plan and is following her eating plan approximately 0 % of the time. She states she is doing low impact exercises for 40 minutes 3 times per week. Brooke [name] has gained weight and weighed at 280 lbs last week. She has been struggling with planning and meal prepping. She is often skipping meals while at work and just grabbing food on the way home.  We were unable to weigh the patient today for this TeleHealth visit. She feels as if she has gained weight since her last visit. She has lost 12 lbs since starting treatment with [place].  Hyperlipidemia Brooke [name] has hyperlipidemia and has been trying to improve her cholesterol levels with intensive lifestyle modification including a low saturated fat diet, exercise and weight loss. Last LDL was of 113 and she is not on statin. She denies any chest pain, claudication or myalgias.  Insulin Resistance Brooke [name] has a diagnosis of insulin resistance based on her elevated fasting insulin level >5. Although Brooke [name]'s blood glucose readings are still under good control, insulin resistance puts her at greater risk of metabolic syndrome and diabetes. She notes carbohydrate cravings. She is not taking metformin currently and continues to work on diet and exercise to decrease risk of diabetes.  ASSESSMENT AND PLAN:  Other hyperlipidemia  Insulin resistance  Class 3 severe obesity with serious  comorbidity and body mass index (BMI) of 45.0 to 49.9 in adult, unspecified obesity type ([place])  PLAN:  Hyperlipidemia Brooke [name] was informed of the American Heart Association Guidelines emphasizing intensive lifestyle modifications as the first line treatment for hyperlipidemia. We discussed many lifestyle modifications today in depth, and Brooke [name] will continue to work on decreasing saturated fats such as fatty red meat, butter and many fried foods. She will also increase vegetables and lean protein in her diet and continue to work on exercise and weight loss efforts. We will repeat labs at her first in person appointment. Brooke [name] agrees to follow up with our clinic in 2 weeks.  Insulin Resistance Brooke [name] will continue to work on weight loss, exercise, and decreasing simple carbohydrates in her diet to help decrease the risk of diabetes. We dicussed metformin including benefits and risks. She was informed that eating too many simple carbohydrates or too many calories at one sitting increases the likelihood of GI side effects. Brooke [name] declined metformin for now and prescription was not written today. We will repeat labs at her first in person appointment. Brooke [name] agrees to follow up with our clinic in 2 weeks as directed to monitor her progress.  Obesity Brooke [name] is currently in the action stage of change. As such, her goal is to continue with weight loss efforts She has agreed to follow the Category 2 plan Brooke [name] has been instructed to work up to a goal of 150 minutes of combined cardio and strengthening exercise per week for weight loss and overall health benefits. We discussed the following Behavioral Modification Strategies today: increasing lean protein intake,  increasing vegetables and work on meal planning and easy cooking plans, keeping healthy foods in the home, better snacking choices, and planning for success   Brooke [name] has agreed to follow up with our clinic in 2 weeks.  She was informed of the importance of frequent follow up visits to maximize her success with intensive lifestyle modifications for her multiple health conditions.  ALLERGIES: No Known Allergies  MEDICATIONS: Current Outpatient Medications on File Prior to Visit  Medication Sig Dispense Refill  . calcium carbonate (OSCAL) 1500 (600 Ca) MG TABS tablet Take 1 tablet by mouth daily.    . hydrochlorothiazide (MICROZIDE) 12.5 MG capsule TAKE ONE CAPSULE BY MOUTH EVERY DAY 90 capsule 1  . lisdexamfetamine (VYVANSE) 40 MG capsule Take 1 capsule (40 mg total) by mouth every morning. 90 capsule 0  . Magnesium Glycinate POWD 400 mg by Does not apply route daily. 100 g 0  . Melatonin 5 MG TABS Take 10 mg by mouth at bedtime.     . Multiple Vitamin (MULTIVITAMIN) tablet Take 1 tablet by mouth daily.    . nebivolol (BYSTOLIC) 5 MG tablet Take 1 tablet by mouth daily 30 tablet 1  . sertraline (ZOLOFT) 50 MG tablet Take 1 tablet (50 mg total) by mouth daily. 90 tablet 1  . valACYclovir (VALTREX) 500 MG tablet Take 500 mg by mouth daily as needed.    Marland Kitchen VITAMIN D, CHOLECALCIFEROL, PO Take 5,000 Units by mouth daily.     No current facility-administered medications on file prior to visit.     PAST MEDICAL HISTORY: Past Medical History:  Diagnosis Date  . ADD (attention deficit disorder)   . Allergy    allergic rhinitis  . Anemia    nos  . Anxiety   . Arthritis    right ankle  . Asthma    as a child  . Back pain   . Constipation   . Depression   . Elevated blood pressure reading without diagnosis of hypertension   . Genital herpes   . Lactose intolerance   . Migraine   . Prediabetes   . Sleep apnea   . Stress   . Swelling     PAST SURGICAL HISTORY: Past Surgical History:  Procedure Laterality Date  . ANKLE SURGERY      SOCIAL HISTORY: Social History   Tobacco Use  . Smoking status: Never Smoker  . Smokeless tobacco: Never Used  Substance Use Topics  . Alcohol use: No  . Drug  use: No    FAMILY HISTORY: Family History  Problem Relation Age of Onset  . Hypertension Mother   . Diabetes Mother   . Hyperlipidemia Mother   . Hypertension Father   . Sleep apnea Father   . Alcoholism Father   . Obesity Father   . Hypertension Other   . Diabetes Maternal Aunt   . Diabetes Maternal Uncle   . Heart failure Paternal Aunt   . Diabetes Paternal Aunt   . Heart failure Maternal Grandmother   . Heart failure Maternal Grandfather     ROS: Review of Systems  Constitutional: Negative for weight loss.  Cardiovascular: Negative for chest pain and claudication.  Musculoskeletal: Negative for myalgias.    PHYSICAL EXAM: Pt in no acute distress  RECENT LABS AND TESTS: BMET    Component Value Date/Time   NA 138 07/17/2018 1313   K 4.2 07/17/2018 1313   CL 99 07/17/2018 1313   CO2 26 07/17/2018 1313   GLUCOSE 85 07/17/2018  1313   GLUCOSE 74 04/03/2016 1017   BUN 10 07/17/2018 1313   CREATININE 0.70 07/17/2018 1313   CREATININE 0.74 04/03/2016 1017   CALCIUM 9.0 07/17/2018 1313   GFRNONAA 104 07/17/2018 1313   GFRAA 119 07/17/2018 1313   Lab Results  Component Value Date   HGBA1C 5.6 07/17/2018   HGBA1C 5.6 04/26/2018   HGBA1C 6.0 (H) 02/08/2011   HGBA1C 5.5 10/25/2009   HGBA1C 5.8 01/03/2008   Lab Results  Component Value Date   INSULIN 7.3 07/17/2018   CBC    Component Value Date/Time   WBC 6.6 07/17/2018 1313   WBC 7.4 06/30/2011 1631   RBC 4.74 07/17/2018 1313   RBC 4.79 06/30/2011 1631   HGB 11.9 07/17/2018 1313   HCT 37.7 07/17/2018 1313   PLT 360 06/30/2011 1631   MCV 80 07/17/2018 1313   MCH 25.1 (L) 07/17/2018 1313   MCH 24.0 (L) 06/30/2011 1631   MCHC 31.6 07/17/2018 1313   MCHC 31.3 06/30/2011 1631   RDW 15.1 07/17/2018 1313   LYMPHSABS 2.0 07/17/2018 1313   MONOABS 0.5 06/30/2011 1631   EOSABS 0.1 07/17/2018 1313   BASOSABS 0.1 07/17/2018 1313   Iron/TIBC/Ferritin/ %Sat    Component Value Date/Time   IRON 41 (L)  12/17/2012 1440   FERRITIN 22.8 12/17/2012 1440   IRONPCTSAT 11.2 (L) 12/17/2012 1440   Lipid Panel     Component Value Date/Time   CHOL 181 07/17/2018 1313   TRIG 44 07/17/2018 1313   HDL 59 07/17/2018 1313   CHOLHDL 3.3 02/08/2011 1035   VLDL 8 02/08/2011 1035   LDLCALC 113 (H) 07/17/2018 1313   Hepatic Function Panel     Component Value Date/Time   PROT 7.0 07/17/2018 1313   ALBUMIN 3.9 07/17/2018 1313   AST 16 07/17/2018 1313   ALT 14 07/17/2018 1313   ALKPHOS 90 07/17/2018 1313   BILITOT 0.2 07/17/2018 1313   BILIDIR 0.1 02/08/2011 1035   IBILI 0.3 02/08/2011 1035      Component Value Date/Time   TSH 0.935 07/17/2018 1313   TSH 1.30 04/03/2016 1017   TSH 1.023 06/30/2011 1631      I, Trixie Dredge, am acting as transcriptionist for Ilene Qua, MD  I have reviewed the above documentation for accuracy and completeness, and I agree with the above. - Ilene Qua, MD

## 2018-12-06 ENCOUNTER — Telehealth: Payer: Self-pay

## 2018-12-06 ENCOUNTER — Encounter: Payer: Self-pay | Admitting: Internal Medicine

## 2018-12-06 NOTE — Telephone Encounter (Signed)
error 

## 2018-12-17 ENCOUNTER — Other Ambulatory Visit: Payer: Self-pay

## 2018-12-17 ENCOUNTER — Encounter (INDEPENDENT_AMBULATORY_CARE_PROVIDER_SITE_OTHER): Payer: Self-pay | Admitting: Family Medicine

## 2018-12-17 ENCOUNTER — Ambulatory Visit (INDEPENDENT_AMBULATORY_CARE_PROVIDER_SITE_OTHER): Payer: BC Managed Care – PPO | Admitting: Family Medicine

## 2018-12-17 VITALS — BP 143/91 | HR 67 | Temp 98.1°F | Ht 62.0 in | Wt 278.0 lb

## 2018-12-17 DIAGNOSIS — Z6841 Body Mass Index (BMI) 40.0 and over, adult: Secondary | ICD-10-CM

## 2018-12-17 DIAGNOSIS — Z9189 Other specified personal risk factors, not elsewhere classified: Secondary | ICD-10-CM | POA: Diagnosis not present

## 2018-12-17 DIAGNOSIS — E8881 Metabolic syndrome: Secondary | ICD-10-CM | POA: Diagnosis not present

## 2018-12-17 DIAGNOSIS — E7849 Other hyperlipidemia: Secondary | ICD-10-CM

## 2018-12-18 NOTE — Progress Notes (Signed)
Office: [phone]  /  Fax: [phone]   HPI:   Chief Complaint: OBESITY Brooke [name] is here to discuss her progress with her obesity treatment plan. She is on the Category 2 plan and is following her eating plan approximately 25 % of the time. She states she is exercising 0 minutes 0 times per week. Brooke [name] voices that the last few weeks have been difficult to follow the plan. Patient got vegetables from her parents farm. She is not exercising. Brooke [name] is getting used to a new schedule. Her weight is 278 lb (126.1 kg) today and has had a weight gain of 9 pounds since her last in-office visit. She has lost 3 lbs since starting treatment with [place].  Hyperlipidemia Brooke [name] has hyperlipidemia and has been trying to improve her cholesterol levels with intensive lifestyle modification including a low saturated fat diet, exercise and weight loss. Fasting lipid panel that was done at the end of January shows LDL of 113. Brooke [name] is not on statin. She denies any chest pain, claudication or myalgias.  At risk for cardiovascular disease Brooke [name] is at a higher than average risk for cardiovascular disease due to obesity and hyperlipidemia. She currently denies any chest pain.  Insulin Resistance Brooke [name] has a diagnosis of insulin resistance based on her elevated fasting insulin level >5. Although Brooke [name]'s blood glucose readings are still under good control, insulin resistance puts her at greater risk of metabolic syndrome and diabetes. Her last A1c was at 5.6 and last insulin level was at 7.6 Brooke [name] is not on metformin. She continues to work on diet and exercise to decrease risk of diabetes.  ASSESSMENT AND PLAN:  Other hyperlipidemia - Plan: VITAMIN D 25 Hydroxy (Vit-D Deficiency, Fractures)  Insulin resistance - Plan: Hemoglobin A1c, Insulin, random, VITAMIN D 25 Hydroxy (Vit-D Deficiency, Fractures)  At risk for heart disease  Class 3 severe obesity with serious comorbidity and  body mass index (BMI) of 50.0 to 59.9 in adult, unspecified obesity type ([place])  PLAN:  Hyperlipidemia Brooke [name] was informed of the American Heart Association Guidelines emphasizing intensive lifestyle modifications as the first line treatment for hyperlipidemia. We discussed many lifestyle modifications today in depth, and Brooke [name] will continue to work on decreasing saturated fats such as fatty red meat, butter and many fried foods. She will also increase vegetables and lean protein in her diet and continue to work on exercise and weight loss efforts. We will check fasting lipid panel and Brooke [name] will follow up as directed.  Cardiovascular risk counseling Brooke [name] was given extended (15 minutes) coronary artery disease prevention counseling today. She is 48 y.o. female and has risk factors for heart disease including obesity and hyperlipidemia. We discussed intensive lifestyle modifications today with an emphasis on specific weight loss instructions and strategies. Pt was also informed of the importance of increasing exercise and decreasing saturated fats to help prevent heart disease.  Insulin Resistance Brooke [name] will continue to work on weight loss, exercise, and decreasing simple carbohydrates in her diet to help decrease the risk of diabetes. She was informed that eating too many simple carbohydrates or too many calories at one sitting increases the likelihood of GI side effects. We will check Hgb A1c and insulin level and Brooke [name] agreed to follow up with [place] as directed to monitor her progress.  Obesity Brooke [name] is currently in the action stage of change. As such, her goal is to continue with weight loss efforts She has agreed to follow the Category 2 plan Brooke [name] has been instructed to  work up to a goal of 150 minutes of combined cardio and strengthening exercise per week for weight loss and overall health benefits. We discussed the following Behavioral Modification Strategies  today: planning for success, keeping healthy foods in the home, increasing lean protein intake and work on meal planning and easy cooking plans  Brooke [name] has agreed to follow up with our clinic in 2 weeks. She was informed of the importance of frequent follow up visits to maximize her success with intensive lifestyle modifications for her multiple health conditions.  ALLERGIES: No Known Allergies  MEDICATIONS: Current Outpatient Medications on File Prior to Visit  Medication Sig Dispense Refill  . calcium carbonate (OSCAL) 1500 (600 Ca) MG TABS tablet Take 1 tablet by mouth daily.    . hydrochlorothiazide (MICROZIDE) 12.5 MG capsule TAKE ONE CAPSULE BY MOUTH EVERY DAY 90 capsule 1  . lisdexamfetamine (VYVANSE) 40 MG capsule Take 1 capsule (40 mg total) by mouth every morning. 90 capsule 0  . Magnesium Glycinate POWD 400 mg by Does not apply route daily. 100 g 0  . Melatonin 5 MG TABS Take 10 mg by mouth at bedtime.     . Multiple Vitamin (MULTIVITAMIN) tablet Take 1 tablet by mouth daily.    . nebivolol (BYSTOLIC) 5 MG tablet Take 1 tablet by mouth daily 30 tablet 1  . sertraline (ZOLOFT) 50 MG tablet Take 1 tablet (50 mg total) by mouth daily. 90 tablet 1  . valACYclovir (VALTREX) 500 MG tablet Take 500 mg by mouth daily as needed.    Brooke [name] Kitchen VITAMIN D, CHOLECALCIFEROL, PO Take 5,000 Units by mouth daily.     No current facility-administered medications on file prior to visit.     PAST MEDICAL HISTORY: Past Medical History:  Diagnosis Date  . ADD (attention deficit disorder)   . Allergy    allergic rhinitis  . Anemia    nos  . Anxiety   . Arthritis    right ankle  . Asthma    as a child  . Back pain   . Constipation   . Depression   . Elevated blood pressure reading without diagnosis of hypertension   . Genital herpes   . Lactose intolerance   . Migraine   . Prediabetes   . Sleep apnea   . Stress   . Swelling     PAST SURGICAL HISTORY: Past Surgical History:  Procedure  Laterality Date  . ANKLE SURGERY      SOCIAL HISTORY: Social History   Tobacco Use  . Smoking status: Never Smoker  . Smokeless tobacco: Never Used  Substance Use Topics  . Alcohol use: No  . Drug use: No    FAMILY HISTORY: Family History  Problem Relation Age of Onset  . Hypertension Mother   . Diabetes Mother   . Hyperlipidemia Mother   . Hypertension Father   . Sleep apnea Father   . Alcoholism Father   . Obesity Father   . Hypertension Other   . Diabetes Maternal Aunt   . Diabetes Maternal Uncle   . Heart failure Paternal Aunt   . Diabetes Paternal Aunt   . Heart failure Maternal Grandmother   . Heart failure Maternal Grandfather     ROS: Review of Systems  Constitutional: Negative for weight loss.  Cardiovascular: Negative for chest pain and claudication.  Musculoskeletal: Negative for myalgias.    PHYSICAL EXAM: Blood pressure (!) 143/91, pulse 67, temperature 98.1 F (36.7 C), height 5\' 2"  (1.575 m), weight 278  lb (126.1 kg), last menstrual period 12/05/2018, SpO2 98 %. Body mass index is 50.85 kg/m. Physical Exam Vitals signs reviewed.  Constitutional:      Appearance: Normal appearance. She is well-developed. She is obese.  Cardiovascular:     Rate and Rhythm: Normal rate.  Pulmonary:     Effort: Pulmonary effort is normal.  Musculoskeletal: Normal range of motion.  Skin:    General: Skin is warm and dry.  Neurological:     Mental Status: She is alert and oriented to person, place, and time.  Psychiatric:        Mood and Affect: Mood normal.        Behavior: Behavior normal.     RECENT LABS AND TESTS: BMET    Component Value Date/Time   NA 138 07/17/2018 1313   K 4.2 07/17/2018 1313   CL 99 07/17/2018 1313   CO2 26 07/17/2018 1313   GLUCOSE 85 07/17/2018 1313   GLUCOSE 74 04/03/2016 1017   BUN 10 07/17/2018 1313   CREATININE 0.70 07/17/2018 1313   CREATININE 0.74 04/03/2016 1017   CALCIUM 9.0 07/17/2018 1313   GFRNONAA 104  07/17/2018 1313   GFRAA 119 07/17/2018 1313   Lab Results  Component Value Date   HGBA1C 5.6 07/17/2018   HGBA1C 5.6 04/26/2018   HGBA1C 6.0 (H) 02/08/2011   HGBA1C 5.5 10/25/2009   HGBA1C 5.8 01/03/2008   Lab Results  Component Value Date   INSULIN 7.3 07/17/2018   CBC    Component Value Date/Time   WBC 6.6 07/17/2018 1313   WBC 7.4 06/30/2011 1631   RBC 4.74 07/17/2018 1313   RBC 4.79 06/30/2011 1631   HGB 11.9 07/17/2018 1313   HCT 37.7 07/17/2018 1313   PLT 360 06/30/2011 1631   MCV 80 07/17/2018 1313   MCH 25.1 (L) 07/17/2018 1313   MCH 24.0 (L) 06/30/2011 1631   MCHC 31.6 07/17/2018 1313   MCHC 31.3 06/30/2011 1631   RDW 15.1 07/17/2018 1313   LYMPHSABS 2.0 07/17/2018 1313   MONOABS 0.5 06/30/2011 1631   EOSABS 0.1 07/17/2018 1313   BASOSABS 0.1 07/17/2018 1313   Iron/TIBC/Ferritin/ %Sat    Component Value Date/Time   IRON 41 (L) 12/17/2012 1440   FERRITIN 22.8 12/17/2012 1440   IRONPCTSAT 11.2 (L) 12/17/2012 1440   Lipid Panel     Component Value Date/Time   CHOL 181 07/17/2018 1313   TRIG 44 07/17/2018 1313   HDL 59 07/17/2018 1313   CHOLHDL 3.3 02/08/2011 1035   VLDL 8 02/08/2011 1035   LDLCALC 113 (H) 07/17/2018 1313   Hepatic Function Panel     Component Value Date/Time   PROT 7.0 07/17/2018 1313   ALBUMIN 3.9 07/17/2018 1313   AST 16 07/17/2018 1313   ALT 14 07/17/2018 1313   ALKPHOS 90 07/17/2018 1313   BILITOT 0.2 07/17/2018 1313   BILIDIR 0.1 02/08/2011 1035   IBILI 0.3 02/08/2011 1035      Component Value Date/Time   TSH 0.935 07/17/2018 1313   TSH 1.30 04/03/2016 1017   TSH 1.023 06/30/2011 1631     Ref. Range 07/17/2018 13:13  Vitamin D, 25-Hydroxy Latest Ref Range: 30.0 - 100.0 ng/mL 49.1    OBESITY BEHAVIORAL INTERVENTION VISIT  Today's visit was # 7   Starting weight: 281 lbs Starting date: 07/17/2018 Today's weight : 278 lbs  Today's date: 12/17/2018 Total lbs lost to date: 3    12/17/2018  Height 5\' 2"  (1.575 m)   Weight  278 lb (126.1 kg)  BMI (Calculated) 50.83  BLOOD PRESSURE - SYSTOLIC 670  BLOOD PRESSURE - DIASTOLIC 91   Body Fat % 11.0 %    ASK: We discussed the diagnosis of obesity with Brooke [name] today and Brooke [name] agreed to give [place] permission to discuss obesity behavioral modification therapy today.  ASSESS: Brooke [name] has the diagnosis of obesity and her BMI today is 50.83 Yalitza is in the action stage of change   ADVISE: Linetta was educated on the multiple health risks of obesity as well as the benefit of weight loss to improve her health. She was advised of the need for long term treatment and the importance of lifestyle modifications to improve her current health and to decrease her risk of future health problems.  AGREE: Multiple dietary modification options and treatment options were discussed and  Brooke [name] agreed to follow the recommendations documented in the above note.  ARRANGE: Brooke [name] was educated on the importance of frequent visits to treat obesity as outlined per CMS and USPSTF guidelines and agreed to schedule her next follow up appointment today.  I, Doreene Nest, am acting as transcriptionist for Eber Jones, MD  I have reviewed the above documentation for accuracy and completeness, and I agree with the above. - Ilene Qua, MD

## 2018-12-30 ENCOUNTER — Encounter (INDEPENDENT_AMBULATORY_CARE_PROVIDER_SITE_OTHER): Payer: Self-pay | Admitting: Family Medicine

## 2019-01-01 ENCOUNTER — Ambulatory Visit (INDEPENDENT_AMBULATORY_CARE_PROVIDER_SITE_OTHER): Payer: BC Managed Care – PPO | Admitting: Family Medicine

## 2019-01-07 LAB — VITAMIN D 25 HYDROXY (VIT D DEFICIENCY, FRACTURES): Vit D, 25-Hydroxy: 47.2 ng/mL (ref 30.0–100.0)

## 2019-01-07 LAB — HEMOGLOBIN A1C
Est. average glucose Bld gHb Est-mCnc: 120 mg/dL
Hgb A1c MFr Bld: 5.8 % — ABNORMAL HIGH (ref 4.8–5.6)

## 2019-01-07 LAB — INSULIN, RANDOM: INSULIN: 16.6 u[IU]/mL (ref 2.6–24.9)

## 2019-01-13 ENCOUNTER — Encounter (INDEPENDENT_AMBULATORY_CARE_PROVIDER_SITE_OTHER): Payer: Self-pay | Admitting: Family Medicine

## 2019-01-13 ENCOUNTER — Other Ambulatory Visit: Payer: Self-pay

## 2019-01-13 ENCOUNTER — Ambulatory Visit (INDEPENDENT_AMBULATORY_CARE_PROVIDER_SITE_OTHER): Payer: BC Managed Care – PPO | Admitting: Family Medicine

## 2019-01-13 ENCOUNTER — Other Ambulatory Visit: Payer: Self-pay | Admitting: Internal Medicine

## 2019-01-13 VITALS — BP 124/87 | HR 71 | Temp 98.2°F | Ht 62.0 in | Wt 281.0 lb

## 2019-01-13 DIAGNOSIS — R7303 Prediabetes: Secondary | ICD-10-CM | POA: Diagnosis not present

## 2019-01-13 DIAGNOSIS — Z6841 Body Mass Index (BMI) 40.0 and over, adult: Secondary | ICD-10-CM

## 2019-01-13 DIAGNOSIS — Z9189 Other specified personal risk factors, not elsewhere classified: Secondary | ICD-10-CM | POA: Diagnosis not present

## 2019-01-13 DIAGNOSIS — E559 Vitamin D deficiency, unspecified: Secondary | ICD-10-CM

## 2019-01-13 MED ORDER — METFORMIN HCL 500 MG PO TABS: 500.0000 mg | ORAL_TABLET | Freq: Every day | ORAL | 0 refills | Status: DC

## 2019-01-15 ENCOUNTER — Encounter: Payer: Self-pay | Admitting: Internal Medicine

## 2019-01-15 NOTE — Progress Notes (Signed)
Office: [phone]  /  Fax: [phone]   HPI:   Chief Complaint: OBESITY Brooke [name] is here to discuss her progress with her obesity treatment plan. She is on the Category 2 plan and is following her eating plan approximately 50 % of the time. She states she is walking 15 to 30 minutes 3 to 4 times per week. Brooke [name] voices she is frustrated with herself, especially with following the plan. She is still skipping meals , and she is occasionally skipping meals. Brooke [name] voices she knows she doesn't always make the best choices but she tries to make good choices when she can. Her weight is 281 lb (127.5 kg) today and has had a weight gain of 3 pounds over a period of 4 weeks since her last visit. She has lost 0 lbs since starting treatment with [place].  Pre-Diabetes Brooke [name] has a diagnosis of prediabetes based on her elevated Hgb A1c and was informed this puts her at greater risk of developing diabetes. Brooke [name] is not on medications currently and she continues to work on diet and exercise to decrease risk of diabetes. She denies nausea or hypoglycemia.  At risk for diabetes Brooke [name] is at higher than average risk for developing diabetes due to her obesity and prediabetes. She currently denies polyuria or polydipsia.  Vitamin D deficiency Brooke [name] has a diagnosis of vitamin D deficiency. She is currently taking vit D. Brooke [name] admits fatigue and she denies nausea, vomiting or muscle weakness.  ASSESSMENT AND PLAN:  Prediabetes  Vitamin D deficiency  At risk for diabetes mellitus  Class 3 severe obesity with serious comorbidity and body mass index (BMI) of 50.0 to 59.9 in adult, unspecified obesity type Brooke [name])  PLAN:  Pre-Diabetes Brooke [name] will continue to work on weight loss, exercise, and decreasing simple carbohydrates in her diet to help decrease the risk of diabetes. We dicussed metformin including benefits and risks. She was informed that eating too many simple  carbohydrates or too many calories at one sitting increases the likelihood of GI side effects. Brooke [name] agrees to start metformin 500 mg qAM #30 with no refills and follow up with [place] as directed to monitor her progress.  Diabetes risk counseling Brooke [name] was given extended (15 minutes) diabetes prevention counseling today. She is 48 y.o. female and has risk factors for diabetes including obesity and prediabetes. We discussed intensive lifestyle modifications today with an emphasis on weight loss as well as increasing exercise and decreasing simple carbohydrates in her diet.  Vitamin D Deficiency Brooke [name] was informed that low vitamin D levels contributes to fatigue and are associated with obesity, breast, and colon cancer. She will continue OTC vitamin D and will follow up for routine testing of vitamin D, at least 2-3 times per year. She was informed of the risk of over-replacement of vitamin D and agrees to not increase her dose unless she discusses this with [place] first.  Obesity Brooke [name] is currently in the action stage of change. As such, her goal is to continue with weight loss efforts She has agreed to follow the Category 2 plan Brooke [name] has been instructed to work up to a goal of 150 minutes of combined cardio and strengthening exercise per week for weight loss and overall health benefits. We discussed the following Behavioral Modification Strategies today: planning for success, keeping healthy foods in the home, increasing lean protein intake, increasing vegetables and work on meal planning and easy cooking plans  Brooke [name] has agreed to follow up with our clinic in 2 weeks. She  was informed of the importance of frequent follow up visits to maximize her success with intensive lifestyle modifications for her multiple health conditions.  ALLERGIES: No Known Allergies  MEDICATIONS: Current Outpatient Medications on File Prior to Visit  Medication Sig Dispense Refill  . calcium  carbonate (OSCAL) 1500 (600 Ca) MG TABS tablet Take 1 tablet by mouth daily.    . hydrochlorothiazide (MICROZIDE) 12.5 MG capsule TAKE ONE CAPSULE BY MOUTH EVERY DAY 90 capsule 1  . lisdexamfetamine (VYVANSE) 40 MG capsule Take 1 capsule (40 mg total) by mouth every morning. 90 capsule 0  . Magnesium Glycinate POWD 400 mg by Does not apply route daily. 100 g 0  . Melatonin 5 MG TABS Take 10 mg by mouth at bedtime.     . Multiple Vitamin (MULTIVITAMIN) tablet Take 1 tablet by mouth daily.    . sertraline (ZOLOFT) 50 MG tablet Take 1 tablet (50 mg total) by mouth daily. 90 tablet 1  . valACYclovir (VALTREX) 500 MG tablet Take 500 mg by mouth daily as needed.    Marland Kitchen VITAMIN D, CHOLECALCIFEROL, PO Take 5,000 Units by mouth daily.     No current facility-administered medications on file prior to visit.     PAST MEDICAL HISTORY: Past Medical History:  Diagnosis Date  . ADD (attention deficit disorder)   . Allergy    allergic rhinitis  . Anemia    nos  . Anxiety   . Arthritis    right ankle  . Asthma    as a child  . Back pain   . Constipation   . Depression   . Elevated blood pressure reading without diagnosis of hypertension   . Genital herpes   . Lactose intolerance   . Migraine   . Prediabetes   . Sleep apnea   . Stress   . Swelling     PAST SURGICAL HISTORY: Past Surgical History:  Procedure Laterality Date  . ANKLE SURGERY      SOCIAL HISTORY: Social History   Tobacco Use  . Smoking status: Never Smoker  . Smokeless tobacco: Never Used  Substance Use Topics  . Alcohol use: No  . Drug use: No    FAMILY HISTORY: Family History  Problem Relation Age of Onset  . Hypertension Mother   . Diabetes Mother   . Hyperlipidemia Mother   . Hypertension Father   . Sleep apnea Father   . Alcoholism Father   . Obesity Father   . Hypertension Other   . Diabetes Maternal Aunt   . Diabetes Maternal Uncle   . Heart failure Paternal Aunt   . Diabetes Paternal Aunt   .  Heart failure Maternal Grandmother   . Heart failure Maternal Grandfather     ROS: Review of Systems  Constitutional: Positive for malaise/fatigue. Negative for weight loss.  Gastrointestinal: Negative for nausea and vomiting.  Genitourinary: Negative for frequency.  Musculoskeletal:       Negative for muscle weakness  Endo/Heme/Allergies: Negative for polydipsia.    PHYSICAL EXAM: Blood pressure 124/87, pulse 71, temperature 98.2 F (36.8 C), temperature source Oral, height 5\' 2"  (1.575 m), weight 281 lb (127.5 kg), SpO2 96 %. Body mass index is 51.4 kg/m. Physical Exam Vitals signs reviewed.  Constitutional:      Appearance: Normal appearance. She is well-developed. She is obese.  Cardiovascular:     Rate and Rhythm: Normal rate.  Pulmonary:     Effort: Pulmonary effort is normal.  Musculoskeletal: Normal range of motion.  Skin:    General: Skin is warm and dry.  Neurological:     Mental Status: She is alert and oriented to person, place, and time.  Psychiatric:        Mood and Affect: Mood normal.        Behavior: Behavior normal.     RECENT LABS AND TESTS: BMET    Component Value Date/Time   NA 138 07/17/2018 1313   K 4.2 07/17/2018 1313   CL 99 07/17/2018 1313   CO2 26 07/17/2018 1313   GLUCOSE 85 07/17/2018 1313   GLUCOSE 74 04/03/2016 1017   BUN 10 07/17/2018 1313   CREATININE 0.70 07/17/2018 1313   CREATININE 0.74 04/03/2016 1017   CALCIUM 9.0 07/17/2018 1313   GFRNONAA 104 07/17/2018 1313   GFRAA 119 07/17/2018 1313   Lab Results  Component Value Date   HGBA1C 5.8 (H) 01/06/2019   HGBA1C 5.6 07/17/2018   HGBA1C 5.6 04/26/2018   HGBA1C 6.0 (H) 02/08/2011   HGBA1C 5.5 10/25/2009   Lab Results  Component Value Date   INSULIN 16.6 01/06/2019   INSULIN 7.3 07/17/2018   CBC    Component Value Date/Time   WBC 6.6 07/17/2018 1313   WBC 7.4 06/30/2011 1631   RBC 4.74 07/17/2018 1313   RBC 4.79 06/30/2011 1631   HGB 11.9 07/17/2018 1313   HCT  37.7 07/17/2018 1313   PLT 360 06/30/2011 1631   MCV 80 07/17/2018 1313   MCH 25.1 (L) 07/17/2018 1313   MCH 24.0 (L) 06/30/2011 1631   MCHC 31.6 07/17/2018 1313   MCHC 31.3 06/30/2011 1631   RDW 15.1 07/17/2018 1313   LYMPHSABS 2.0 07/17/2018 1313   MONOABS 0.5 06/30/2011 1631   EOSABS 0.1 07/17/2018 1313   BASOSABS 0.1 07/17/2018 1313   Iron/TIBC/Ferritin/ %Sat    Component Value Date/Time   IRON 41 (L) 12/17/2012 1440   FERRITIN 22.8 12/17/2012 1440   IRONPCTSAT 11.2 (L) 12/17/2012 1440   Lipid Panel     Component Value Date/Time   CHOL 181 07/17/2018 1313   TRIG 44 07/17/2018 1313   HDL 59 07/17/2018 1313   CHOLHDL 3.3 02/08/2011 1035   VLDL 8 02/08/2011 1035   LDLCALC 113 (H) 07/17/2018 1313   Hepatic Function Panel     Component Value Date/Time   PROT 7.0 07/17/2018 1313   ALBUMIN 3.9 07/17/2018 1313   AST 16 07/17/2018 1313   ALT 14 07/17/2018 1313   ALKPHOS 90 07/17/2018 1313   BILITOT 0.2 07/17/2018 1313   BILIDIR 0.1 02/08/2011 1035   IBILI 0.3 02/08/2011 1035      Component Value Date/Time   TSH 0.935 07/17/2018 1313   TSH 1.30 04/03/2016 1017   TSH 1.023 06/30/2011 1631     Ref. Range 01/06/2019 10:08  Vitamin D, 25-Hydroxy Latest Ref Range: 30.0 - 100.0 ng/mL 47.2    OBESITY BEHAVIORAL INTERVENTION VISIT  Today's visit was # 8   Starting weight: 281 lbs Starting date: 07/17/2018 Today's weight : 281 lbs  Today's date: 01/13/2019 Total lbs lost to date: 0    01/13/2019  Height 5\' 2"  (1.575 m)  Weight 281 lb (127.5 kg)  BMI (Calculated) 51.38  BLOOD PRESSURE - SYSTOLIC 409  BLOOD PRESSURE - DIASTOLIC 87   Body Fat % 81.1 %  Total Body Water (lbs) 101.4 lbs    ASK: We discussed the diagnosis of obesity with Brooke [name] today and Daily agreed to give [place] permission to discuss obesity behavioral modification  therapy today.  ASSESS: Laqueshia has the diagnosis of obesity and her BMI today is 51.38 Veneda is in the action  stage of change   ADVISE: Jaylanie was educated on the multiple health risks of obesity as well as the benefit of weight loss to improve her health. She was advised of the need for long term treatment and the importance of lifestyle modifications to improve her current health and to decrease her risk of future health problems.  AGREE: Multiple dietary modification options and treatment options were discussed and  Aliyah agreed to follow the recommendations documented in the above note.  ARRANGE: Quyen was educated on the importance of frequent visits to treat obesity as outlined per CMS and USPSTF guidelines and agreed to schedule her next follow up appointment today.  I, Doreene Nest, am acting as transcriptionist for Eber Jones, MD  I have reviewed the above documentation for accuracy and completeness, and I agree with the above. - Ilene Qua, MD

## 2019-01-19 ENCOUNTER — Other Ambulatory Visit: Payer: Self-pay | Admitting: Internal Medicine

## 2019-01-19 MED ORDER — LISDEXAMFETAMINE DIMESYLATE 40 MG PO CAPS: 40.0000 mg | ORAL_CAPSULE | ORAL | 0 refills | Status: DC

## 2019-01-20 ENCOUNTER — Encounter: Payer: Self-pay | Admitting: Internal Medicine

## 2019-01-27 ENCOUNTER — Other Ambulatory Visit: Payer: Self-pay

## 2019-01-27 ENCOUNTER — Ambulatory Visit (INDEPENDENT_AMBULATORY_CARE_PROVIDER_SITE_OTHER): Payer: BC Managed Care – PPO | Admitting: Family Medicine

## 2019-01-27 ENCOUNTER — Encounter (INDEPENDENT_AMBULATORY_CARE_PROVIDER_SITE_OTHER): Payer: Self-pay | Admitting: Family Medicine

## 2019-01-27 VITALS — BP 125/82 | HR 69 | Temp 98.1°F | Ht 62.0 in | Wt 282.0 lb

## 2019-01-27 DIAGNOSIS — Z6841 Body Mass Index (BMI) 40.0 and over, adult: Secondary | ICD-10-CM | POA: Diagnosis not present

## 2019-01-27 DIAGNOSIS — E559 Vitamin D deficiency, unspecified: Secondary | ICD-10-CM | POA: Diagnosis not present

## 2019-01-27 DIAGNOSIS — R7303 Prediabetes: Secondary | ICD-10-CM | POA: Diagnosis not present

## 2019-01-27 NOTE — Progress Notes (Signed)
Office: [phone]  /  Fax: [phone]   HPI:   Chief Complaint: OBESITY Brooke [name] is here to discuss her progress with her obesity treatment plan. She is on the Category 2 plan and is following her eating plan approximately 80 % of the time. She states she is walking for 30 minutes 3-4 times per week. Brooke [name] cooked a sweet treat this past weekend and indulged until food was gone. She does feel she doesn't drink enough water.  Her weight is 282 lb (127.9 kg) today and has gained 1 lb since her last visit. She has lost 0 lbs since starting treatment with [place].  Pre-Diabetes Brooke [name] has a diagnosis of pre-diabetes based on her elevated Hgb A1c and was informed this puts her at greater risk of developing diabetes. She is taking metformin currently and denies GI side effects. She continues to work on diet and exercise to decrease risk of diabetes.   Vitamin D Deficiency Brooke [name] has a diagnosis of vitamin D deficiency. She is currently taking prescription Vit D. She notes fatigue and denies nausea, vomiting or muscle weakness.  ASSESSMENT AND PLAN:  Prediabetes  Vitamin D deficiency  Class 3 severe obesity with serious comorbidity and body mass index (BMI) of 50.0 to 59.9 in adult, unspecified obesity type Brooke [name])  PLAN:  Pre-Diabetes Brooke [name] will continue to work on weight loss, exercise, and decreasing simple carbohydrates in her diet to help decrease the risk of diabetes. We dicussed metformin including benefits and risks. She was informed that eating too many simple carbohydrates or too many calories at one sitting increases the likelihood of GI side effects. Brooke [name] agrees to continue taking metformin, no refill needed. Brooke [name] agrees to follow up with our clinic in 2 weeks as directed to monitor her progress.  Vitamin D Deficiency Brooke [name] was informed that low vitamin D levels contributes to fatigue and are associated with obesity, breast, and colon cancer. Brooke [name]  agrees to continue taking prescription Vit D 50,000 IU every week, no refill needed. She will follow up for routine testing of vitamin D, at least 2-3 times per year. She was informed of the risk of over-replacement of vitamin D and agrees to not increase her dose unless she discusses this with [place] first. Brooke [name] agrees to follow up with our clinic in 2 weeks.  I spent > than 50% of the 15 minute visit on counseling as documented in the note.  Obesity Brooke [name] is currently in the action stage of change. As such, her goal is to continue with weight loss efforts She has agreed to follow the Category 2 plan Brooke [name] has been instructed to work up to a goal of 150 minutes of combined cardio and strengthening exercise per week for weight loss and overall health benefits. We discussed the following Behavioral Modification Strategies today: increasing lean protein intake, increasing vegetables and work on meal planning and easy cooking plans, keeping healthy foods in the home, and planning for success   Brooke [name] has agreed to follow up with our clinic in 2 weeks. She was informed of the importance of frequent follow up visits to maximize her success with intensive lifestyle modifications for her multiple health conditions.  ALLERGIES: No Known Allergies  MEDICATIONS: Current Outpatient Medications on File Prior to Visit  Medication Sig Dispense Refill  . calcium carbonate (OSCAL) 1500 (600 Ca) MG TABS tablet Take 1 tablet by mouth daily.    . hydrochlorothiazide (MICROZIDE) 12.5 MG capsule TAKE ONE CAPSULE BY MOUTH EVERY DAY 90 capsule 1  .  lisdexamfetamine (VYVANSE) 40 MG capsule Take 1 capsule (40 mg total) by mouth every morning. 30 capsule 0  . Magnesium Glycinate POWD 400 mg by Does not apply route daily. 100 g 0  . Melatonin 5 MG TABS Take 10 mg by mouth at bedtime.     . metFORMIN (GLUCOPHAGE) 500 MG tablet Take 1 tablet (500 mg total) by mouth daily with breakfast. 30 tablet 0  .  Multiple Vitamin (MULTIVITAMIN) tablet Take 1 tablet by mouth daily.    . nebivolol (BYSTOLIC) 5 MG tablet TAKE 1 TABLET BY MOUTH DAILY 90 tablet 1  . sertraline (ZOLOFT) 50 MG tablet Take 1 tablet (50 mg total) by mouth daily. 90 tablet 1  . valACYclovir (VALTREX) 500 MG tablet Take 500 mg by mouth daily as needed.    Marland Kitchen VITAMIN D, CHOLECALCIFEROL, PO Take 5,000 Units by mouth daily.     No current facility-administered medications on file prior to visit.     PAST MEDICAL HISTORY: Past Medical History:  Diagnosis Date  . ADD (attention deficit disorder)   . Allergy    allergic rhinitis  . Anemia    nos  . Anxiety   . Arthritis    right ankle  . Asthma    as a child  . Back pain   . Constipation   . Depression   . Elevated blood pressure reading without diagnosis of hypertension   . Genital herpes   . Lactose intolerance   . Migraine   . Prediabetes   . Sleep apnea   . Stress   . Swelling     PAST SURGICAL HISTORY: Past Surgical History:  Procedure Laterality Date  . ANKLE SURGERY      SOCIAL HISTORY: Social History   Tobacco Use  . Smoking status: Never Smoker  . Smokeless tobacco: Never Used  Substance Use Topics  . Alcohol use: No  . Drug use: No    FAMILY HISTORY: Family History  Problem Relation Age of Onset  . Hypertension Mother   . Diabetes Mother   . Hyperlipidemia Mother   . Hypertension Father   . Sleep apnea Father   . Alcoholism Father   . Obesity Father   . Hypertension Other   . Diabetes Maternal Aunt   . Diabetes Maternal Uncle   . Heart failure Paternal Aunt   . Diabetes Paternal Aunt   . Heart failure Maternal Grandmother   . Heart failure Maternal Grandfather     ROS: Review of Systems  Constitutional: Positive for malaise/fatigue. Negative for weight loss.  Gastrointestinal: Negative for nausea and vomiting.  Musculoskeletal:       Negative muscle weakness    PHYSICAL EXAM: Blood pressure 125/82, pulse 69, temperature  98.1 F (36.7 C), temperature source Oral, height 5\' 2"  (1.575 m), weight 282 lb (127.9 kg), SpO2 98 %. Body mass index is 51.58 kg/m. Physical Exam Vitals signs reviewed.  Constitutional:      Appearance: Normal appearance. She is obese.  Cardiovascular:     Rate and Rhythm: Normal rate.     Pulses: Normal pulses.  Pulmonary:     Effort: Pulmonary effort is normal.     Breath sounds: Normal breath sounds.  Musculoskeletal: Normal range of motion.  Skin:    General: Skin is warm and dry.  Neurological:     Mental Status: She is alert and oriented to person, place, and time.  Psychiatric:        Mood and Affect: Mood normal.  Behavior: Behavior normal.     RECENT LABS AND TESTS: BMET    Component Value Date/Time   NA 138 07/17/2018 1313   K 4.2 07/17/2018 1313   CL 99 07/17/2018 1313   CO2 26 07/17/2018 1313   GLUCOSE 85 07/17/2018 1313   GLUCOSE 74 04/03/2016 1017   BUN 10 07/17/2018 1313   CREATININE 0.70 07/17/2018 1313   CREATININE 0.74 04/03/2016 1017   CALCIUM 9.0 07/17/2018 1313   GFRNONAA 104 07/17/2018 1313   GFRAA 119 07/17/2018 1313   Lab Results  Component Value Date   HGBA1C 5.8 (H) 01/06/2019   HGBA1C 5.6 07/17/2018   HGBA1C 5.6 04/26/2018   HGBA1C 6.0 (H) 02/08/2011   HGBA1C 5.5 10/25/2009   Lab Results  Component Value Date   INSULIN 16.6 01/06/2019   INSULIN 7.3 07/17/2018   CBC    Component Value Date/Time   WBC 6.6 07/17/2018 1313   WBC 7.4 06/30/2011 1631   RBC 4.74 07/17/2018 1313   RBC 4.79 06/30/2011 1631   HGB 11.9 07/17/2018 1313   HCT 37.7 07/17/2018 1313   PLT 360 06/30/2011 1631   MCV 80 07/17/2018 1313   MCH 25.1 (L) 07/17/2018 1313   MCH 24.0 (L) 06/30/2011 1631   MCHC 31.6 07/17/2018 1313   MCHC 31.3 06/30/2011 1631   RDW 15.1 07/17/2018 1313   LYMPHSABS 2.0 07/17/2018 1313   MONOABS 0.5 06/30/2011 1631   EOSABS 0.1 07/17/2018 1313   BASOSABS 0.1 07/17/2018 1313   Iron/TIBC/Ferritin/ %Sat    Component  Value Date/Time   IRON 41 (L) 12/17/2012 1440   FERRITIN 22.8 12/17/2012 1440   IRONPCTSAT 11.2 (L) 12/17/2012 1440   Lipid Panel     Component Value Date/Time   CHOL 181 07/17/2018 1313   TRIG 44 07/17/2018 1313   HDL 59 07/17/2018 1313   CHOLHDL 3.3 02/08/2011 1035   VLDL 8 02/08/2011 1035   LDLCALC 113 (H) 07/17/2018 1313   Hepatic Function Panel     Component Value Date/Time   PROT 7.0 07/17/2018 1313   ALBUMIN 3.9 07/17/2018 1313   AST 16 07/17/2018 1313   ALT 14 07/17/2018 1313   ALKPHOS 90 07/17/2018 1313   BILITOT 0.2 07/17/2018 1313   BILIDIR 0.1 02/08/2011 1035   IBILI 0.3 02/08/2011 1035      Component Value Date/Time   TSH 0.935 07/17/2018 1313   TSH 1.30 04/03/2016 1017   TSH 1.023 06/30/2011 1631      OBESITY BEHAVIORAL INTERVENTION VISIT  Today's visit was # 9   Starting weight: 281 lbs Starting date: 07/17/2018 Today's weight : 282 lbs  Today's date: 01/27/2019 Total lbs lost to date: 0    ASK: We discussed the diagnosis of obesity with Brooke [name] today and Brooke [name] agreed to give [place] permission to discuss obesity behavioral modification therapy today.  ASSESS: Brooke [name] has the diagnosis of obesity and her BMI today is 51.57 Brooke [name] is in the action stage of change   ADVISE: Brooke [name] was educated on the multiple health risks of obesity as well as the benefit of weight loss to improve her health. She was advised of the need for long term treatment and the importance of lifestyle modifications to improve her current health and to decrease her risk of future health problems.  AGREE: Multiple dietary modification options and treatment options were discussed and  Brooke [name] agreed to follow the recommendations documented in the above note.  ARRANGE: Brooke [name] was educated on the importance of frequent  visits to treat obesity as outlined per CMS and USPSTF guidelines and agreed to schedule her next follow up appointment today.  I,  Trixie Dredge, am acting as transcriptionist for Ilene Qua, MD  I have reviewed the above documentation for accuracy and completeness, and I agree with the above. - Ilene Qua, MD

## 2019-01-31 ENCOUNTER — Other Ambulatory Visit: Payer: Self-pay

## 2019-01-31 NOTE — Progress Notes (Signed)
Pt is taking metformin rx from [facility]

## 2019-02-10 ENCOUNTER — Encounter (INDEPENDENT_AMBULATORY_CARE_PROVIDER_SITE_OTHER): Payer: Self-pay

## 2019-02-10 ENCOUNTER — Ambulatory Visit (INDEPENDENT_AMBULATORY_CARE_PROVIDER_SITE_OTHER): Payer: BC Managed Care – PPO | Admitting: Family Medicine

## 2019-02-11 ENCOUNTER — Encounter (INDEPENDENT_AMBULATORY_CARE_PROVIDER_SITE_OTHER): Payer: Self-pay | Admitting: Family Medicine

## 2019-02-11 ENCOUNTER — Ambulatory Visit (INDEPENDENT_AMBULATORY_CARE_PROVIDER_SITE_OTHER): Payer: BC Managed Care – PPO | Admitting: Family Medicine

## 2019-02-11 ENCOUNTER — Other Ambulatory Visit: Payer: Self-pay

## 2019-02-11 ENCOUNTER — Ambulatory Visit
Admission: RE | Admit: 2019-02-11 | Discharge: 2019-02-11 | Disposition: A | Discharge status: Home / Self Care | Payer: BC Managed Care – PPO | Source: Ambulatory Visit | Attending: Internal Medicine | Admitting: Internal Medicine

## 2019-02-11 VITALS — BP 124/81 | HR 67 | Temp 97.8°F | Ht 62.0 in | Wt 280.0 lb

## 2019-02-11 DIAGNOSIS — R7303 Prediabetes: Secondary | ICD-10-CM

## 2019-02-11 DIAGNOSIS — Z9189 Other specified personal risk factors, not elsewhere classified: Secondary | ICD-10-CM | POA: Diagnosis not present

## 2019-02-11 DIAGNOSIS — Z6841 Body Mass Index (BMI) 40.0 and over, adult: Secondary | ICD-10-CM

## 2019-02-11 DIAGNOSIS — E559 Vitamin D deficiency, unspecified: Secondary | ICD-10-CM | POA: Diagnosis not present

## 2019-02-11 DIAGNOSIS — Z1231 Encounter for screening mammogram for malignant neoplasm of breast: Secondary | ICD-10-CM

## 2019-02-11 MED ORDER — METFORMIN HCL 500 MG PO TABS: 500.0000 mg | ORAL_TABLET | Freq: Every day | ORAL | 0 refills | Status: DC

## 2019-02-11 NOTE — Progress Notes (Signed)
Office: [phone]  /  Fax: [phone]   HPI:   Chief Complaint: OBESITY Brooke [name] is here to discuss her progress with her obesity treatment plan. She is on the  follow the Category 2 plan and is following her eating plan approximately 75-80 % of the time. She states she is exercising by walking at work for 20 minutes 4 times per week. Tiziana 's last few weeks have been very busy with work of schedules and reworking schedules.  Her weight is 280 lb (127 kg) today and has had a weight loss of 2 pounds over a period of 2 weeks since her last visit. She has lost 1 lbs since starting treatment with [place].  Pre-Diabetes Yaire has a diagnosis of prediabetes based on her elevated HgA1c and was informed this puts her at greater risk of developing diabetes. She is taking metformin currently and did occasionally have headaches but is better now. She continues to work on diet and exercise to decrease risk of diabetes. She denies nausea or hypoglycemia. She reports occasionally having cravings.   Vitamin D deficiency Shilo has a diagnosis of vitamin D deficiency. She is currently taking vit D and denies nausea, vomiting or muscle weakness.  At risk for diabetes Khushi is at higher than averagerisk for developing diabetes due to her obesity. She currently denies polyuria or polydipsia.  ASSESSMENT AND PLAN:  Prediabetes  Vitamin D deficiency  At risk for diabetes mellitus  Class 3 severe obesity with serious comorbidity and body mass index (BMI) of 50.0 to 59.9 in adult, unspecified obesity type [facility])  PLAN: Pre-Diabetes Katanya will continue to work on weight loss, exercise, and decreasing simple carbohydrates in her diet to help decrease the risk of diabetes. We dicussed metformin including benefits and risks. She was informed that eating too many simple carbohydrates or too many calories at one sitting increases the likelihood of GI side effects. Zavia agrees to  continue Metformin 500 mg qAm #30 with no refills. Sherle agreed to follow up with [place] as directed to monitor her progress.  Vitamin D Deficiency Guneet was informed that low vitamin D levels contributes to fatigue and are associated with obesity, breast, and colon cancer. She agrees to continue to take OTC vit D and will follow up for routine testing of vitamin D, at least 2-3 times per year. She was informed of the risk of over-replacement of vitamin D and agrees to not increase her dose unless she discusses this with [place] first. Agrees to follow up with our clinic as directed.   Diabetes risk counseling Bridget was given extended (15 minutes) diabetes prevention counseling today. She is 48 y.o. female and has risk factors for diabetes including obesity. We discussed intensive lifestyle modifications today with an emphasis on weight loss as well as increasing exercise and decreasing simple carbohydrates in her diet.  Obesity Evalena is currently in the action stage of change. As such, her goal is to continue with weight loss efforts She has agreed to follow the Category 2 plan Zoya has been instructed to work up to a goal of 150 minutes of combined cardio and strengthening exercise per week for weight loss and overall health benefits. We discussed the following Behavioral Modification Strategies today:planning for success, keeping healthy foods in the home,  increasing lean protein intake, increasing vegetables and work on meal planning and easy cooking plans   Harlym has agreed to follow up with our clinic in 2 weeks. She was informed of the importance of  frequent follow up visits to maximize her success with intensive lifestyle modifications for her multiple health conditions.  ALLERGIES: No Known Allergies  MEDICATIONS: Current Outpatient Medications on File Prior to Visit  Medication Sig Dispense Refill  . calcium carbonate (OSCAL) 1500 (600 Ca) MG TABS tablet Take 1  tablet by mouth daily.    . hydrochlorothiazide (MICROZIDE) 12.5 MG capsule TAKE ONE CAPSULE BY MOUTH EVERY DAY 90 capsule 1  . lisdexamfetamine (VYVANSE) 40 MG capsule Take 1 capsule (40 mg total) by mouth every morning. 30 capsule 0  . Magnesium Glycinate POWD 400 mg by Does not apply route daily. 100 g 0  . Melatonin 5 MG TABS Take 10 mg by mouth at bedtime.     . Multiple Vitamin (MULTIVITAMIN) tablet Take 1 tablet by mouth daily.    . nebivolol (BYSTOLIC) 5 MG tablet TAKE 1 TABLET BY MOUTH DAILY 90 tablet 1  . sertraline (ZOLOFT) 50 MG tablet Take 1 tablet (50 mg total) by mouth daily. 90 tablet 1  . valACYclovir (VALTREX) 500 MG tablet Take 500 mg by mouth daily as needed.    Marland Kitchen VITAMIN D, CHOLECALCIFEROL, PO Take 5,000 Units by mouth daily.     No current facility-administered medications on file prior to visit.     PAST MEDICAL HISTORY: Past Medical History:  Diagnosis Date  . ADD (attention deficit disorder)   . Allergy    allergic rhinitis  . Anemia    nos  . Anxiety   . Arthritis    right ankle  . Asthma    as a child  . Back pain   . Constipation   . Depression   . Elevated blood pressure reading without diagnosis of hypertension   . Genital herpes   . Lactose intolerance   . Migraine   . Prediabetes   . Sleep apnea   . Stress   . Swelling     PAST SURGICAL HISTORY: Past Surgical History:  Procedure Laterality Date  . ANKLE SURGERY      SOCIAL HISTORY: Social History   Tobacco Use  . Smoking status: Never Smoker  . Smokeless tobacco: Never Used  Substance Use Topics  . Alcohol use: No  . Drug use: No    FAMILY HISTORY: Family History  Problem Relation Age of Onset  . Hypertension Mother   . Diabetes Mother   . Hyperlipidemia Mother   . Hypertension Father   . Sleep apnea Father   . Alcoholism Father   . Obesity Father   . Hypertension Other   . Diabetes Maternal Aunt   . Diabetes Maternal Uncle   . Heart failure Paternal Aunt   .  Diabetes Paternal Aunt   . Heart failure Maternal Grandmother   . Heart failure Maternal Grandfather     ROS: Review of Systems  Constitutional: Positive for malaise/fatigue and weight loss.  Gastrointestinal: Negative for nausea and vomiting.  Musculoskeletal:       Negative for muscle weakness  Endo/Heme/Allergies: Negative for polydipsia.       Negative for polyuria  Negative for hypoglycemia     PHYSICAL EXAM: Blood pressure 124/81, pulse 67, temperature 97.8 F (36.6 C), temperature source Oral, height 5\' 2"  (1.575 m), weight 280 lb (127 kg), SpO2 99 %. Body mass index is 51.21 kg/m. Physical Exam Vitals signs reviewed.  Constitutional:      Appearance: Normal appearance. She is obese.  HENT:     Head: Normocephalic.     Nose: Nose  normal.  Neck:     Musculoskeletal: Normal range of motion.  Cardiovascular:     Rate and Rhythm: Normal rate.  Pulmonary:     Effort: Pulmonary effort is normal.  Musculoskeletal: Normal range of motion.  Skin:    General: Skin is warm and dry.  Neurological:     Mental Status: She is alert and oriented to person, place, and time.  Psychiatric:        Mood and Affect: Mood normal.        Behavior: Behavior normal.     RECENT LABS AND TESTS: BMET    Component Value Date/Time   NA 138 07/17/2018 1313   K 4.2 07/17/2018 1313   CL 99 07/17/2018 1313   CO2 26 07/17/2018 1313   GLUCOSE 85 07/17/2018 1313   GLUCOSE 74 04/03/2016 1017   BUN 10 07/17/2018 1313   CREATININE 0.70 07/17/2018 1313   CREATININE 0.74 04/03/2016 1017   CALCIUM 9.0 07/17/2018 1313   GFRNONAA 104 07/17/2018 1313   GFRAA 119 07/17/2018 1313   Lab Results  Component Value Date   HGBA1C 5.8 (H) 01/06/2019   HGBA1C 5.6 07/17/2018   HGBA1C 5.6 04/26/2018   HGBA1C 6.0 (H) 02/08/2011   HGBA1C 5.5 10/25/2009   Lab Results  Component Value Date   INSULIN 16.6 01/06/2019   INSULIN 7.3 07/17/2018   CBC    Component Value Date/Time   WBC 6.6 07/17/2018  1313   WBC 7.4 06/30/2011 1631   RBC 4.74 07/17/2018 1313   RBC 4.79 06/30/2011 1631   HGB 11.9 07/17/2018 1313   HCT 37.7 07/17/2018 1313   PLT 360 06/30/2011 1631   MCV 80 07/17/2018 1313   MCH 25.1 (L) 07/17/2018 1313   MCH 24.0 (L) 06/30/2011 1631   MCHC 31.6 07/17/2018 1313   MCHC 31.3 06/30/2011 1631   RDW 15.1 07/17/2018 1313   LYMPHSABS 2.0 07/17/2018 1313   MONOABS 0.5 06/30/2011 1631   EOSABS 0.1 07/17/2018 1313   BASOSABS 0.1 07/17/2018 1313   Iron/TIBC/Ferritin/ %Sat    Component Value Date/Time   IRON 41 (L) 12/17/2012 1440   FERRITIN 22.8 12/17/2012 1440   IRONPCTSAT 11.2 (L) 12/17/2012 1440   Lipid Panel     Component Value Date/Time   CHOL 181 07/17/2018 1313   TRIG 44 07/17/2018 1313   HDL 59 07/17/2018 1313   CHOLHDL 3.3 02/08/2011 1035   VLDL 8 02/08/2011 1035   LDLCALC 113 (H) 07/17/2018 1313   Hepatic Function Panel     Component Value Date/Time   PROT 7.0 07/17/2018 1313   ALBUMIN 3.9 07/17/2018 1313   AST 16 07/17/2018 1313   ALT 14 07/17/2018 1313   ALKPHOS 90 07/17/2018 1313   BILITOT 0.2 07/17/2018 1313   BILIDIR 0.1 02/08/2011 1035   IBILI 0.3 02/08/2011 1035      Component Value Date/Time   TSH 0.935 07/17/2018 1313   TSH 1.30 04/03/2016 1017   TSH 1.023 06/30/2011 1631      OBESITY BEHAVIORAL INTERVENTION VISIT  Today's visit was # 10   Starting weight: 281 lbs Starting date: 07/17/18 Today's weight : Weight: 280 lb (127 kg)  Today's date: 02/11/2019 Total lbs lost to date: 1 lb At least 15 minutes were spent on discussing the following behavioral intervention visit.   ASK: We discussed the diagnosis of obesity with Pricilla Loveless today and Tippi agreed to give [place] permission to discuss obesity behavioral modification therapy today.  ASSESS: Glen has the  diagnosis of obesity and her BMI today is 51.2 Emersyn is in the action stage of change   ADVISE: Charrise was educated on the multiple health risks  of obesity as well as the benefit of weight loss to improve her health. She was advised of the need for long term treatment and the importance of lifestyle modifications to improve her current health and to decrease her risk of future health problems.  AGREE: Multiple dietary modification options and treatment options were discussed and  Merikay agreed to follow the recommendations documented in the above note.  ARRANGE: Jenan was educated on the importance of frequent visits to treat obesity as outlined per CMS and USPSTF guidelines and agreed to schedule her next follow up appointment today.  I, Renee Ramus, am acting as transcriptionist for Ilene Qua, MD   I have reviewed the above documentation for accuracy and completeness, and I agree with the above. - Ilene Qua, MD

## 2019-02-24 ENCOUNTER — Encounter (INDEPENDENT_AMBULATORY_CARE_PROVIDER_SITE_OTHER): Payer: Self-pay

## 2019-02-25 ENCOUNTER — Other Ambulatory Visit: Payer: Self-pay

## 2019-02-25 ENCOUNTER — Encounter (INDEPENDENT_AMBULATORY_CARE_PROVIDER_SITE_OTHER): Payer: Self-pay | Admitting: Family Medicine

## 2019-02-25 ENCOUNTER — Ambulatory Visit (INDEPENDENT_AMBULATORY_CARE_PROVIDER_SITE_OTHER): Payer: BC Managed Care – PPO | Admitting: Family Medicine

## 2019-02-25 VITALS — BP 137/83 | HR 68 | Temp 98.2°F | Ht 62.0 in | Wt 281.0 lb

## 2019-02-25 DIAGNOSIS — I10 Essential (primary) hypertension: Secondary | ICD-10-CM

## 2019-02-25 DIAGNOSIS — Z6841 Body Mass Index (BMI) 40.0 and over, adult: Secondary | ICD-10-CM

## 2019-02-25 DIAGNOSIS — R7303 Prediabetes: Secondary | ICD-10-CM

## 2019-02-25 NOTE — Progress Notes (Signed)
Office: [phone]  /  Fax: [phone]   HPI:   Chief Complaint: OBESITY Brooke [name] is here to discuss her progress with her obesity treatment plan. She is on the Category 2 plan and is following her eating plan approximately 50 % of the time. She states she is exercising 0 minutes 0 times per week. [name] voices that she has been eating off the plan, and hasn't been skipping meals. She is still occasionally skipping meals. She has been indulging frequently in sweets. She voices she is still struggling with motivation and discipline.  Her weight is 281 lb (127.5 kg) today and has gained 1 lb since her last visit. She has lost 0 lbs since starting treatment with [place].  Pre-Diabetes Brooke [name] has a diagnosis of pre-diabetes based on her elevated Hgb A1c and was informed this puts her at greater risk of developing diabetes. She is taking metformin currently and notes minimal GI side effects. Mostly she is indulgent eating and still having significant symptoms of carbohydrate cravings. She continues to work on diet and exercise to decrease risk of diabetes.  Hypertension Brooke [name] is a 48 y.o. female with hypertension. [name]'s blood pressure is controlled today. She denies chest pain, chest pressure, or headaches. She is working on weight loss to help control her blood pressure with the goal of decreasing her risk of heart attack and stroke.   ASSESSMENT AND PLAN:  Prediabetes  Essential hypertension  Class 3 severe obesity with serious comorbidity and body mass index (BMI) of 50.0 to 59.9 in adult, unspecified obesity type [facility])  PLAN:  Pre-Diabetes Brooke [name] will continue to work on weight loss, exercise, and decreasing simple carbohydrates in her diet to help decrease the risk of diabetes. We dicussed metformin including benefits and risks. She was informed that eating too many simple carbohydrates or too many calories at one sitting increases the likelihood of GI side  effects. Brooke [name] agrees to continue taking metformin, and she agrees to follow up with our clinic in 2 weeks as directed to monitor her progress.  Hypertension We discussed sodium restriction, working on healthy weight loss, and a regular exercise program as the means to achieve improved blood pressure control. Brooke [name] agreed with this plan and agreed to follow up as directed. We will continue to monitor her blood pressure as well as her progress with the above lifestyle modifications. Brooke [name] agrees to continue taking Bystolic and will watch for signs of hypotension as she continues her lifestyle modifications. Brooke [name] agrees to follow up with our clinic in 2 weeks.  I spent > than 50% of the 15 minute visit on counseling as documented in the note.  Obesity Brooke [name] is currently in the action stage of change. As such, her goal is to continue with weight loss efforts She has agreed to follow the Category 2 plan Brooke [name] has been instructed to work up to a goal of 150 minutes of combined cardio and strengthening exercise per week for weight loss and overall health benefits. We discussed the following Behavioral Modification Strategies today: increasing lean protein intake, increasing vegetables and work on meal planning and easy cooking plans, keeping healthy foods in the home, and planning for success   Brooke [name] has agreed to follow up with our clinic in 2 weeks. She was informed of the importance of frequent follow up visits to maximize her success with intensive lifestyle modifications for her multiple health conditions.  ALLERGIES: No Known Allergies  MEDICATIONS: Current Outpatient Medications on File Prior to Visit  Medication Sig Dispense Refill  . calcium carbonate (OSCAL) 1500 (600 Ca) MG TABS tablet Take 1 tablet by mouth daily.    . hydrochlorothiazide (MICROZIDE) 12.5 MG capsule TAKE ONE CAPSULE BY MOUTH EVERY DAY 90 capsule 1  . lisdexamfetamine (VYVANSE) 40 MG capsule  Take 1 capsule (40 mg total) by mouth every morning. 30 capsule 0  . Magnesium Glycinate POWD 400 mg by Does not apply route daily. 100 g 0  . Melatonin 5 MG TABS Take 10 mg by mouth at bedtime.     . metFORMIN (GLUCOPHAGE) 500 MG tablet Take 1 tablet (500 mg total) by mouth daily with breakfast. 30 tablet 0  . Multiple Vitamin (MULTIVITAMIN) tablet Take 1 tablet by mouth daily.    . nebivolol (BYSTOLIC) 5 MG tablet TAKE 1 TABLET BY MOUTH DAILY 90 tablet 1  . sertraline (ZOLOFT) 50 MG tablet Take 1 tablet (50 mg total) by mouth daily. 90 tablet 1  . valACYclovir (VALTREX) 500 MG tablet Take 500 mg by mouth daily as needed.    Marland Kitchen VITAMIN D, CHOLECALCIFEROL, PO Take 5,000 Units by mouth daily.     No current facility-administered medications on file prior to visit.     PAST MEDICAL HISTORY: Past Medical History:  Diagnosis Date  . ADD (attention deficit disorder)   . Allergy    allergic rhinitis  . Anemia    nos  . Anxiety   . Arthritis    right ankle  . Asthma    as a child  . Back pain   . Constipation   . Depression   . Elevated blood pressure reading without diagnosis of hypertension   . Genital herpes   . Lactose intolerance   . Migraine   . Prediabetes   . Sleep apnea   . Stress   . Swelling     PAST SURGICAL HISTORY: Past Surgical History:  Procedure Laterality Date  . ANKLE SURGERY      SOCIAL HISTORY: Social History   Tobacco Use  . Smoking status: Never Smoker  . Smokeless tobacco: Never Used  Substance Use Topics  . Alcohol use: No  . Drug use: No    FAMILY HISTORY: Family History  Problem Relation Age of Onset  . Hypertension Mother   . Diabetes Mother   . Hyperlipidemia Mother   . Hypertension Father   . Sleep apnea Father   . Alcoholism Father   . Obesity Father   . Hypertension Other   . Diabetes Maternal Aunt   . Diabetes Maternal Uncle   . Heart failure Paternal Aunt   . Diabetes Paternal Aunt   . Heart failure Maternal Grandmother    . Heart failure Maternal Grandfather   . Breast cancer Sister     ROS: Review of Systems  Constitutional: Negative for weight loss.  Cardiovascular: Negative for chest pain.       Negative chest pressure  Neurological: Negative for headaches.    PHYSICAL EXAM: Blood pressure 137/83, pulse 68, temperature 98.2 F (36.8 C), temperature source Oral, height 5\' 2"  (1.575 m), weight 281 lb (127.5 kg), SpO2 98 %. Body mass index is 51.4 kg/m. Physical Exam Vitals signs reviewed.  Constitutional:      Appearance: Normal appearance. She is obese.  Cardiovascular:     Rate and Rhythm: Normal rate.     Pulses: Normal pulses.  Pulmonary:     Effort: Pulmonary effort is normal.     Breath sounds: Normal breath sounds.  Musculoskeletal:  Normal range of motion.  Skin:    General: Skin is warm and dry.  Neurological:     Mental Status: She is alert and oriented to person, place, and time.  Psychiatric:        Mood and Affect: Mood normal.        Behavior: Behavior normal.     RECENT LABS AND TESTS: BMET    Component Value Date/Time   NA 138 07/17/2018 1313   K 4.2 07/17/2018 1313   CL 99 07/17/2018 1313   CO2 26 07/17/2018 1313   GLUCOSE 85 07/17/2018 1313   GLUCOSE 74 04/03/2016 1017   BUN 10 07/17/2018 1313   CREATININE 0.70 07/17/2018 1313   CREATININE 0.74 04/03/2016 1017   CALCIUM 9.0 07/17/2018 1313   GFRNONAA 104 07/17/2018 1313   GFRAA 119 07/17/2018 1313   Lab Results  Component Value Date   HGBA1C 5.8 (H) 01/06/2019   HGBA1C 5.6 07/17/2018   HGBA1C 5.6 04/26/2018   HGBA1C 6.0 (H) 02/08/2011   HGBA1C 5.5 10/25/2009   Lab Results  Component Value Date   INSULIN 16.6 01/06/2019   INSULIN 7.3 07/17/2018   CBC    Component Value Date/Time   WBC 6.6 07/17/2018 1313   WBC 7.4 06/30/2011 1631   RBC 4.74 07/17/2018 1313   RBC 4.79 06/30/2011 1631   HGB 11.9 07/17/2018 1313   HCT 37.7 07/17/2018 1313   PLT 360 06/30/2011 1631   MCV 80 07/17/2018 1313    MCH 25.1 (L) 07/17/2018 1313   MCH 24.0 (L) 06/30/2011 1631   MCHC 31.6 07/17/2018 1313   MCHC 31.3 06/30/2011 1631   RDW 15.1 07/17/2018 1313   LYMPHSABS 2.0 07/17/2018 1313   MONOABS 0.5 06/30/2011 1631   EOSABS 0.1 07/17/2018 1313   BASOSABS 0.1 07/17/2018 1313   Iron/TIBC/Ferritin/ %Sat    Component Value Date/Time   IRON 41 (L) 12/17/2012 1440   FERRITIN 22.8 12/17/2012 1440   IRONPCTSAT 11.2 (L) 12/17/2012 1440   Lipid Panel     Component Value Date/Time   CHOL 181 07/17/2018 1313   TRIG 44 07/17/2018 1313   HDL 59 07/17/2018 1313   CHOLHDL 3.3 02/08/2011 1035   VLDL 8 02/08/2011 1035   LDLCALC 113 (H) 07/17/2018 1313   Hepatic Function Panel     Component Value Date/Time   PROT 7.0 07/17/2018 1313   ALBUMIN 3.9 07/17/2018 1313   AST 16 07/17/2018 1313   ALT 14 07/17/2018 1313   ALKPHOS 90 07/17/2018 1313   BILITOT 0.2 07/17/2018 1313   BILIDIR 0.1 02/08/2011 1035   IBILI 0.3 02/08/2011 1035      Component Value Date/Time   TSH 0.935 07/17/2018 1313   TSH 1.30 04/03/2016 1017   TSH 1.023 06/30/2011 1631      OBESITY BEHAVIORAL INTERVENTION VISIT  Today's visit was # 11   Starting weight: 281 lbs Starting date: 07/17/2018 Today's weight : 281 lbs Today's date: 02/25/2019 Total lbs lost to date: 0    ASK: We discussed the diagnosis of obesity with Pricilla Loveless today and Shequila agreed to give [place] permission to discuss obesity behavioral modification therapy today.  ASSESS: Jahzeel has the diagnosis of obesity and her BMI today is 51.38 Launie is in the action stage of change   ADVISE: Delilah was educated on the multiple health risks of obesity as well as the benefit of weight loss to improve her health. She was advised of the need for long term treatment and  the importance of lifestyle modifications to improve her current health and to decrease her risk of future health problems.  AGREE: Multiple dietary modification options and  treatment options were discussed and  Tangy agreed to follow the recommendations documented in the above note.  ARRANGE: Shuntell was educated on the importance of frequent visits to treat obesity as outlined per CMS and USPSTF guidelines and agreed to schedule her next follow up appointment today.  I, Trixie Dredge, am acting as transcriptionist for Ilene Qua, MD  I have reviewed the above documentation for accuracy and completeness, and I agree with the above. - Ilene Qua, MD

## 2019-03-07 ENCOUNTER — Encounter: Payer: Self-pay | Admitting: Internal Medicine

## 2019-03-10 ENCOUNTER — Ambulatory Visit: Payer: BC Managed Care – PPO | Admitting: Nurse Practitioner

## 2019-03-10 ENCOUNTER — Encounter: Payer: Self-pay | Admitting: Nurse Practitioner

## 2019-03-10 ENCOUNTER — Other Ambulatory Visit: Payer: Self-pay

## 2019-03-10 ENCOUNTER — Ambulatory Visit
Admission: RE | Admit: 2019-03-10 | Discharge: 2019-03-10 | Disposition: A | Discharge status: Home / Self Care | Payer: BC Managed Care – PPO | Source: Ambulatory Visit | Attending: Nurse Practitioner | Admitting: Nurse Practitioner

## 2019-03-10 VITALS — BP 110/80 | HR 67 | Temp 98.1°F | Ht 62.4 in | Wt 285.4 lb

## 2019-03-10 DIAGNOSIS — R7309 Other abnormal glucose: Secondary | ICD-10-CM

## 2019-03-10 DIAGNOSIS — R0600 Dyspnea, unspecified: Secondary | ICD-10-CM

## 2019-03-10 DIAGNOSIS — I10 Essential (primary) hypertension: Secondary | ICD-10-CM | POA: Diagnosis not present

## 2019-03-10 DIAGNOSIS — Z6841 Body Mass Index (BMI) 40.0 and over, adult: Secondary | ICD-10-CM

## 2019-03-10 DIAGNOSIS — Z23 Encounter for immunization: Secondary | ICD-10-CM

## 2019-03-10 DIAGNOSIS — Z139 Encounter for screening, unspecified: Secondary | ICD-10-CM

## 2019-03-10 DIAGNOSIS — F988 Other specified behavioral and emotional disorders with onset usually occurring in childhood and adolescence: Secondary | ICD-10-CM | POA: Diagnosis not present

## 2019-03-10 DIAGNOSIS — Z0000 Encounter for general adult medical examination without abnormal findings: Secondary | ICD-10-CM

## 2019-03-10 DIAGNOSIS — Z79899 Other long term (current) drug therapy: Secondary | ICD-10-CM

## 2019-03-10 DIAGNOSIS — E559 Vitamin D deficiency, unspecified: Secondary | ICD-10-CM

## 2019-03-10 LAB — POCT URINALYSIS DIPSTICK
Bilirubin, UA: NEGATIVE
Glucose, UA: NEGATIVE
Ketones, UA: NEGATIVE
Leukocytes, UA: NEGATIVE
Nitrite, UA: NEGATIVE
Protein, UA: NEGATIVE
Spec Grav, UA: 1.02 (ref 1.010–1.025)
Urobilinogen, UA: 0.2 E.U./dL
pH, UA: 7 (ref 5.0–8.0)

## 2019-03-10 LAB — POCT UA - MICROALBUMIN
Albumin/Creatinine Ratio, Urine, POC: <30
Creatinine, POC: 200 mg/dL
Microalbumin Ur, POC: 30 mg/L

## 2019-03-10 MED ORDER — LISDEXAMFETAMINE DIMESYLATE 40 MG PO CAPS: 40.0000 mg | ORAL_CAPSULE | ORAL | 0 refills | Status: DC

## 2019-03-10 MED ORDER — ALBUTEROL SULFATE HFA 108 (90 BASE) MCG/ACT IN AERS: 2.0000 | INHALATION_SPRAY | Freq: Four times a day (QID) | RESPIRATORY_TRACT | 1 refills | Status: DC | PRN

## 2019-03-10 NOTE — Patient Instructions (Signed)
Health Maintenance  Topic Date Due  . HIV Screening  07/[birth year]/1985  . PAP SMEAR-Modifier  04/22/2021  . TETANUS/TDAP  06/13/2026  . INFLUENZA VACCINE  Completed   Health Maintenance, Female Adopting a healthy lifestyle and getting preventive care are important in promoting health and wellness. Ask your health care provider about:  The right schedule for you to have regular tests and exams.  Things you can do on your own to prevent diseases and keep yourself healthy. What should I know about diet, weight, and exercise? Eat a healthy diet   Eat a diet that includes plenty of vegetables, fruits, low-fat dairy products, and lean protein.  Do not eat a lot of foods that are high in solid fats, added sugars, or sodium. Maintain a healthy weight Body mass index (BMI) is used to identify weight problems. It estimates body fat based on height and weight. Your health care provider can help determine your BMI and help you achieve or maintain a healthy weight. Get regular exercise Get regular exercise. This is one of the most important things you can do for your health. Most adults should:  Exercise for at least 150 minutes each week. The exercise should increase your heart rate and make you sweat (moderate-intensity exercise).  Do strengthening exercises at least twice a week. This is in addition to the moderate-intensity exercise.  Spend less time sitting. Even light physical activity can be beneficial. Watch cholesterol and blood lipids Have your blood tested for lipids and cholesterol at 48 years of age, then have this test every 5 years. Have your cholesterol levels checked more often if:  Your lipid or cholesterol levels are high.  You are older than 48 years of age.  You are at high risk for heart disease. What should I know about cancer screening? Depending on your health history and family history, you may need to have cancer screening at various ages. This may include screening  for:  Breast cancer.  Cervical cancer.  Colorectal cancer.  Skin cancer.  Lung cancer. What should I know about heart disease, diabetes, and high blood pressure? Blood pressure and heart disease  High blood pressure causes heart disease and increases the risk of stroke. This is more likely to develop in people who have high blood pressure readings, are of African descent, or are overweight.  Have your blood pressure checked: ? Every 3-5 years if you are 59-91 years of age. ? Every year if you are 30 years old or older. Diabetes Have regular diabetes screenings. This checks your fasting blood sugar level. Have the screening done:  Once every three years after age 44 if you are at a normal weight and have a low risk for diabetes.  More often and at a younger age if you are overweight or have a high risk for diabetes. What should I know about preventing infection? Hepatitis B If you have a higher risk for hepatitis B, you should be screened for this virus. Talk with your health care provider to find out if you are at risk for hepatitis B infection. Hepatitis C Testing is recommended for:  Everyone born from [birth year] through 1965.  Anyone with known risk factors for hepatitis C. Sexually transmitted infections (STIs)  Get screened for STIs, including gonorrhea and chlamydia, if: ? You are sexually active and are younger than 48 years of age. ? You are older than 48 years of age and your health care provider tells you that you are at risk for  this type of infection. ? Your sexual activity has changed since you were last screened, and you are at increased risk for chlamydia or gonorrhea. Ask your health care provider if you are at risk.  Ask your health care provider about whether you are at high risk for HIV. Your health care provider may recommend a prescription medicine to help prevent HIV infection. If you choose to take medicine to prevent HIV, you should first get tested for HIV.  You should then be tested every 3 months for as long as you are taking the medicine. Pregnancy  If you are about to stop having your period (premenopausal) and you may become pregnant, seek counseling before you get pregnant.  Take 400 to 800 micrograms (mcg) of folic acid every day if you become pregnant.  Ask for birth control (contraception) if you want to prevent pregnancy. Osteoporosis and menopause Osteoporosis is a disease in which the bones lose minerals and strength with aging. This can result in bone fractures. If you are 46 years old or older, or if you are at risk for osteoporosis and fractures, ask your health care provider if you should:  Be screened for bone loss.  Take a calcium or vitamin D supplement to lower your risk of fractures.  Be given hormone replacement therapy (HRT) to treat symptoms of menopause. Follow these instructions at home: Lifestyle  Do not use any products that contain nicotine or tobacco, such as cigarettes, e-cigarettes, and chewing tobacco. If you need help quitting, ask your health care provider.  Do not use street drugs.  Do not share needles.  Ask your health care provider for help if you need support or information about quitting drugs. Alcohol use  Do not drink alcohol if: ? Your health care provider tells you not to drink. ? You are pregnant, may be pregnant, or are planning to become pregnant.  If you drink alcohol: ? Limit how much you use to 0-1 drink a day. ? Limit intake if you are breastfeeding.  Be aware of how much alcohol is in your drink. In the [place] one drink equals one 12 oz bottle of beer (355 mL), one 5 oz glass of wine (148 mL), or one 1 oz glass of hard liquor (44 mL). General instructions  Schedule regular health, dental, and eye exams.  Stay current with your vaccines.  Tell your health care provider if: ? You often feel depressed. ? You have ever been abused or do not feel safe at home. Summary   Adopting a healthy lifestyle and getting preventive care are important in promoting health and wellness.  Follow your health care provider's instructions about healthy diet, exercising, and getting tested or screened for diseases.  Follow your health care provider's instructions on monitoring your cholesterol and blood pressure. This information is not intended to replace advice given to you by your health care provider. Make sure you discuss any questions you have with your health care provider. Document Released: 12/26/2010 Document Revised: 06/05/2018 Document Reviewed: 06/05/2018 Elsevier Patient Education  2020 [employer].

## 2019-03-10 NOTE — Progress Notes (Signed)
Subjective:     Patient ID: Brooke [name] , female    DOB: 1971-04-13 , 48 y.o.   MRN: [id number]   Chief Complaint  Patient presents with  . Annual Exam  . MED CHECK    HPI  Here for HM and ADD follow up  She is seeing Dr. Jeanne Ivan - right before covid she has 13 lbs.  She was started on Metformin to help with weight loss.  She is at [employer].  She has challenges with eating regularly.  Have not developed the right habits.  She feels her clothes are tighter.  She does have a CPAP that she does not use due to causing her discomfort.    She has been Vyvanse for years.  She has had a decrease in her medications due to elevated blood pressure.    Shortness of Breath This is a chronic problem. The current episode started more than 1 month ago. The problem occurs intermittently (Feels like she is unable to take a full breath ). Pertinent negatives include no chest pain, fever or headaches. Nothing aggravates the symptoms. There is no history of allergies.   The patient states she uses Mirena for birth control. Last LMP was Patient's last menstrual period was 02/18/2019.. Negative for Dysmenorrhea and Negative for Menorrhagia Mammogram last done 02/11/2019.  Negative for: breast discharge, breast lump(s), breast pain and breast self exam.  Pertinent negatives include abnormal bleeding (hematology), anxiety, decreased libido, depression, difficulty falling sleep, dyspareunia, history of infertility, nocturia, sexual dysfunction, sleep disturbances, urinary incontinence, urinary urgency, vaginal discharge and vaginal itching. Diet regular.The patient states her exercise level is  minimal.      The patient's tobacco use is:  Social History   Tobacco Use  Smoking Status Never Smoker  Smokeless Tobacco Never Used   She has been exposed to passive smoke. The patient's alcohol use is:  Social History   Substance and Sexual Activity  Alcohol Use No   Additional information: Last  pap done by Dr. Suzzette Righter is due this year in December.     Past Medical History:  Diagnosis Date  . ADD (attention deficit disorder)   . Allergy    allergic rhinitis  . Anemia    nos  . Anxiety   . Arthritis    right ankle  . Asthma    as a child  . Back pain   . Constipation   . Depression   . Elevated blood pressure reading without diagnosis of hypertension   . Genital herpes   . Lactose intolerance   . Migraine   . Prediabetes   . Sleep apnea   . Stress   . Swelling      Family History  Problem Relation Age of Onset  . Hypertension Mother   . Diabetes Mother   . Hyperlipidemia Mother   . Hypertension Father   . Sleep apnea Father   . Alcoholism Father   . Obesity Father   . Hypertension Other   . Diabetes Maternal Aunt   . Diabetes Maternal Uncle   . Heart failure Paternal Aunt   . Diabetes Paternal Aunt   . Heart failure Maternal Grandmother   . Heart failure Maternal Grandfather   . Breast cancer Sister      Current Outpatient Medications:  .  calcium carbonate (OSCAL) 1500 (600 Ca) MG TABS tablet, Take 1 tablet by mouth daily., Disp: , Rfl:  .  hydrochlorothiazide (MICROZIDE) 12.5 MG capsule, TAKE ONE CAPSULE  BY MOUTH EVERY DAY, Disp: 90 capsule, Rfl: 1 .  lisdexamfetamine (VYVANSE) 40 MG capsule, Take 1 capsule (40 mg total) by mouth every morning., Disp: 30 capsule, Rfl: 0 .  Magnesium Glycinate POWD, 400 mg by Does not apply route daily., Disp: 100 g, Rfl: 0 .  Melatonin 5 MG TABS, Take 10 mg by mouth at bedtime. , Disp: , Rfl:  .  metFORMIN (GLUCOPHAGE) 500 MG tablet, Take 1 tablet (500 mg total) by mouth daily with breakfast., Disp: 30 tablet, Rfl: 0 .  Multiple Vitamin (MULTIVITAMIN) tablet, Take 1 tablet by mouth daily., Disp: , Rfl:  .  nebivolol (BYSTOLIC) 5 MG tablet, TAKE 1 TABLET BY MOUTH DAILY, Disp: 90 tablet, Rfl: 1 .  sertraline (ZOLOFT) 50 MG tablet, Take 1 tablet (50 mg total) by mouth daily., Disp: 90 tablet, Rfl: 1 .  valACYclovir  (VALTREX) 500 MG tablet, Take 500 mg by mouth daily as needed., Disp: , Rfl:  .  VITAMIN D, CHOLECALCIFEROL, PO, Take 5,000 Units by mouth daily., Disp: , Rfl:    No Known Allergies   Review of Systems  Constitutional: Negative.  Negative for fever.  HENT: Negative.   Eyes: Negative.   Respiratory: Positive for shortness of breath.   Cardiovascular: Negative.  Negative for chest pain.  Gastrointestinal: Negative.   Endocrine: Negative.   Genitourinary: Negative.   Musculoskeletal: Negative.   Skin: Negative.   Allergic/Immunologic: Negative.   Neurological: Negative for dizziness and headaches.  Hematological: Negative.   Psychiatric/Behavioral: Negative.      Today's Vitals   03/10/19 0920  BP: 110/80  Pulse: 67  Temp: 98.1 F (36.7 C)  TempSrc: Oral  Weight: 285 lb 6.4 oz (129.5 kg)  Height: 5' 2.4" (1.585 m)  PainSc: 0-No pain   Body mass index is 51.53 kg/m.   Objective:  Physical Exam Vitals signs reviewed.  Constitutional:      Appearance: Normal appearance. She is well-developed.  HENT:     Head: Normocephalic and atraumatic.     Right Ear: Hearing, tympanic membrane, ear canal and external ear normal.     Left Ear: Hearing, tympanic membrane, ear canal and external ear normal.     Nose: Nose normal.     Mouth/Throat:     Mouth: Mucous membranes are moist.  Eyes:     General: Lids are normal.     Conjunctiva/sclera: Conjunctivae normal.     Pupils: Pupils are equal, round, and reactive to light.     Funduscopic exam:    Right eye: No papilledema.        Left eye: No papilledema.  Neck:     Musculoskeletal: Full passive range of motion without pain, normal range of motion and neck supple.     Thyroid: No thyroid mass.     Vascular: No carotid bruit.  Cardiovascular:     Rate and Rhythm: Normal rate and regular rhythm.     Pulses: Normal pulses.     Heart sounds: Normal heart sounds. No murmur.  Pulmonary:     Effort: Pulmonary effort is normal.      Breath sounds: Normal breath sounds.  Chest:     Comments: Breast exam deferred had Mammogram in August 2020 Abdominal:     General: Abdomen is flat. Bowel sounds are normal.     Palpations: Abdomen is soft.  Musculoskeletal: Normal range of motion.        General: No swelling or tenderness.     Right lower leg:  No edema.     Left lower leg: No edema.  Skin:    General: Skin is warm and dry.     Capillary Refill: Capillary refill takes less than 2 seconds.  Neurological:     General: No focal deficit present.     Mental Status: She is alert and oriented to person, place, and time.     Cranial Nerves: No cranial nerve deficit.     Sensory: No sensory deficit.  Psychiatric:        Mood and Affect: Mood normal.        Behavior: Behavior normal.        Thought Content: Thought content normal.        Judgment: Judgment normal.         Assessment And Plan:     1. Health care maintenance . Behavior modifications discussed and diet history reviewed.   . Pt will continue to exercise regularly and modify diet with low GI, plant based foods and decrease intake of processed foods.  . Recommend intake of daily multivitamin, Vitamin D, and calcium.  . Recommend mammogram (done in August) for preventive screenings, as well as recommend immunizations that include influenza, TDAP - CBC no Diff  2. 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 Class 3 severe obesity due to excess calories with serious comorbidity and body mass index (BMI) of 50.0 to 59.9 in adult [facility])  Chronic  Discussed healthy diet and regular exercise options   Encouraged to exercise at least 150 minutes per week with 2 days of strength training  Discussed different  ways to increase physical activity and to increase her interest to exercise more  5. Essential hypertension, benign  Chronic  Good control   Continue with current medications - CMP14 + Anion Gap  6. Abnormal glucose  Chronic, controlled  Continue with current medications (metformin) - Hemoglobin A1c  7. Vitamin D deficiency  Will check vitamin D level and supplement as needed.     Also encouraged to spend 15 minutes in the sun daily.  - Vitamin D (25 hydroxy)  8. Encounter for screening  - HIV antibody (with reflex)  9. Dyspnea, unspecified type  This is intermittent and mostly with exertion  Likely related to weight vs deconditioning vs reactive airway  She has been using an albuterol inhaler with good results   Minette Brine, FNP    THE PATIENT IS ENCOURAGED TO PRACTICE SOCIAL DISTANCING DUE TO THE COVID-19 PANDEMIC.

## 2019-03-11 ENCOUNTER — Other Ambulatory Visit: Payer: Self-pay

## 2019-03-11 ENCOUNTER — Ambulatory Visit (INDEPENDENT_AMBULATORY_CARE_PROVIDER_SITE_OTHER): Payer: BC Managed Care – PPO | Admitting: Family Medicine

## 2019-03-11 ENCOUNTER — Encounter (INDEPENDENT_AMBULATORY_CARE_PROVIDER_SITE_OTHER): Payer: Self-pay | Admitting: Family Medicine

## 2019-03-11 VITALS — BP 139/87 | HR 71 | Temp 98.0°F | Ht 62.0 in | Wt 281.0 lb

## 2019-03-11 DIAGNOSIS — I10 Essential (primary) hypertension: Secondary | ICD-10-CM

## 2019-03-11 DIAGNOSIS — Z9189 Other specified personal risk factors, not elsewhere classified: Secondary | ICD-10-CM | POA: Diagnosis not present

## 2019-03-11 DIAGNOSIS — R7303 Prediabetes: Secondary | ICD-10-CM

## 2019-03-11 DIAGNOSIS — Z6841 Body Mass Index (BMI) 40.0 and over, adult: Secondary | ICD-10-CM

## 2019-03-11 LAB — CMP14 + ANION GAP
ALT: 15 IU/L (ref 0–32)
AST: 16 IU/L (ref 0–40)
Albumin/Globulin Ratio: 1.2 (ref 1.2–2.2)
Albumin: 4 g/dL (ref 3.8–4.8)
Alkaline Phosphatase: 114 IU/L (ref 39–117)
Anion Gap: 12 mmol/L (ref 10.0–18.0)
BUN/Creatinine Ratio: 14 (ref 9–23)
BUN: 11 mg/dL (ref 6–24)
Bilirubin Total: 0.3 mg/dL (ref 0.0–1.2)
CO2: 26 mmol/L (ref 20–29)
Calcium: 10.1 mg/dL (ref 8.7–10.2)
Chloride: 98 mmol/L (ref 96–106)
Creatinine, Ser: 0.76 mg/dL (ref 0.57–1.00)
GFR calc Af Amer: 107 mL/min/{1.73_m2} (ref >59)
GFR calc non Af Amer: 93 mL/min/{1.73_m2} (ref >59)
Globulin, Total: 3.4 g/dL (ref 1.5–4.5)
Glucose: 83 mg/dL (ref 65–99)
Potassium: 4.2 mmol/L (ref 3.5–5.2)
Sodium: 136 mmol/L (ref 134–144)
Total Protein: 7.4 g/dL (ref 6.0–8.5)

## 2019-03-11 LAB — DRUG SCREEN, URINE
Amphetamines, Urine: POSITIVE ng/mL — AB
Barbiturate screen, urine: NEGATIVE ng/mL
Benzodiazepine Quant, Ur: NEGATIVE ng/mL
Cannabinoid Quant, Ur: NEGATIVE ng/mL
Cocaine (Metab.): NEGATIVE ng/mL
Opiate Quant, Ur: NEGATIVE ng/mL
PCP Quant, Ur: NEGATIVE ng/mL

## 2019-03-11 LAB — HEMOGLOBIN A1C
Est. average glucose Bld gHb Est-mCnc: 117 mg/dL
Hgb A1c MFr Bld: 5.7 % — ABNORMAL HIGH (ref 4.8–5.6)

## 2019-03-11 LAB — CBC
Hematocrit: 39.1 % (ref 34.0–46.6)
Hemoglobin: 12.5 g/dL (ref 11.1–15.9)
MCH: 25.2 pg — ABNORMAL LOW (ref 26.6–33.0)
MCHC: 32 g/dL (ref 31.5–35.7)
MCV: 79 fL (ref 79–97)
Platelets: 386 10*3/uL (ref 150–450)
RBC: 4.96 x10E6/uL (ref 3.77–5.28)
RDW: 15.1 % (ref 11.7–15.4)
WBC: 8.3 10*3/uL (ref 3.4–10.8)

## 2019-03-11 LAB — HIV ANTIBODY (ROUTINE TESTING W REFLEX): HIV Screen 4th Generation wRfx: NONREACTIVE

## 2019-03-11 MED ORDER — METFORMIN HCL 500 MG PO TABS: 500.0000 mg | ORAL_TABLET | Freq: Every day | ORAL | 0 refills | Status: DC

## 2019-03-12 NOTE — Progress Notes (Signed)
Office: [phone]  /  Fax: [phone]   HPI:   Chief Complaint: OBESITY Brooke [name] is here to discuss her progress with her obesity treatment plan. She is on the Category 2 plan and is following her eating plan approximately 60 % of the time. She states she is exercising 0 minutes 0 times per week. Tai voices the last few weeks have been better. She knows she can do better in terms of getting in protein and make better choices. She had a good Labor Day weekend. She feels like she makes good choices day to day in terms of food. Her weight is 281 lb (127.5 kg) today and has not lost weight since her last visit. She has lost 0 lbs since starting treatment with [place].  Pre-Diabetes Brooke [name] has a diagnosis of pre-diabetes based on her elevated Hgb A1c and was informed this puts her at greater risk of developing diabetes. She denies GI side effects of metformin, and some fluid shifts were noted today. She continues to work on diet and exercise to decrease risk of diabetes.   At risk for diabetes Brooke [name] is at higher than average risk for developing diabetes due to her obesity and pre-diabetes. She currently denies polyuria or polydipsia.  Hypertension Brooke [name] is a 48 y.o. female with hypertension. Hendrix's blood pressure is controlled today. She denies chest pain, chest pressure, or headaches. She is working on weight loss to help control her blood pressure with the goal of decreasing her risk of heart attack and stroke.   ASSESSMENT AND PLAN:  Prediabetes - Plan: metFORMIN (GLUCOPHAGE) 500 MG tablet, metFORMIN (GLUCOPHAGE) 500 MG tablet  Essential hypertension  At risk for diabetes mellitus  Class 3 severe obesity with serious comorbidity and body mass index (BMI) of 50.0 to 59.9 in adult, unspecified obesity type [facility])  PLAN:  Pre-Diabetes Brooke [name] will continue to work on weight loss, exercise, and decreasing simple carbohydrates in her diet to help decrease  the risk of diabetes. We dicussed metformin including benefits and risks. She was informed that eating too many simple carbohydrates or too many calories at one sitting increases the likelihood of GI side effects. Brooke [name] agrees to continue taking metformin 500 mg PO q AM #30 and we will refill for 1 month. Heela agrees to follow up with our clinic in 2 weeks as directed to monitor her progress.  Diabetes risk counseling Brooke [name] was given extended (15 minutes) diabetes prevention counseling today. She is 48 y.o. female and has risk factors for diabetes including obesity and pre-diabetes. We discussed intensive lifestyle modifications today with an emphasis on weight loss as well as increasing exercise and decreasing simple carbohydrates in her diet.  Hypertension We discussed sodium restriction, working on healthy weight loss, and a regular exercise program as the means to achieve improved blood pressure control. Brooke [name] agreed with this plan and agreed to follow up as directed. We will continue to monitor her blood pressure as well as her progress with the above lifestyle modifications. Brooke [name] agrees to continue her current medications, no change in medications. She will watch for signs of hypotension as she continues her lifestyle modifications. Brooke [name] agrees to follow up with our clinic in 2 weeks.  Obesity Brooke [name] is currently in the action stage of change. As such, her goal is to continue with weight loss efforts She has agreed to follow the Category 2 plan Brooke [name] has been instructed to work up to a goal of 150 minutes of combined cardio and strengthening exercise  per week for weight loss and overall health benefits. We discussed the following Behavioral Modification Strategies today: increasing lean protein intake, increasing vegetables and work on meal planning and easy cooking plans, keeping healthy foods in the home, better snacking choices, and planning for success    Brooke [name] has agreed to follow up with our clinic in 2 weeks. She was informed of the importance of frequent follow up visits to maximize her success with intensive lifestyle modifications for her multiple health conditions.  ALLERGIES: No Known Allergies  MEDICATIONS: Current Outpatient Medications on File Prior to Visit  Medication Sig Dispense Refill  . albuterol (VENTOLIN HFA) 108 (90 Base) MCG/ACT inhaler Inhale 2 puffs into the lungs every 6 (six) hours as needed for wheezing or shortness of breath. 18 g 1  . calcium carbonate (OSCAL) 1500 (600 Ca) MG TABS tablet Take 1 tablet by mouth daily.    . hydrochlorothiazide (MICROZIDE) 12.5 MG capsule TAKE ONE CAPSULE BY MOUTH EVERY DAY 90 capsule 1  . lisdexamfetamine (VYVANSE) 40 MG capsule Take 1 capsule (40 mg total) by mouth every morning. 30 capsule 0  . Magnesium Glycinate POWD 400 mg by Does not apply route daily. 100 g 0  . Melatonin 5 MG TABS Take 10 mg by mouth at bedtime.     . Multiple Vitamin (MULTIVITAMIN) tablet Take 1 tablet by mouth daily.    . nebivolol (BYSTOLIC) 5 MG tablet TAKE 1 TABLET BY MOUTH DAILY 90 tablet 1  . sertraline (ZOLOFT) 50 MG tablet Take 1 tablet (50 mg total) by mouth daily. 90 tablet 1  . valACYclovir (VALTREX) 500 MG tablet Take 500 mg by mouth daily as needed.    Brooke [name] VITAMIN D, CHOLECALCIFEROL, PO Take 5,000 Units by mouth daily.     No current facility-administered medications on file prior to visit.     PAST MEDICAL HISTORY: Past Medical History:  Diagnosis Date  . ADD (attention deficit disorder)   . Allergy    allergic rhinitis  . Anemia    nos  . Anxiety   . Arthritis    right ankle  . Asthma    as a child  . Back pain   . Constipation   . Depression   . Elevated blood pressure reading without diagnosis of hypertension   . Genital herpes   . Lactose intolerance   . Migraine   . Prediabetes   . Sleep apnea   . Stress   . Swelling     PAST SURGICAL HISTORY: Past Surgical  History:  Procedure Laterality Date  . ANKLE SURGERY      SOCIAL HISTORY: Social History   Tobacco Use  . Smoking status: Never Smoker  . Smokeless tobacco: Never Used  Substance Use Topics  . Alcohol use: No  . Drug use: No    FAMILY HISTORY: Family History  Problem Relation Age of Onset  . Hypertension Mother   . Diabetes Mother   . Hyperlipidemia Mother   . Hypertension Father   . Sleep apnea Father   . Alcoholism Father   . Obesity Father   . Hypertension Other   . Diabetes Maternal Aunt   . Diabetes Maternal Uncle   . Heart failure Paternal Aunt   . Diabetes Paternal Aunt   . Heart failure Maternal Grandmother   . Heart failure Maternal Grandfather   . Breast cancer Sister     ROS: Review of Systems  Constitutional: Negative for weight loss.  Cardiovascular: Negative for chest pain.  Negative chest pressure  Genitourinary: Negative for frequency.  Neurological: Negative for headaches.  Endo/Heme/Allergies: Negative for polydipsia.    PHYSICAL EXAM: Blood pressure 139/87, pulse 71, temperature 98 F (36.7 C), temperature source Oral, height 5\' 2"  (1.575 m), weight 281 lb (127.5 kg), last menstrual period 02/18/2019, SpO2 97 %. Body mass index is 51.4 kg/m. Physical Exam Vitals signs reviewed.  Constitutional:      Appearance: Normal appearance. She is obese.  Cardiovascular:     Rate and Rhythm: Normal rate.     Pulses: Normal pulses.  Pulmonary:     Effort: Pulmonary effort is normal.     Breath sounds: Normal breath sounds.  Musculoskeletal: Normal range of motion.  Skin:    General: Skin is warm and dry.  Neurological:     Mental Status: She is alert and oriented to person, place, and time.  Psychiatric:        Mood and Affect: Mood normal.        Behavior: Behavior normal.     RECENT LABS AND TESTS: BMET    Component Value Date/Time   NA 136 03/10/2019 1032   K 4.2 03/10/2019 1032   CL 98 03/10/2019 1032   CO2 26 03/10/2019  1032   GLUCOSE 83 03/10/2019 1032   GLUCOSE 74 04/03/2016 1017   BUN 11 03/10/2019 1032   CREATININE 0.76 03/10/2019 1032   CREATININE 0.74 04/03/2016 1017   CALCIUM 10.1 03/10/2019 1032   GFRNONAA 93 03/10/2019 1032   GFRAA 107 03/10/2019 1032   Lab Results  Component Value Date   HGBA1C 5.7 (H) 03/10/2019   HGBA1C 5.8 (H) 01/06/2019   HGBA1C 5.6 07/17/2018   HGBA1C 5.6 04/26/2018   HGBA1C 6.0 (H) 02/08/2011   Lab Results  Component Value Date   INSULIN 16.6 01/06/2019   INSULIN 7.3 07/17/2018   CBC    Component Value Date/Time   WBC 8.3 03/10/2019 1032   WBC 7.4 06/30/2011 1631   RBC 4.96 03/10/2019 1032   RBC 4.79 06/30/2011 1631   HGB 12.5 03/10/2019 1032   HCT 39.1 03/10/2019 1032   PLT 386 03/10/2019 1032   MCV 79 03/10/2019 1032   MCH 25.2 (L) 03/10/2019 1032   MCH 24.0 (L) 06/30/2011 1631   MCHC 32.0 03/10/2019 1032   MCHC 31.3 06/30/2011 1631   RDW 15.1 03/10/2019 1032   LYMPHSABS 2.0 07/17/2018 1313   MONOABS 0.5 06/30/2011 1631   EOSABS 0.1 07/17/2018 1313   BASOSABS 0.1 07/17/2018 1313   Iron/TIBC/Ferritin/ %Sat    Component Value Date/Time   IRON 41 (L) 12/17/2012 1440   FERRITIN 22.8 12/17/2012 1440   IRONPCTSAT 11.2 (L) 12/17/2012 1440   Lipid Panel     Component Value Date/Time   CHOL 181 07/17/2018 1313   TRIG 44 07/17/2018 1313   HDL 59 07/17/2018 1313   CHOLHDL 3.3 02/08/2011 1035   VLDL 8 02/08/2011 1035   LDLCALC 113 (H) 07/17/2018 1313   Hepatic Function Panel     Component Value Date/Time   PROT 7.4 03/10/2019 1032   ALBUMIN 4.0 03/10/2019 1032   AST 16 03/10/2019 1032   ALT 15 03/10/2019 1032   ALKPHOS 114 03/10/2019 1032   BILITOT 0.3 03/10/2019 1032   BILIDIR 0.1 02/08/2011 1035   IBILI 0.3 02/08/2011 1035      Component Value Date/Time   TSH 0.935 07/17/2018 1313   TSH 1.30 04/03/2016 1017   TSH 1.023 06/30/2011 1631      OBESITY BEHAVIORAL  INTERVENTION VISIT  Today's visit was # 12   Starting weight: 281  lbs Starting date: 07/17/2018 Today's weight : 281 lbs Today's date: 03/11/2019 Total lbs lost to date: 0    ASK: We discussed the diagnosis of obesity with Pricilla Loveless today and Alberteen agreed to give [place] permission to discuss obesity behavioral modification therapy today.  ASSESS: Siddalee has the diagnosis of obesity and her BMI today is 51.38 Kerstie is in the action stage of change   ADVISE: Raquel was educated on the multiple health risks of obesity as well as the benefit of weight loss to improve her health. She was advised of the need for long term treatment and the importance of lifestyle modifications to improve her current health and to decrease her risk of future health problems.  AGREE: Multiple dietary modification options and treatment options were discussed and  Penley agreed to follow the recommendations documented in the above note.  ARRANGE: Sally was educated on the importance of frequent visits to treat obesity as outlined per CMS and USPSTF guidelines and agreed to schedule her next follow up appointment today.  I, Trixie Dredge, am acting as transcriptionist for Ilene Qua, MD  I have reviewed the above documentation for accuracy and completeness, and I agree with the above. - Ilene Qua, MD

## 2019-03-25 ENCOUNTER — Ambulatory Visit (INDEPENDENT_AMBULATORY_CARE_PROVIDER_SITE_OTHER): Payer: BC Managed Care – PPO | Admitting: Family Medicine

## 2019-03-25 ENCOUNTER — Other Ambulatory Visit: Payer: Self-pay

## 2019-03-25 VITALS — BP 147/90 | HR 74 | Temp 98.1°F | Ht 62.0 in | Wt 282.0 lb

## 2019-03-25 DIAGNOSIS — Z6841 Body Mass Index (BMI) 40.0 and over, adult: Secondary | ICD-10-CM

## 2019-03-25 DIAGNOSIS — F32A Depression, unspecified: Secondary | ICD-10-CM

## 2019-03-25 DIAGNOSIS — F329 Major depressive disorder, single episode, unspecified: Secondary | ICD-10-CM

## 2019-03-25 DIAGNOSIS — Z9189 Other specified personal risk factors, not elsewhere classified: Secondary | ICD-10-CM

## 2019-03-25 DIAGNOSIS — F419 Anxiety disorder, unspecified: Secondary | ICD-10-CM

## 2019-03-25 DIAGNOSIS — I10 Essential (primary) hypertension: Secondary | ICD-10-CM

## 2019-03-25 NOTE — Progress Notes (Signed)
Office: [phone]  /  Fax: [phone]   HPI:   Chief Complaint: OBESITY Brooke [name] is here to discuss her progress with her obesity treatment plan. She is on the Category 2 plan and is following her eating plan approximately 30 % of the time. She states she is exercising 0 minutes 0 times per week. Brooke [name] is still struggling to eat on the plan after skipping meals secondary to loss of interest and fatigue. She is really struggling emotionally and voices that this is impacting not just the meal plan, but her sleep and professional life.  Her weight is 282 lb (127.9 kg) today and has had a weight gain of 1 pound over a period of 2 weeks since her last visit. She has lost 0 lbs since starting treatment with [place].  Essential Hypertension  Brooke [name] is a 48 y.o. female with hypertension. Brooke [name]'s blood pressure is slightly elevated as she has had an increase in stress recently. She is working on weight loss to help control her blood pressure with the goal of decreasing her risk of heart attack and stroke.   At risk for cardiovascular disease Brooke [name] is at a higher than average risk for cardiovascular disease due to hypertension and obesity.   Depression and Anxiety Brooke [name] has significant symptoms of lethargy, agitation, loss of interest, apathy, decreased concentration, and reduced sleep. She has been working on behavior modification techniques to help reduce her emotional eating and has been somewhat successful. She shows no sign of suicidal or homicidal ideations.  Depression screen [facility] 2/9 03/10/2019 08/14/2018 07/17/2018 06/27/2018 06/27/2018  Decreased Interest 0 0 1 2 0  Down, Depressed, Hopeless 0 0 1 1 0  PHQ - 2 Score 0 0 2 3 0  Altered sleeping 0 - 3 - -  Tired, decreased energy 0 - 3 2 -  Change in appetite 0 - 3 3 -  Feeling bad or failure about yourself  0 - 2 3 -  Trouble concentrating 0 - 1 3 -  Moving slowly or fidgety/restless 0 - 0 2 -  Suicidal thoughts 0  - 0 0 -  PHQ-9 Score 0 - 14 - -  Difficult doing work/chores - - Not difficult at all Somewhat difficult -   ASSESSMENT AND PLAN:  Essential hypertension  Anxiety and depression  At risk for heart disease  Class 3 severe obesity with serious comorbidity and body mass index (BMI) of 50.0 to 59.9 in adult, unspecified obesity type (HCC)  PLAN:  Essential Hypertension Deron agrees to follow up at her next appointment in 2 weeks. There will be no change in medications today.  Cardiovascular risk counseling Brooke [name] was given extended (15 minutes) coronary artery disease prevention counseling today. She is 48 y.o. female and has risk factors for heart disease including hypertension and obesity. We discussed intensive lifestyle modifications today with an emphasis on specific weight loss instructions and strategies. Pt was also informed of the importance of increasing exercise and decreasing saturated fats to help prevent heart disease.  Depression and Anxiety We discussed behavior modification techniques today to help Brooke [name] deal with her emotional eating and depression. She has agreed to increase Zoloft to 100 mg PO qd and she will use current prescription. Brooke [name] agreed to follow up as directed.  Obesity [name] is currently in the action stage of change. As such, her goal is to continue with weight loss efforts. She has agreed to follow the Category 2 plan. Keiryn has been instructed to work  up to a goal of 150 minutes of combined cardio and strengthening exercise per week for weight loss and overall health benefits. We discussed the following Behavioral Modification Strategies today: increasing lean protein intake, increasing vegetables, planning for success, keeping healthy foods in the home, and work on meal planning and easy cooking plans.  Brooke [name] has agreed to follow up with our clinic in 2 weeks. She was informed of the importance of frequent follow up visits to  maximize her success with intensive lifestyle modifications for her multiple health conditions.  ALLERGIES: No Known Allergies  MEDICATIONS: Current Outpatient Medications on File Prior to Visit  Medication Sig Dispense Refill   albuterol (VENTOLIN HFA) 108 (90 Base) MCG/ACT inhaler Inhale 2 puffs into the lungs every 6 (six) hours as needed for wheezing or shortness of breath. 18 g 1   calcium carbonate (OSCAL) 1500 (600 Ca) MG TABS tablet Take 1 tablet by mouth daily.     hydrochlorothiazide (MICROZIDE) 12.5 MG capsule TAKE ONE CAPSULE BY MOUTH EVERY DAY 90 capsule 1   lisdexamfetamine (VYVANSE) 40 MG capsule Take 1 capsule (40 mg total) by mouth every morning. 30 capsule 0   Magnesium Glycinate POWD 400 mg by Does not apply route daily. 100 g 0   Melatonin 5 MG TABS Take 10 mg by mouth at bedtime.      metFORMIN (GLUCOPHAGE) 500 MG tablet Take 1 tablet (500 mg total) by mouth daily with breakfast. 30 tablet 0   Multiple Vitamin (MULTIVITAMIN) tablet Take 1 tablet by mouth daily.     nebivolol (BYSTOLIC) 5 MG tablet TAKE 1 TABLET BY MOUTH DAILY 90 tablet 1   sertraline (ZOLOFT) 50 MG tablet Take 1 tablet (50 mg total) by mouth daily. (Patient taking differently: Take 50 mg by mouth 2 (two) times daily. ) 90 tablet 1   valACYclovir (VALTREX) 500 MG tablet Take 500 mg by mouth daily as needed.     VITAMIN D, CHOLECALCIFEROL, PO Take 5,000 Units by mouth daily.     No current facility-administered medications on file prior to visit.     PAST MEDICAL HISTORY: Past Medical History:  Diagnosis Date   ADD (attention deficit disorder)    Allergy    allergic rhinitis   Anemia    nos   Anxiety    Arthritis    right ankle   Asthma    as a child   Back pain    Constipation    Depression    Elevated blood pressure reading without diagnosis of hypertension    Genital herpes    Lactose intolerance    Migraine    Prediabetes    Sleep apnea    Stress     Swelling     PAST SURGICAL HISTORY: Past Surgical History:  Procedure Laterality Date   ANKLE SURGERY      SOCIAL HISTORY: Social History   Tobacco Use   Smoking status: Never Smoker   Smokeless tobacco: Never Used  Substance Use Topics   Alcohol use: No   Drug use: No    FAMILY HISTORY: Family History  Problem Relation Age of Onset   Hypertension Mother    Diabetes Mother    Hyperlipidemia Mother    Hypertension Father    Sleep apnea Father    Alcoholism Father    Obesity Father    Hypertension Other    Diabetes Maternal Aunt    Diabetes Maternal Uncle    Heart failure Paternal Aunt    Diabetes  Paternal Aunt    Heart failure Maternal Grandmother    Heart failure Maternal Grandfather    Breast cancer Sister     ROS: Review of Systems  Constitutional: Positive for malaise/fatigue. Negative for weight loss.       Positive for lethargy.  Psychiatric/Behavioral: Positive for depression. Negative for suicidal ideas. The patient is nervous/anxious.        Negative for homicidal ideations. Positive for agitation. Positive for decreased concentration. Positive for decreased sleep.    PHYSICAL EXAM: Blood pressure (!) 147/90, pulse 74, temperature 98.1 F (36.7 C), temperature source Oral, height 5\' 2"  (1.575 m), weight 282 lb (127.9 kg), SpO2 97 %. Body mass index is 51.58 kg/m. Physical Exam Vitals signs reviewed.  Constitutional:      Appearance: Normal appearance. She is obese.  Cardiovascular:     Rate and Rhythm: Normal rate.  Pulmonary:     Effort: Pulmonary effort is normal.  Musculoskeletal: Normal range of motion.  Skin:    General: Skin is warm and dry.  Neurological:     Mental Status: She is alert and oriented to person, place, and time.  Psychiatric:        Mood and Affect: Mood normal.        Behavior: Behavior normal.    RECENT LABS AND TESTS: BMET    Component Value Date/Time   NA 136 03/10/2019 1032   K 4.2  03/10/2019 1032   CL 98 03/10/2019 1032   CO2 26 03/10/2019 1032   GLUCOSE 83 03/10/2019 1032   GLUCOSE 74 04/03/2016 1017   BUN 11 03/10/2019 1032   CREATININE 0.76 03/10/2019 1032   CREATININE 0.74 04/03/2016 1017   CALCIUM 10.1 03/10/2019 1032   GFRNONAA 93 03/10/2019 1032   GFRAA 107 03/10/2019 1032   Lab Results  Component Value Date   HGBA1C 5.7 (H) 03/10/2019   HGBA1C 5.8 (H) 01/06/2019   HGBA1C 5.6 07/17/2018   HGBA1C 5.6 04/26/2018   HGBA1C 6.0 (H) 02/08/2011   Lab Results  Component Value Date   INSULIN 16.6 01/06/2019   INSULIN 7.3 07/17/2018   CBC    Component Value Date/Time   WBC 8.3 03/10/2019 1032   WBC 7.4 06/30/2011 1631   RBC 4.96 03/10/2019 1032   RBC 4.79 06/30/2011 1631   HGB 12.5 03/10/2019 1032   HCT 39.1 03/10/2019 1032   PLT 386 03/10/2019 1032   MCV 79 03/10/2019 1032   MCH 25.2 (L) 03/10/2019 1032   MCH 24.0 (L) 06/30/2011 1631   MCHC 32.0 03/10/2019 1032   MCHC 31.3 06/30/2011 1631   RDW 15.1 03/10/2019 1032   LYMPHSABS 2.0 07/17/2018 1313   MONOABS 0.5 06/30/2011 1631   EOSABS 0.1 07/17/2018 1313   BASOSABS 0.1 07/17/2018 1313   Iron/TIBC/Ferritin/ %Sat    Component Value Date/Time   IRON 41 (L) 12/17/2012 1440   FERRITIN 22.8 12/17/2012 1440   IRONPCTSAT 11.2 (L) 12/17/2012 1440   Lipid Panel     Component Value Date/Time   CHOL 181 07/17/2018 1313   TRIG 44 07/17/2018 1313   HDL 59 07/17/2018 1313   CHOLHDL 3.3 02/08/2011 1035   VLDL 8 02/08/2011 1035   LDLCALC 113 (H) 07/17/2018 1313   Hepatic Function Panel     Component Value Date/Time   PROT 7.4 03/10/2019 1032   ALBUMIN 4.0 03/10/2019 1032   AST 16 03/10/2019 1032   ALT 15 03/10/2019 1032   ALKPHOS 114 03/10/2019 1032   BILITOT 0.3 03/10/2019 1032  BILIDIR 0.1 02/08/2011 1035   IBILI 0.3 02/08/2011 1035      Component Value Date/Time   TSH 0.935 07/17/2018 1313   TSH 1.30 04/03/2016 1017   TSH 1.023 06/30/2011 1631   Results for EILY, BOLLIER  (MRN [id number]) as of 03/25/2019 16:30  Ref. Range 01/06/2019 10:08  Vitamin D, 25-Hydroxy Latest Ref Range: 30.0 - 100.0 ng/mL 47.2     OBESITY BEHAVIORAL INTERVENTION VISIT  Today's visit was # 13  Starting weight: 281 lbs Starting date: 07/17/2018 Today's weight : Weight: 282 lb (127.9 kg)  Today's date: 03/25/2019 Total lbs lost to date: 0    03/25/2019  Height 5\' 2"  (1.575 m)  Weight 282 lb (127.9 kg)  BMI (Calculated) 51.57  BLOOD PRESSURE - SYSTOLIC [id number]  BLOOD PRESSURE - DIASTOLIC 90   Body Fat % Q000111Q %  Total Body Water (lbs) 96.2 lbs    ASK: We discussed the diagnosis of obesity with Pricilla Loveless today and Jurlean agreed to give [place] permission to discuss obesity behavioral modification therapy today.  ASSESS: Tristi has the diagnosis of obesity and her BMI today is 51.57. Gudalupe is in the action stage of change.   ADVISE: Daionna was educated on the multiple health risks of obesity as well as the benefit of weight loss to improve her health. She was advised of the need for long term treatment and the importance of lifestyle modifications to improve her current health and to decrease her risk of future health problems.  AGREE: Multiple dietary modification options and treatment options were discussed and Remington agreed to follow the recommendations documented in the above note.  ARRANGE: Sybrina was educated on the importance of frequent visits to treat obesity as outlined per CMS and USPSTF guidelines and agreed to schedule her next follow up appointment today.  Lenward Chancellor, CMA, am acting as transcriptionist for Eber Jones, MD  I have reviewed the above documentation for accuracy and completeness, and I agree with the above. - Ilene Qua, MD

## 2019-03-26 ENCOUNTER — Ambulatory Visit (INDEPENDENT_AMBULATORY_CARE_PROVIDER_SITE_OTHER): Payer: BC Managed Care – PPO | Admitting: Family Medicine

## 2019-03-27 ENCOUNTER — Encounter: Payer: BC Managed Care – PPO | Admitting: Internal Medicine

## 2019-04-08 ENCOUNTER — Ambulatory Visit (INDEPENDENT_AMBULATORY_CARE_PROVIDER_SITE_OTHER): Payer: BC Managed Care – PPO | Admitting: Family Medicine

## 2019-04-08 ENCOUNTER — Other Ambulatory Visit: Payer: Self-pay

## 2019-04-08 ENCOUNTER — Encounter (INDEPENDENT_AMBULATORY_CARE_PROVIDER_SITE_OTHER): Payer: Self-pay | Admitting: Family Medicine

## 2019-04-08 VITALS — BP 130/89 | HR 73 | Temp 98.0°F | Ht 62.0 in | Wt 282.0 lb

## 2019-04-08 DIAGNOSIS — Z6841 Body Mass Index (BMI) 40.0 and over, adult: Secondary | ICD-10-CM

## 2019-04-08 DIAGNOSIS — F329 Major depressive disorder, single episode, unspecified: Secondary | ICD-10-CM

## 2019-04-08 DIAGNOSIS — R7303 Prediabetes: Secondary | ICD-10-CM | POA: Diagnosis not present

## 2019-04-08 DIAGNOSIS — F419 Anxiety disorder, unspecified: Secondary | ICD-10-CM

## 2019-04-10 NOTE — Progress Notes (Signed)
Office: [phone]  /  Fax: [phone]   HPI:   Chief Complaint: OBESITY Brooke [name] is here to discuss her progress with her obesity treatment plan. She is on the Category 2 plan and is following her eating plan approximately 0 % of the time. She states she is exercising 0 minutes 0 times per week. 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 She went on a recent trip to the beach.  Her weight is 282 lb (127.9 kg) today and has not lost weight since her last visit. She has lost 0 lbs since starting treatment with [place].  Pre-Diabetes Brooke [name] has a diagnosis of pre-diabetes based on her elevated Hgb A1c and was informed this puts her at greater risk of developing diabetes. Last Hgb A1c was of 5.7 and insulin of 16.6. She denies GI side effects of metformin. She continues to work on diet and exercise to decrease risk of diabetes.   Depression with Anxiety Brooke [name] was recently increased to 100 mg of Zoloft and noticed improvement in her symptoms. She shows no sign of suicidal or homicidal ideations.  ASSESSMENT AND PLAN:  Prediabetes  Anxiety and depression  Class 3 severe obesity with serious comorbidity and body mass index (BMI) of 50.0 to 59.9 in adult, unspecified obesity type [facility])  PLAN:  Pre-Diabetes [name] will continue to work on weight loss, exercise, and decreasing simple carbohydrates in her diet to help decrease the risk of diabetes. We dicussed metformin including benefits and risks. She was informed that eating too many simple carbohydrates or too many calories at one sitting increases the likelihood of GI side effects. Makeshia agrees to continue taking metformin, and she agrees to follow up with our clinic in 2 weeks as directed to monitor her progress.  Depression with Anxiety We discussed behavior modification techniques today to help Brooke [name] deal with her depression and anxiety.  [name] agrees to continue taking Zoloft at current does, and she agrees to follow up with our clinic in 2 weeks.  I spent > than 50% of the 15 minute visit on counseling as documented in the note.  Obesity Brooke [name] is currently in the action stage of change. As such, her goal is to continue with weight loss efforts She has agreed to follow the Category 2 plan  Brooke [name] is to aim for at least 50% on the meal plan Brooke [name] has been instructed to work up to a goal of 150 minutes of combined cardio and strengthening exercise per week for weight loss and overall health benefits. We discussed the following Behavioral Modification Strategies today: increasing lean protein intake, increasing vegetables and work on meal planning and easy cooking plans, keeping healthy foods in the home, and planning for success   Brooke [name] has agreed to follow up with our clinic in 2 weeks. She was informed of the importance of frequent follow up visits to maximize her success with intensive lifestyle modifications for her multiple health conditions.  ALLERGIES: No Known Allergies  MEDICATIONS: Current Outpatient Medications on File Prior to Visit  Medication Sig Dispense Refill  . albuterol (VENTOLIN HFA) 108 (90 Base) MCG/ACT inhaler Inhale 2 puffs into the lungs every 6 (six) hours as needed for wheezing or shortness of breath. 18 g 1  . calcium carbonate (OSCAL) 1500 (600 Ca) MG TABS tablet Take 1 tablet by mouth daily.    . hydrochlorothiazide (MICROZIDE) 12.5 MG capsule TAKE ONE CAPSULE BY MOUTH EVERY DAY  90 capsule 1  . lisdexamfetamine (VYVANSE) 40 MG capsule Take 1 capsule (40 mg total) by mouth every morning. 30 capsule 0  . Magnesium Glycinate POWD 400 mg by Does not apply route daily. 100 g 0  . Melatonin 5 MG TABS Take 10 mg by mouth at bedtime.     . metFORMIN (GLUCOPHAGE) 500 MG tablet Take 1 tablet (500 mg total) by mouth daily with breakfast. 30 tablet 0  . Multiple Vitamin (MULTIVITAMIN)  tablet Take 1 tablet by mouth daily.    . nebivolol (BYSTOLIC) 5 MG tablet TAKE 1 TABLET BY MOUTH DAILY 90 tablet 1  . sertraline (ZOLOFT) 50 MG tablet Take 1 tablet (50 mg total) by mouth daily. (Patient taking differently: Take 50 mg by mouth 2 (two) times daily. ) 90 tablet 1  . valACYclovir (VALTREX) 500 MG tablet Take 500 mg by mouth daily as needed.    Brooke [name] Kitchen VITAMIN D, CHOLECALCIFEROL, PO Take 5,000 Units by mouth daily.     No current facility-administered medications on file prior to visit.     PAST MEDICAL HISTORY: Past Medical History:  Diagnosis Date  . ADD (attention deficit disorder)   . Allergy    allergic rhinitis  . Anemia    nos  . Anxiety   . Arthritis    right ankle  . Asthma    as a child  . Back pain   . Constipation   . Depression   . Elevated blood pressure reading without diagnosis of hypertension   . Genital herpes   . Lactose intolerance   . Migraine   . Prediabetes   . Sleep apnea   . Stress   . Swelling     PAST SURGICAL HISTORY: Past Surgical History:  Procedure Laterality Date  . ANKLE SURGERY      SOCIAL HISTORY: Social History   Tobacco Use  . Smoking status: Never Smoker  . Smokeless tobacco: Never Used  Substance Use Topics  . Alcohol use: No  . Drug use: No    FAMILY HISTORY: Family History  Problem Relation Age of Onset  . Hypertension Mother   . Diabetes Mother   . Hyperlipidemia Mother   . Hypertension Father   . Sleep apnea Father   . Alcoholism Father   . Obesity Father   . Hypertension Other   . Diabetes Maternal Aunt   . Diabetes Maternal Uncle   . Heart failure Paternal Aunt   . Diabetes Paternal Aunt   . Heart failure Maternal Grandmother   . Heart failure Maternal Grandfather   . Breast cancer Sister     ROS: Review of Systems  Constitutional: Negative for weight loss.  Psychiatric/Behavioral: Positive for depression. Negative for suicidal ideas.       + Anxiety    PHYSICAL EXAM: Blood pressure  130/89, pulse 73, temperature 98 F (36.7 C), temperature source Oral, height 5\' 2"  (1.575 m), weight 282 lb (127.9 kg), SpO2 97 %. Body mass index is 51.58 kg/m. Physical Exam Vitals signs reviewed.  Constitutional:      Appearance: Normal appearance. She is obese.  Cardiovascular:     Rate and Rhythm: Normal rate.     Pulses: Normal pulses.  Pulmonary:     Effort: Pulmonary effort is normal.     Breath sounds: Normal breath sounds.  Musculoskeletal: Normal range of motion.  Skin:    General: Skin is warm and dry.  Neurological:     Mental Status: She is alert and  oriented to person, place, and time.  Psychiatric:        Mood and Affect: Mood normal.        Behavior: Behavior normal.     RECENT LABS AND TESTS: BMET    Component Value Date/Time   NA 136 03/10/2019 1032   K 4.2 03/10/2019 1032   CL 98 03/10/2019 1032   CO2 26 03/10/2019 1032   GLUCOSE 83 03/10/2019 1032   GLUCOSE 74 04/03/2016 1017   BUN 11 03/10/2019 1032   CREATININE 0.76 03/10/2019 1032   CREATININE 0.74 04/03/2016 1017   CALCIUM 10.1 03/10/2019 1032   GFRNONAA 93 03/10/2019 1032   GFRAA 107 03/10/2019 1032   Lab Results  Component Value Date   HGBA1C 5.7 (H) 03/10/2019   HGBA1C 5.8 (H) 01/06/2019   HGBA1C 5.6 07/17/2018   HGBA1C 5.6 04/26/2018   HGBA1C 6.0 (H) 02/08/2011   Lab Results  Component Value Date   INSULIN 16.6 01/06/2019   INSULIN 7.3 07/17/2018   CBC    Component Value Date/Time   WBC 8.3 03/10/2019 1032   WBC 7.4 06/30/2011 1631   RBC 4.96 03/10/2019 1032   RBC 4.79 06/30/2011 1631   HGB 12.5 03/10/2019 1032   HCT 39.1 03/10/2019 1032   PLT 386 03/10/2019 1032   MCV 79 03/10/2019 1032   MCH 25.2 (L) 03/10/2019 1032   MCH 24.0 (L) 06/30/2011 1631   MCHC 32.0 03/10/2019 1032   MCHC 31.3 06/30/2011 1631   RDW 15.1 03/10/2019 1032   LYMPHSABS 2.0 07/17/2018 1313   MONOABS 0.5 06/30/2011 1631   EOSABS 0.1 07/17/2018 1313   BASOSABS 0.1 07/17/2018 1313    Iron/TIBC/Ferritin/ %Sat    Component Value Date/Time   IRON 41 (L) 12/17/2012 1440   FERRITIN 22.8 12/17/2012 1440   IRONPCTSAT 11.2 (L) 12/17/2012 1440   Lipid Panel     Component Value Date/Time   CHOL 181 07/17/2018 1313   TRIG 44 07/17/2018 1313   HDL 59 07/17/2018 1313   CHOLHDL 3.3 02/08/2011 1035   VLDL 8 02/08/2011 1035   LDLCALC 113 (H) 07/17/2018 1313   Hepatic Function Panel     Component Value Date/Time   PROT 7.4 03/10/2019 1032   ALBUMIN 4.0 03/10/2019 1032   AST 16 03/10/2019 1032   ALT 15 03/10/2019 1032   ALKPHOS 114 03/10/2019 1032   BILITOT 0.3 03/10/2019 1032   BILIDIR 0.1 02/08/2011 1035   IBILI 0.3 02/08/2011 1035      Component Value Date/Time   TSH 0.935 07/17/2018 1313   TSH 1.30 04/03/2016 1017   TSH 1.023 06/30/2011 1631      OBESITY BEHAVIORAL INTERVENTION VISIT  Today's visit was # 14   Starting weight: 281 lbs Starting date: 07/17/2018 Today's weight : 282 lbs Today's date: 04/08/2019 Total lbs lost to date: 0    ASK: We discussed the diagnosis of obesity with Pricilla Loveless today and Cecelia agreed to give [place] permission to discuss obesity behavioral modification therapy today.  ASSESS: Jenille has the diagnosis of obesity and her BMI today is 51.57 Dayrin is in the action stage of change   ADVISE: Keyonni was educated on the multiple health risks of obesity as well as the benefit of weight loss to improve her health. She was advised of the need for long term treatment and the importance of lifestyle modifications to improve her current health and to decrease her risk of future health problems.  AGREE: Multiple dietary modification options and  treatment options were discussed and  [place] agreed to follow the recommendations documented in the above note.  ARRANGE: Pessie was educated on the importance of frequent visits to treat obesity as outlined per CMS and USPSTF guidelines and agreed to schedule her  next follow up appointment today.  I, Trixie Dredge, am acting as transcriptionist for Ilene Qua, MD  I have reviewed the above documentation for accuracy and completeness, and I agree with the above. - Ilene Qua, MD

## 2019-04-17 ENCOUNTER — Other Ambulatory Visit: Payer: Self-pay | Admitting: Cardiology

## 2019-04-17 DIAGNOSIS — Z20822 Contact with and (suspected) exposure to covid-19: Secondary | ICD-10-CM

## 2019-04-18 ENCOUNTER — Other Ambulatory Visit (INDEPENDENT_AMBULATORY_CARE_PROVIDER_SITE_OTHER): Payer: Self-pay | Admitting: Family Medicine

## 2019-04-18 DIAGNOSIS — R7303 Prediabetes: Secondary | ICD-10-CM

## 2019-04-19 LAB — NOVEL CORONAVIRUS, NAA: SARS-CoV-2, NAA: NOT DETECTED

## 2019-04-21 ENCOUNTER — Other Ambulatory Visit: Payer: Self-pay

## 2019-04-21 ENCOUNTER — Encounter: Payer: Self-pay | Admitting: Internal Medicine

## 2019-04-21 MED ORDER — HYDROCHLOROTHIAZIDE 12.5 MG PO CAPS: 12.5000 mg | ORAL_CAPSULE | Freq: Every day | ORAL | 1 refills | Status: DC

## 2019-04-21 MED ORDER — SERTRALINE HCL 100 MG PO TABS: 100.0000 mg | ORAL_TABLET | Freq: Every day | ORAL | 1 refills | Status: DC

## 2019-04-23 ENCOUNTER — Other Ambulatory Visit: Payer: Self-pay

## 2019-04-23 ENCOUNTER — Ambulatory Visit (INDEPENDENT_AMBULATORY_CARE_PROVIDER_SITE_OTHER): Payer: BC Managed Care – PPO | Admitting: Physician Assistant

## 2019-04-23 VITALS — BP 150/96 | HR 93 | Temp 98.1°F | Ht 62.0 in | Wt 282.0 lb

## 2019-04-23 DIAGNOSIS — R7303 Prediabetes: Secondary | ICD-10-CM | POA: Diagnosis not present

## 2019-04-23 DIAGNOSIS — Z9189 Other specified personal risk factors, not elsewhere classified: Secondary | ICD-10-CM | POA: Diagnosis not present

## 2019-04-23 DIAGNOSIS — I10 Essential (primary) hypertension: Secondary | ICD-10-CM | POA: Diagnosis not present

## 2019-04-23 DIAGNOSIS — Z6841 Body Mass Index (BMI) 40.0 and over, adult: Secondary | ICD-10-CM

## 2019-04-23 MED ORDER — NEBIVOLOL HCL 5 MG PO TABS: ORAL_TABLET | ORAL | 1 refills | Status: DC

## 2019-04-23 MED ORDER — METFORMIN HCL 500 MG PO TABS: 500.0000 mg | ORAL_TABLET | Freq: Every day | ORAL | 0 refills | Status: DC

## 2019-04-23 NOTE — Progress Notes (Signed)
Office: [phone]  /  Fax: [phone]   HPI:   Chief Complaint: OBESITY Brooke [name] is here to discuss her progress with her obesity treatment plan. She is on the Category 2 plan and is following her eating plan approximately 50 % of the time. She states she is exercising 0 minutes 0 times per week. Brooke [name] reports that she is not hungry for breakfast and often skips lunch. She is ready to restart.  Her weight is 282 lb (127.9 kg) today and has not lost weight since her last visit. She has lost 0 lbs since starting treatment with [place].  Hypertension Brooke [name] is a 47 y.o. female with hypertension. Brooke [name] states she has been out of Bystolic for 4 days. Her blood pressure is elevated today. She denies chest pain or headaches. She is working on weight loss to help control her blood pressure with the goal of decreasing her risk of heart attack and stroke.   At risk for cardiovascular disease Brooke [name] is at a higher than average risk for cardiovascular disease due to obesity and hypertension. She currently denies any chest pain.  Pre-Diabetes Brooke [name] has a diagnosis of pre-diabetes based on her elevated Hgb A1c and was informed this puts her at greater risk of developing diabetes. She denies nausea, vomiting, or diarrhea on metformin. She continues to work on diet and exercise to decrease risk of diabetes. She denies polyphagia.  ASSESSMENT AND PLAN:  Essential hypertension - Plan: nebivolol (BYSTOLIC) 5 MG tablet  Prediabetes - Plan: metFORMIN (GLUCOPHAGE) 500 MG tablet  At risk for heart disease  Class 3 severe obesity with serious comorbidity and body mass index (BMI) of 50.0 to 59.9 in adult, unspecified obesity type (Brooke [name])  PLAN:  Hypertension We discussed sodium restriction, working on healthy weight loss, and a regular exercise program as the means to achieve improved blood pressure control. Brooke [name] agreed with this plan and agreed to follow up as directed. We  will continue to monitor her blood pressure as well as her progress with the above lifestyle modifications. Brooke [name] agrees to continue taking Bystolic 5 mg PO q daily [id number] day supply with no refills. She will watch for signs of hypotension as she continues her lifestyle modifications. Brooke [name] agrees to follow up with our clinic in 2 weeks.  Cardiovascular risk counseling Brooke [name] was given extended (15 minutes) coronary artery disease prevention counseling today. She is 48 y.o. female and has risk factors for heart disease including obesity and hypertension. We discussed intensive lifestyle modifications today with an emphasis on specific weight loss instructions and strategies. Pt was also informed of the importance of increasing exercise and decreasing saturated fats to help prevent heart disease.  Pre-Diabetes Brooke [name] will continue to work on weight loss, exercise, and decreasing simple carbohydrates in her diet to help decrease the risk of diabetes. We dicussed metformin including benefits and risks. She was informed that eating too many simple carbohydrates or too many calories at one sitting increases the likelihood of GI side effects. Brooke [name] agrees to continue taking metformin 500 mg PO q AM #30 and we will refill for 1 month. Brooke [name] agrees to follow up with our clinic in 2 weeks as directed to monitor her progress.  Obesity Brooke [name] is currently in the action stage of change. As such, her goal is to continue with weight loss efforts She has agreed to follow the Category 2 plan Brooke [name] has been instructed to work up to a goal of 150 minutes of combined cardio and strengthening  exercise per week for weight loss and overall health benefits. We discussed the following Behavioral Modification Strategies today: increasing lean protein intake and no skipping meals   Brooke [name] has agreed to follow up with our clinic in 2 weeks. She was informed of the importance of frequent follow up  visits to maximize her success with intensive lifestyle modifications for her multiple health conditions.  ALLERGIES: No Known Allergies  MEDICATIONS: Current Outpatient Medications on File Prior to Visit  Medication Sig Dispense Refill  . albuterol (VENTOLIN HFA) 108 (90 Base) MCG/ACT inhaler Inhale 2 puffs into the lungs every 6 (six) hours as needed for wheezing or shortness of breath. 18 g 1  . calcium carbonate (OSCAL) 1500 (600 Ca) MG TABS tablet Take 1 tablet by mouth daily.    . hydrochlorothiazide (MICROZIDE) 12.5 MG capsule Take 1 capsule (12.5 mg total) by mouth daily. 90 capsule 1  . lisdexamfetamine (VYVANSE) 40 MG capsule Take 1 capsule (40 mg total) by mouth every morning. 30 capsule 0  . Magnesium Glycinate POWD 400 mg by Does not apply route daily. 100 g 0  . Melatonin 5 MG TABS Take 10 mg by mouth at bedtime.     . Multiple Vitamin (MULTIVITAMIN) tablet Take 1 tablet by mouth daily.    . sertraline (ZOLOFT) 100 MG tablet Take 1 tablet (100 mg total) by mouth daily. 90 tablet 1  . valACYclovir (VALTREX) 500 MG tablet Take 500 mg by mouth daily as needed.    Brooke [name] Kitchen VITAMIN D, CHOLECALCIFEROL, PO Take 5,000 Units by mouth daily.     No current facility-administered medications on file prior to visit.     PAST MEDICAL HISTORY: Past Medical History:  Diagnosis Date  . ADD (attention deficit disorder)   . Allergy    allergic rhinitis  . Anemia    nos  . Anxiety   . Arthritis    right ankle  . Asthma    as a child  . Back pain   . Constipation   . Depression   . Elevated blood pressure reading without diagnosis of hypertension   . Genital herpes   . Lactose intolerance   . Migraine   . Prediabetes   . Sleep apnea   . Stress   . Swelling     PAST SURGICAL HISTORY: Past Surgical History:  Procedure Laterality Date  . ANKLE SURGERY      SOCIAL HISTORY: Social History   Tobacco Use  . Smoking status: Never Smoker  . Smokeless tobacco: Never Used   Substance Use Topics  . Alcohol use: No  . Drug use: No    FAMILY HISTORY: Family History  Problem Relation Age of Onset  . Hypertension Mother   . Diabetes Mother   . Hyperlipidemia Mother   . Hypertension Father   . Sleep apnea Father   . Alcoholism Father   . Obesity Father   . Hypertension Other   . Diabetes Maternal Aunt   . Diabetes Maternal Uncle   . Heart failure Paternal Aunt   . Diabetes Paternal Aunt   . Heart failure Maternal Grandmother   . Heart failure Maternal Grandfather   . Breast cancer Sister     ROS: Review of Systems  Constitutional: Negative for weight loss.  Cardiovascular: Negative for chest pain.  Gastrointestinal: Negative for nausea and vomiting.  Neurological: Negative for headaches.  Endo/Heme/Allergies:       Negative polyphagia    PHYSICAL EXAM: Blood pressure (!) 150/96, pulse 93,  temperature 98.1 F (36.7 C), temperature source Oral, height 5\' 2"  (1.575 m), weight 282 lb (127.9 kg), SpO2 97 %. Body mass index is 51.58 kg/m. Physical Exam Vitals signs reviewed.  Constitutional:      Appearance: Normal appearance. She is obese.  Cardiovascular:     Rate and Rhythm: Normal rate.     Pulses: Normal pulses.  Pulmonary:     Effort: Pulmonary effort is normal.     Breath sounds: Normal breath sounds.  Musculoskeletal: Normal range of motion.  Skin:    General: Skin is warm and dry.  Neurological:     Mental Status: She is alert and oriented to person, place, and time.  Psychiatric:        Mood and Affect: Mood normal.        Behavior: Behavior normal.     RECENT LABS AND TESTS: BMET    Component Value Date/Time   NA 136 03/10/2019 1032   K 4.2 03/10/2019 1032   CL 98 03/10/2019 1032   CO2 26 03/10/2019 1032   GLUCOSE 83 03/10/2019 1032   GLUCOSE 74 04/03/2016 1017   BUN 11 03/10/2019 1032   CREATININE 0.76 03/10/2019 1032   CREATININE 0.74 04/03/2016 1017   CALCIUM 10.1 03/10/2019 1032   GFRNONAA 93 03/10/2019 1032    GFRAA 107 03/10/2019 1032   Lab Results  Component Value Date   HGBA1C 5.7 (H) 03/10/2019   HGBA1C 5.8 (H) 01/06/2019   HGBA1C 5.6 07/17/2018   HGBA1C 5.6 04/26/2018   HGBA1C 6.0 (H) 02/08/2011   Lab Results  Component Value Date   INSULIN 16.6 01/06/2019   INSULIN 7.3 07/17/2018   CBC    Component Value Date/Time   WBC 8.3 03/10/2019 1032   WBC 7.4 06/30/2011 1631   RBC 4.96 03/10/2019 1032   RBC 4.79 06/30/2011 1631   HGB 12.5 03/10/2019 1032   HCT 39.1 03/10/2019 1032   PLT 386 03/10/2019 1032   MCV 79 03/10/2019 1032   MCH 25.2 (L) 03/10/2019 1032   MCH 24.0 (L) 06/30/2011 1631   MCHC 32.0 03/10/2019 1032   MCHC 31.3 06/30/2011 1631   RDW 15.1 03/10/2019 1032   LYMPHSABS 2.0 07/17/2018 1313   MONOABS 0.5 06/30/2011 1631   EOSABS 0.1 07/17/2018 1313   BASOSABS 0.1 07/17/2018 1313   Iron/TIBC/Ferritin/ %Sat    Component Value Date/Time   IRON 41 (L) 12/17/2012 1440   FERRITIN 22.8 12/17/2012 1440   IRONPCTSAT 11.2 (L) 12/17/2012 1440   Lipid Panel     Component Value Date/Time   CHOL 181 07/17/2018 1313   TRIG 44 07/17/2018 1313   HDL 59 07/17/2018 1313   CHOLHDL 3.3 02/08/2011 1035   VLDL 8 02/08/2011 1035   LDLCALC 113 (H) 07/17/2018 1313   Hepatic Function Panel     Component Value Date/Time   PROT 7.4 03/10/2019 1032   ALBUMIN 4.0 03/10/2019 1032   AST 16 03/10/2019 1032   ALT 15 03/10/2019 1032   ALKPHOS 114 03/10/2019 1032   BILITOT 0.3 03/10/2019 1032   BILIDIR 0.1 02/08/2011 1035   IBILI 0.3 02/08/2011 1035      Component Value Date/Time   TSH 0.935 07/17/2018 1313   TSH 1.30 04/03/2016 1017   TSH 1.023 06/30/2011 1631      OBESITY BEHAVIORAL INTERVENTION VISIT  Today's visit was # 15   Starting weight: 281 lbs Starting date: 07/17/2018 Today's weight : 282 lbs Today's date: 04/23/2019 Total lbs lost to date: 0  ASK: We discussed the diagnosis of obesity with Brooke [name] today and Jahni agreed to give [place]  permission to discuss obesity behavioral modification therapy today.  ASSESS: Jnyla has the diagnosis of obesity and her BMI today is 51.57 Kiasia is in the action stage of change   ADVISE: Jeylah was educated on the multiple health risks of obesity as well as the benefit of weight loss to improve her health. She was advised of the need for long term treatment and the importance of lifestyle modifications to improve her current health and to decrease her risk of future health problems.  AGREE: Multiple dietary modification options and treatment options were discussed and  Marlise agreed to follow the recommendations documented in the above note.  ARRANGE: Bertille was educated on the importance of frequent visits to treat obesity as outlined per CMS and USPSTF guidelines and agreed to schedule her next follow up appointment today.  Wilhemena Durie, am acting as transcriptionist for Abby Potash, PA-C I, Abby Potash, PA-C have reviewed above note and agree with its content

## 2019-04-25 ENCOUNTER — Encounter: Payer: Self-pay | Admitting: Internal Medicine

## 2019-04-27 ENCOUNTER — Other Ambulatory Visit: Payer: Self-pay | Admitting: Internal Medicine

## 2019-04-27 DIAGNOSIS — F988 Other specified behavioral and emotional disorders with onset usually occurring in childhood and adolescence: Secondary | ICD-10-CM

## 2019-04-27 MED ORDER — LISDEXAMFETAMINE DIMESYLATE 40 MG PO CAPS: 40.0000 mg | ORAL_CAPSULE | ORAL | 0 refills | Status: DC

## 2019-05-15 ENCOUNTER — Ambulatory Visit (INDEPENDENT_AMBULATORY_CARE_PROVIDER_SITE_OTHER): Payer: BC Managed Care – PPO | Admitting: Physician Assistant

## 2019-05-15 ENCOUNTER — Other Ambulatory Visit: Payer: Self-pay

## 2019-05-15 ENCOUNTER — Encounter (INDEPENDENT_AMBULATORY_CARE_PROVIDER_SITE_OTHER): Payer: Self-pay | Admitting: Physician Assistant

## 2019-05-15 VITALS — BP 147/96 | HR 77 | Temp 98.0°F | Ht 62.0 in | Wt 286.0 lb

## 2019-05-15 DIAGNOSIS — E559 Vitamin D deficiency, unspecified: Secondary | ICD-10-CM | POA: Diagnosis not present

## 2019-05-15 DIAGNOSIS — Z6841 Body Mass Index (BMI) 40.0 and over, adult: Secondary | ICD-10-CM

## 2019-05-19 NOTE — Progress Notes (Signed)
Office: [phone]  /  Fax: [phone]   HPI:   Chief Complaint: OBESITY Brooke [name] is here to discuss her progress with her obesity treatment plan. She is on the Category 2 plan and is following her eating plan approximately 50 % of the time. She states she is exercising 0 minutes 0 times per week. Brooke [name] reports frustration with herself, as she has not been following the plan. She wants to continue, but she is feeling unmotivated. Her weight is 286 lb (129.7 kg) today and has had a weight gain of 4 pounds over a period of 3 weeks since her last visit. She has gained 5 lbs since starting treatment with [place].  Vitamin D deficiency Brooke [name] has a diagnosis of vitamin D deficiency. Brooke [name] is currently taking OTC vit D and she denies nausea, vomiting or muscle weakness.  ASSESSMENT AND PLAN:  Vitamin D deficiency  Class 3 severe obesity with serious comorbidity and body mass index (BMI) of 50.0 to 59.9 in adult, unspecified obesity type ([place])  PLAN:  Vitamin D Deficiency Brooke [name] was informed that low vitamin D levels contributes to fatigue and are associated with obesity, breast, and colon cancer. Brooke [name] will continue to take OTC vitamin D @5 ,000 IU daily and she will follow up for routine testing of vitamin D, at least 2-3 times per year. She was informed of the risk of over-replacement of vitamin D and agrees to not increase her dose unless she discusses this with [place] first.  I spent > than 50% of the 25 minute visit on counseling as documented in the note.  Obesity Brooke [name] is currently in the action stage of change. As such, her goal is to continue with weight loss efforts She has agreed to keep a food journal with 1200 calories and 85 grams of protein daily Brooke [name] has been instructed to work up to a goal of 150 minutes of combined cardio and strengthening exercise per week for weight loss and overall health benefits. We discussed the following Behavioral  Modification Strategies today: keeping healthy foods in the home and work on meal planning and easy cooking plans  Brooke [name] has agreed to follow up with our clinic in 2 weeks. She was informed of the importance of frequent follow up visits to maximize her success with intensive lifestyle modifications for her multiple health conditions.  I spent > than 50% of the 25 minute visit on counseling as documented in the note.   ALLERGIES: No Known Allergies  MEDICATIONS: Current Outpatient Medications on File Prior to Visit  Medication Sig Dispense Refill   albuterol (VENTOLIN HFA) 108 (90 Base) MCG/ACT inhaler Inhale 2 puffs into the lungs every 6 (six) hours as needed for wheezing or shortness of breath. 18 g 1   calcium carbonate (OSCAL) 1500 (600 Ca) MG TABS tablet Take 1 tablet by mouth daily.     hydrochlorothiazide (MICROZIDE) 12.5 MG capsule Take 1 capsule (12.5 mg total) by mouth daily. 90 capsule 1   lisdexamfetamine (VYVANSE) 40 MG capsule Take 1 capsule (40 mg total) by mouth every morning. 30 capsule 0   Magnesium Glycinate POWD 400 mg by Does not apply route daily. 100 g 0   Melatonin 5 MG TABS Take 10 mg by mouth at bedtime.      metFORMIN (GLUCOPHAGE) 500 MG tablet Take 1 tablet (500 mg total) by mouth daily with breakfast. 30 tablet 0   Multiple Vitamin (MULTIVITAMIN) tablet Take 1 tablet by mouth daily.     nebivolol (BYSTOLIC) 5  MG tablet TAKE 1 TABLET BY MOUTH DAILY 90 tablet 1   sertraline (ZOLOFT) 100 MG tablet Take 1 tablet (100 mg total) by mouth daily. 90 tablet 1   valACYclovir (VALTREX) 500 MG tablet Take 500 mg by mouth daily as needed.     VITAMIN D, CHOLECALCIFEROL, PO Take 5,000 Units by mouth daily.     No current facility-administered medications on file prior to visit.     PAST MEDICAL HISTORY: Past Medical History:  Diagnosis Date   ADD (attention deficit disorder)    Allergy    allergic rhinitis   Anemia    nos   Anxiety     Arthritis    right ankle   Asthma    as a child   Back pain    Constipation    Depression    Elevated blood pressure reading without diagnosis of hypertension    Genital herpes    Lactose intolerance    Migraine    Prediabetes    Sleep apnea    Stress    Swelling     PAST SURGICAL HISTORY: Past Surgical History:  Procedure Laterality Date   ANKLE SURGERY      SOCIAL HISTORY: Social History   Tobacco Use   Smoking status: Never Smoker   Smokeless tobacco: Never Used  Substance Use Topics   Alcohol use: No   Drug use: No    FAMILY HISTORY: Family History  Problem Relation Age of Onset   Hypertension Mother    Diabetes Mother    Hyperlipidemia Mother    Hypertension Father    Sleep apnea Father    Alcoholism Father    Obesity Father    Hypertension Other    Diabetes Maternal Aunt    Diabetes Maternal Uncle    Heart failure Paternal Aunt    Diabetes Paternal Aunt    Heart failure Maternal Grandmother    Heart failure Maternal Grandfather    Breast cancer Sister     ROS: Review of Systems  Constitutional: Negative for weight loss.  Gastrointestinal: Negative for nausea and vomiting.  Musculoskeletal:       Negative for muscle weakness    PHYSICAL EXAM: Blood pressure (!) 147/96, pulse 77, temperature 98 F (36.7 C), temperature source Oral, height 5\' 2"  (1.575 m), weight 286 lb (129.7 kg), SpO2 99 %. Body mass index is 52.31 kg/m. Physical Exam Vitals signs reviewed.  Constitutional:      Appearance: Normal appearance. She is well-developed. She is obese.  Cardiovascular:     Rate and Rhythm: Normal rate.  Pulmonary:     Effort: Pulmonary effort is normal.  Musculoskeletal: Normal range of motion.  Skin:    General: Skin is warm and dry.  Neurological:     Mental Status: She is alert and oriented to person, place, and time.  Psychiatric:        Mood and Affect: Mood normal.        Behavior: Behavior normal.       RECENT LABS AND TESTS: BMET    Component Value Date/Time   NA 136 03/10/2019 1032   K 4.2 03/10/2019 1032   CL 98 03/10/2019 1032   CO2 26 03/10/2019 1032   GLUCOSE 83 03/10/2019 1032   GLUCOSE 74 04/03/2016 1017   BUN 11 03/10/2019 1032   CREATININE 0.76 03/10/2019 1032   CREATININE 0.74 04/03/2016 1017   CALCIUM 10.1 03/10/2019 1032   GFRNONAA 93 03/10/2019 1032   GFRAA 107 03/10/2019 1032  Lab Results  Component Value Date   HGBA1C 5.7 (H) 03/10/2019   HGBA1C 5.8 (H) 01/06/2019   HGBA1C 5.6 07/17/2018   HGBA1C 5.6 04/26/2018   HGBA1C 6.0 (H) 02/08/2011   Lab Results  Component Value Date   INSULIN 16.6 01/06/2019   INSULIN 7.3 07/17/2018   CBC    Component Value Date/Time   WBC 8.3 03/10/2019 1032   WBC 7.4 06/30/2011 1631   RBC 4.96 03/10/2019 1032   RBC 4.79 06/30/2011 1631   HGB 12.5 03/10/2019 1032   HCT 39.1 03/10/2019 1032   PLT 386 03/10/2019 1032   MCV 79 03/10/2019 1032   MCH 25.2 (L) 03/10/2019 1032   MCH 24.0 (L) 06/30/2011 1631   MCHC 32.0 03/10/2019 1032   MCHC 31.3 06/30/2011 1631   RDW 15.1 03/10/2019 1032   LYMPHSABS 2.0 07/17/2018 1313   MONOABS 0.5 06/30/2011 1631   EOSABS 0.1 07/17/2018 1313   BASOSABS 0.1 07/17/2018 1313   Iron/TIBC/Ferritin/ %Sat    Component Value Date/Time   IRON 41 (L) 12/17/2012 1440   FERRITIN 22.8 12/17/2012 1440   IRONPCTSAT 11.2 (L) 12/17/2012 1440   Lipid Panel     Component Value Date/Time   CHOL 181 07/17/2018 1313   TRIG 44 07/17/2018 1313   HDL 59 07/17/2018 1313   CHOLHDL 3.3 02/08/2011 1035   VLDL 8 02/08/2011 1035   LDLCALC 113 (H) 07/17/2018 1313   Hepatic Function Panel     Component Value Date/Time   PROT 7.4 03/10/2019 1032   ALBUMIN 4.0 03/10/2019 1032   AST 16 03/10/2019 1032   ALT 15 03/10/2019 1032   ALKPHOS 114 03/10/2019 1032   BILITOT 0.3 03/10/2019 1032   BILIDIR 0.1 02/08/2011 1035   IBILI 0.3 02/08/2011 1035      Component Value Date/Time   TSH 0.935  07/17/2018 1313   TSH 1.30 04/03/2016 1017   TSH 1.023 06/30/2011 1631     Ref. Range 01/06/2019 10:08  Vitamin D, 25-Hydroxy Latest Ref Range: 30.0 - 100.0 ng/mL 47.2    OBESITY BEHAVIORAL INTERVENTION VISIT  Today's visit was # 16   Starting weight: 281 lbs Starting date: 07/17/2018 Today's weight : 286 lbs Today's date: 05/15/2019 Total lbs lost to date: 0    05/15/2019  Height 5\' 2"  (1.575 m)  Weight 286 lb (129.7 kg)  BMI (Calculated) 52.3  BLOOD PRESSURE - SYSTOLIC [id number]  BLOOD PRESSURE - DIASTOLIC 96   Body Fat % [id number] %    ASK: We discussed the diagnosis of obesity with Brooke [name] today and Brooke [name] agreed to give [place] permission to discuss obesity behavioral modification therapy today.  ASSESS: Brooke [name] has the diagnosis of obesity and her BMI today is 52.3 Brooke [name] is in the action stage of change   ADVISE: Brooke [name] was educated on the multiple health risks of obesity as well as the benefit of weight loss to improve her health. She was advised of the need for long term treatment and the importance of lifestyle modifications to improve her current health and to decrease her risk of future health problems.  AGREE: Multiple dietary modification options and treatment options were discussed and  Brooke [name] agreed to follow the recommendations documented in the above note.  ARRANGE: Brooke [name] was educated on the importance of frequent visits to treat obesity as outlined per CMS and USPSTF guidelines and agreed to schedule her next follow up appointment today.  Corey Skains, am acting as transcriptionist for Brooke Potash, PA-C I, Brooke [name]  Brooke Holm, PA-C have reviewed above note and agree with its content

## 2019-05-23 ENCOUNTER — Encounter (INDEPENDENT_AMBULATORY_CARE_PROVIDER_SITE_OTHER): Payer: Self-pay | Admitting: Physician Assistant

## 2019-05-28 ENCOUNTER — Telehealth (INDEPENDENT_AMBULATORY_CARE_PROVIDER_SITE_OTHER): Payer: BC Managed Care – PPO | Admitting: Physician Assistant

## 2019-05-28 ENCOUNTER — Other Ambulatory Visit: Payer: Self-pay

## 2019-05-28 ENCOUNTER — Encounter: Payer: Self-pay | Admitting: Internal Medicine

## 2019-05-28 ENCOUNTER — Encounter (INDEPENDENT_AMBULATORY_CARE_PROVIDER_SITE_OTHER): Payer: Self-pay | Admitting: Physician Assistant

## 2019-05-28 DIAGNOSIS — I10 Essential (primary) hypertension: Secondary | ICD-10-CM | POA: Diagnosis not present

## 2019-05-28 DIAGNOSIS — Z6841 Body Mass Index (BMI) 40.0 and over, adult: Secondary | ICD-10-CM | POA: Diagnosis not present

## 2019-05-28 DIAGNOSIS — R7303 Prediabetes: Secondary | ICD-10-CM | POA: Diagnosis not present

## 2019-05-28 MED ORDER — METFORMIN HCL 500 MG PO TABS: 500.0000 mg | ORAL_TABLET | Freq: Every day | ORAL | 0 refills | Status: DC

## 2019-05-28 NOTE — Progress Notes (Signed)
Office: [phone]  /  Fax: [phone] TeleHealth Visit:  Brooke [name] has verbally consented to this TeleHealth visit today. The patient is located at home, the provider is located at the [facility] office. The participants in this visit include the listed provider and patient and any and all parties involved. The visit was conducted today via WebEx.  HPI:   Chief Complaint: OBESITY Brooke [name] is here to discuss her progress with her obesity treatment plan. She is on the Category 2 plan and is following her eating plan approximately 60 % of the time. She states she is exercising 0 minutes 0 times per week. Brooke [name]'s most recent weight was 285 pounds. She has been doing a better job with getting more of the food in on the plan recently. She states that she is not a breakfast eater. She also struggles to get all of the protein in during the day. We were unable to weigh the patient today for this TeleHealth visit. She feels as if she has gained weight since her last visit. She has gained 5 lbs since starting treatment with [place].  Hypertension DIAMONDS ABOUD is a 48 y.o. female with hypertension. She has no chest pain or headache. Brooke [name] is on Bystolic. Brooke [name] has no lightheadedness. She is working weight loss to help control her blood pressure with the goal of decreasing her risk of heart attack and stroke. Stephanies blood pressure is currently controlled.  Pre-Diabetes Brooke [name] has a diagnosis of prediabetes based on her elevated Hgb A1c and was informed this puts her at greater risk of developing diabetes. Nayely is on metformin and she denies nausea, vomiting or diarrhea. She continues to work on diet and exercise to decrease risk of diabetes. She has no polyphagia.  ASSESSMENT AND PLAN:  Essential hypertension  Prediabetes - Plan: metFORMIN (GLUCOPHAGE) 500 MG tablet  Class 3 severe obesity with serious comorbidity and body mass index  (BMI) of 50.0 to 59.9 in adult, unspecified obesity type ([place])  PLAN:  Hypertension We discussed sodium restriction, working on healthy weight loss, and a regular exercise program as the means to achieve improved blood pressure control. Armetta agreed with this plan and agreed to follow up as directed. We will continue to monitor her blood pressure as well as her progress with the above lifestyle modifications. She will continue her medications as prescribed and will watch for signs of hypotension as she continues her lifestyle modifications.  Pre-Diabetes Brooke [name] will continue to work on weight loss, exercise, and decreasing simple carbohydrates in her diet to help decrease the risk of diabetes. She was informed that eating too many simple carbohydrates or too many calories at one sitting increases the likelihood of GI side effects. Brooke [name] agrees to continue metformin 500 mg daily with breakfast #30 with no refills and follow up with [place] as directed to monitor her progress.  Obesity Brooke [name] is currently in the action stage of change. As such, her goal is to continue with weight loss efforts She has agreed to follow the Category 2 plan Brooke [name] has been instructed to work up to a goal of 150 minutes of combined cardio and strengthening exercise per week for weight loss and overall health benefits. We discussed the following Behavioral Modification Strategies today: no skipping meals and work on meal planning and easy cooking plans  Brooke [name] has agreed to follow up with our clinic in 2 weeks. She was informed of the importance of frequent follow up visits to maximize  her success with intensive lifestyle modifications for her multiple health conditions.  I spent > than 50% of the 25 minute visit on counseling as documented in the note.   ALLERGIES: No Known Allergies  MEDICATIONS: Current Outpatient Medications on File Prior to Visit  Medication Sig Dispense Refill   albuterol  (VENTOLIN HFA) 108 (90 Base) MCG/ACT inhaler Inhale 2 puffs into the lungs every 6 (six) hours as needed for wheezing or shortness of breath. 18 g 1   calcium carbonate (OSCAL) 1500 (600 Ca) MG TABS tablet Take 1 tablet by mouth daily.     hydrochlorothiazide (MICROZIDE) 12.5 MG capsule Take 1 capsule (12.5 mg total) by mouth daily. 90 capsule 1   lisdexamfetamine (VYVANSE) 40 MG capsule Take 1 capsule (40 mg total) by mouth every morning. 30 capsule 0   Magnesium Glycinate POWD 400 mg by Does not apply route daily. 100 g 0   Melatonin 5 MG TABS Take 10 mg by mouth at bedtime.      Multiple Vitamin (MULTIVITAMIN) tablet Take 1 tablet by mouth daily.     nebivolol (BYSTOLIC) 5 MG tablet TAKE 1 TABLET BY MOUTH DAILY 90 tablet 1   sertraline (ZOLOFT) 100 MG tablet Take 1 tablet (100 mg total) by mouth daily. 90 tablet 1   valACYclovir (VALTREX) 500 MG tablet Take 500 mg by mouth daily as needed.     VITAMIN D, CHOLECALCIFEROL, PO Take 5,000 Units by mouth daily.     No current facility-administered medications on file prior to visit.     PAST MEDICAL HISTORY: Past Medical History:  Diagnosis Date   ADD (attention deficit disorder)    Allergy    allergic rhinitis   Anemia    nos   Anxiety    Arthritis    right ankle   Asthma    as a child   Back pain    Constipation    Depression    Elevated blood pressure reading without diagnosis of hypertension    Genital herpes    Lactose intolerance    Migraine    Prediabetes    Sleep apnea    Stress    Swelling     PAST SURGICAL HISTORY: Past Surgical History:  Procedure Laterality Date   ANKLE SURGERY      SOCIAL HISTORY: Social History   Tobacco Use   Smoking status: Never Smoker   Smokeless tobacco: Never Used  Substance Use Topics   Alcohol use: No   Drug use: No    FAMILY HISTORY: Family History  Problem Relation Age of Onset   Hypertension Mother    Diabetes Mother     Hyperlipidemia Mother    Hypertension Father    Sleep apnea Father    Alcoholism Father    Obesity Father    Hypertension Other    Diabetes Maternal Aunt    Diabetes Maternal Uncle    Heart failure Paternal Aunt    Diabetes Paternal Aunt    Heart failure Maternal Grandmother    Heart failure Maternal Grandfather    Breast cancer Sister     ROS: Review of Systems  Constitutional: Negative for weight loss.  Cardiovascular: Negative for chest pain.  Gastrointestinal: Negative for diarrhea, nausea and vomiting.  Neurological: Negative for headaches.       Negative for lightheadedness  Endo/Heme/Allergies:       Negative for polyphagia    PHYSICAL EXAM: Pt in no acute distress  RECENT LABS AND TESTS: BMET  Component Value Date/Time   NA 136 03/10/2019 1032   K 4.2 03/10/2019 1032   CL 98 03/10/2019 1032   CO2 26 03/10/2019 1032   GLUCOSE 83 03/10/2019 1032   GLUCOSE 74 04/03/2016 1017   BUN 11 03/10/2019 1032   CREATININE 0.76 03/10/2019 1032   CREATININE 0.74 04/03/2016 1017   CALCIUM 10.1 03/10/2019 1032   GFRNONAA 93 03/10/2019 1032   GFRAA 107 03/10/2019 1032   Lab Results  Component Value Date   HGBA1C 5.7 (H) 03/10/2019   HGBA1C 5.8 (H) 01/06/2019   HGBA1C 5.6 07/17/2018   HGBA1C 5.6 04/26/2018   HGBA1C 6.0 (H) 02/08/2011   Lab Results  Component Value Date   INSULIN 16.6 01/06/2019   INSULIN 7.3 07/17/2018   CBC    Component Value Date/Time   WBC 8.3 03/10/2019 1032   WBC 7.4 06/30/2011 1631   RBC 4.96 03/10/2019 1032   RBC 4.79 06/30/2011 1631   HGB 12.5 03/10/2019 1032   HCT 39.1 03/10/2019 1032   PLT 386 03/10/2019 1032   MCV 79 03/10/2019 1032   MCH 25.2 (L) 03/10/2019 1032   MCH 24.0 (L) 06/30/2011 1631   MCHC 32.0 03/10/2019 1032   MCHC 31.3 06/30/2011 1631   RDW 15.1 03/10/2019 1032   LYMPHSABS 2.0 07/17/2018 1313   MONOABS 0.5 06/30/2011 1631   EOSABS 0.1 07/17/2018 1313   BASOSABS 0.1 07/17/2018 1313    Iron/TIBC/Ferritin/ %Sat    Component Value Date/Time   IRON 41 (L) 12/17/2012 1440   FERRITIN 22.8 12/17/2012 1440   IRONPCTSAT 11.2 (L) 12/17/2012 1440   Lipid Panel     Component Value Date/Time   CHOL 181 07/17/2018 1313   TRIG 44 07/17/2018 1313   HDL 59 07/17/2018 1313   CHOLHDL 3.3 02/08/2011 1035   VLDL 8 02/08/2011 1035   LDLCALC 113 (H) 07/17/2018 1313   Hepatic Function Panel     Component Value Date/Time   PROT 7.4 03/10/2019 1032   ALBUMIN 4.0 03/10/2019 1032   AST 16 03/10/2019 1032   ALT 15 03/10/2019 1032   ALKPHOS 114 03/10/2019 1032   BILITOT 0.3 03/10/2019 1032   BILIDIR 0.1 02/08/2011 1035   IBILI 0.3 02/08/2011 1035      Component Value Date/Time   TSH 0.935 07/17/2018 1313   TSH 1.30 04/03/2016 1017   TSH 1.023 06/30/2011 1631     Ref. Range 01/06/2019 10:08  Vitamin D, 25-Hydroxy Latest Ref Range: 30.0 - 100.0 ng/mL 47.2    I, Doreene Nest, am acting as Location manager for Abby Potash, PA-C I, Abby Potash, PA-C have reviewed above note and agree with its content

## 2019-05-30 ENCOUNTER — Other Ambulatory Visit: Payer: Self-pay

## 2019-05-30 ENCOUNTER — Encounter: Payer: Self-pay | Admitting: Internal Medicine

## 2019-05-30 ENCOUNTER — Ambulatory Visit: Payer: BC Managed Care – PPO | Admitting: Internal Medicine

## 2019-05-30 VITALS — BP 126/70 | HR 79 | Temp 97.8°F | Ht 62.0 in | Wt 286.4 lb

## 2019-05-30 DIAGNOSIS — Z6841 Body Mass Index (BMI) 40.0 and over, adult: Secondary | ICD-10-CM

## 2019-05-30 DIAGNOSIS — I10 Essential (primary) hypertension: Secondary | ICD-10-CM

## 2019-05-30 DIAGNOSIS — F988 Other specified behavioral and emotional disorders with onset usually occurring in childhood and adolescence: Secondary | ICD-10-CM

## 2019-05-30 MED ORDER — LISDEXAMFETAMINE DIMESYLATE 40 MG PO CAPS: 40.0000 mg | ORAL_CAPSULE | ORAL | 0 refills | Status: DC

## 2019-05-30 NOTE — Patient Instructions (Signed)
COVID-19: How to Protect Yourself and Others Know how it spreads  There is currently no vaccine to prevent coronavirus disease 2019 (COVID-19).  The best way to prevent illness is to avoid being exposed to this virus.  The virus is thought to spread mainly from person-to-person. ? Between people who are in close contact with one another (within about 6 feet). ? Through respiratory droplets produced when an infected person coughs, sneezes or talks. ? These droplets can land in the mouths or noses of people who are nearby or possibly be inhaled into the lungs. ? Some recent studies have suggested that COVID-19 may be spread by people who are not showing symptoms. Everyone should Clean your hands often  Wash your hands often with soap and water for at least 20 seconds especially after you have been in a public place, or after blowing your nose, coughing, or sneezing.  If soap and water are not readily available, use a hand sanitizer that contains at least 60% alcohol. Cover all surfaces of your hands and rub them together until they feel dry.  Avoid touching your eyes, nose, and mouth with unwashed hands. Avoid close contact  Stay home if you are sick.  Avoid close contact with people who are sick.  Put distance between yourself and other people. ? Remember that some people without symptoms may be able to spread virus. ? This is especially important for people who are at higher risk of getting very sick.www.cdc.gov/coronavirus/2019-ncov/need-extra-precautions/people-at-higher-risk.html Cover your mouth and nose with a cloth face cover when around others  You could spread COVID-19 to others even if you do not feel sick.  Everyone should wear a cloth face cover when they have to go out in public, for example to the grocery store or to pick up other necessities. ? Cloth face coverings should not be placed on young children under age 2, anyone who has trouble breathing, or is unconscious,  incapacitated or otherwise unable to remove the mask without assistance.  The cloth face cover is meant to protect other people in case you are infected.  Do NOT use a facemask meant for a healthcare worker.  Continue to keep about 6 feet between yourself and others. The cloth face cover is not a substitute for social distancing. Cover coughs and sneezes  If you are in a private setting and do not have on your cloth face covering, remember to always cover your mouth and nose with a tissue when you cough or sneeze or use the inside of your elbow.  Throw used tissues in the trash.  Immediately wash your hands with soap and water for at least 20 seconds. If soap and water are not readily available, clean your hands with a hand sanitizer that contains at least 60% alcohol. Clean and disinfect  Clean AND disinfect frequently touched surfaces daily. This includes tables, doorknobs, light switches, countertops, handles, desks, phones, keyboards, toilets, faucets, and sinks. www.cdc.gov/coronavirus/2019-ncov/prevent-getting-sick/disinfecting-your-home.html  If surfaces are dirty, clean them: Use detergent or soap and water prior to disinfection.  Then, use a household disinfectant. You can see a list of EPA-registered household disinfectants here. cdc.gov/coronavirus 10/29/2018 This information is not intended to replace advice given to you by your health care provider. Make sure you discuss any questions you have with your health care provider. Document Released: 10/08/2018 Document Revised: 11/06/2018 Document Reviewed: 10/08/2018 Elsevier Patient Education  2020 [employer] Inc.  

## 2019-06-01 NOTE — Progress Notes (Signed)
This visit occurred during the SARS-CoV-2 public health emergency.  Safety protocols were in place, including screening questions prior to the visit, additional usage of staff PPE, and extensive cleaning of exam room while observing appropriate contact time as indicated for disinfecting solutions.  Subjective:     Patient ID: Brooke [name] , female    DOB: 1970/12/26 , 48 y.o.   MRN: [id number]   Chief Complaint  Patient presents with  . Vyvanse f/u    HPI  She is here today for f/u ADD. She feels well on Vyvanse. She has not had any issues with the medication.     Past Medical History:  Diagnosis Date  . ADD (attention deficit disorder)   . Allergy    allergic rhinitis  . Anemia    nos  . Anxiety   . Arthritis    right ankle  . Asthma    as a child  . Back pain   . Constipation   . Depression   . Elevated blood pressure reading without diagnosis of hypertension   . Genital herpes   . Lactose intolerance   . Migraine   . Prediabetes   . Sleep apnea   . Stress   . Swelling      Family History  Problem Relation Age of Onset  . Hypertension Mother   . Diabetes Mother   . Hyperlipidemia Mother   . Hypertension Father   . Sleep apnea Father   . Alcoholism Father   . Obesity Father   . Hypertension Other   . Diabetes Maternal Aunt   . Diabetes Maternal Uncle   . Heart failure Paternal Aunt   . Diabetes Paternal Aunt   . Heart failure Maternal Grandmother   . Heart failure Maternal Grandfather   . Breast cancer Sister      Current Outpatient Medications:  .  albuterol (VENTOLIN HFA) 108 (90 Base) MCG/ACT inhaler, Inhale 2 puffs into the lungs every 6 (six) hours as needed for wheezing or shortness of breath., Disp: 18 g, Rfl: 1 .  calcium carbonate (OSCAL) 1500 (600 Ca) MG TABS tablet, Take 1 tablet by mouth daily., Disp: , Rfl:  .  hydrochlorothiazide (MICROZIDE) 12.5 MG capsule, Take 1 capsule (12.5 mg total) by mouth daily., Disp: 90 capsule, Rfl:  1 .  lisdexamfetamine (VYVANSE) 40 MG capsule, Take 1 capsule (40 mg total) by mouth every morning., Disp: 30 capsule, Rfl: 0 .  Magnesium Glycinate POWD, 400 mg by Does not apply route daily., Disp: 100 g, Rfl: 0 .  Melatonin 5 MG TABS, Take 10 mg by mouth at bedtime. , Disp: , Rfl:  .  metFORMIN (GLUCOPHAGE) 500 MG tablet, Take 1 tablet (500 mg total) by mouth daily with breakfast., Disp: 30 tablet, Rfl: 0 .  Multiple Vitamin (MULTIVITAMIN) tablet, Take 1 tablet by mouth daily., Disp: , Rfl:  .  nebivolol (BYSTOLIC) 5 MG tablet, TAKE 1 TABLET BY MOUTH DAILY, Disp: 90 tablet, Rfl: 1 .  sertraline (ZOLOFT) 100 MG tablet, Take 1 tablet (100 mg total) by mouth daily., Disp: 90 tablet, Rfl: 1 .  valACYclovir (VALTREX) 500 MG tablet, Take 500 mg by mouth daily as needed., Disp: , Rfl:  .  VITAMIN D, CHOLECALCIFEROL, PO, Take 5,000 Units by mouth daily., Disp: , Rfl:    No Known Allergies   Review of Systems  Constitutional: Negative.   Respiratory: Negative.   Cardiovascular: Negative.   Gastrointestinal: Negative.   Neurological: Negative.   Psychiatric/Behavioral: Negative.  Today's Vitals   05/30/19 1555  BP: 126/70  Pulse: 79  Temp: 97.8 F (36.6 C)  TempSrc: Oral  Weight: 286 lb 6.4 oz (129.9 kg)  Height: 5\' 2"  (1.575 m)   Body mass index is 52.38 kg/m.   Objective:  Physical Exam Vitals signs and nursing note reviewed.  Constitutional:      Appearance: Normal appearance. She is obese.  HENT:     Head: Normocephalic and atraumatic.  Cardiovascular:     Rate and Rhythm: Normal rate and regular rhythm.     Heart sounds: Normal heart sounds.  Pulmonary:     Effort: Pulmonary effort is normal.     Breath sounds: Normal breath sounds.  Skin:    General: Skin is warm.  Neurological:     General: No focal deficit present.     Mental Status: She is alert.  Psychiatric:        Mood and Affect: Mood normal.        Behavior: Behavior normal.         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 PMDP reviewed. She was given refill of Vyvanse. Previous UDS reviewed. She will rto in 3 months for re-evaluation.   - lisdexamfetamine (VYVANSE) 40 MG capsule; Take 1 capsule (40 mg total) by mouth every morning.  Dispense: 30 capsule; Refill: 0  2. Essential hypertension, benign  Chronic, well controlled. She will continue with current meds. She is encouraged to avoid adding salt to her foods.   3. Class 3 severe obesity due to excess calories with serious comorbidity and body mass index (BMI) of 50.0 to 59.9 in adult [facility])  She plans to continue going to [facility]. She is encouraged to use vibration plate more frequently as well. She is encouraged to aim for 150 minutes per week of regular exercise.   Maximino Greenland, MD    THE PATIENT IS ENCOURAGED TO PRACTICE SOCIAL DISTANCING DUE TO THE COVID-19 PANDEMIC.

## 2019-06-09 ENCOUNTER — Encounter: Payer: Self-pay | Admitting: Internal Medicine

## 2019-06-10 ENCOUNTER — Telehealth: Payer: Self-pay

## 2019-06-10 NOTE — Telephone Encounter (Signed)
Prior authorization done for the pt's vyvanse 40 mg.  Waiting for response from the pt's insurance company.

## 2019-06-11 ENCOUNTER — Encounter (INDEPENDENT_AMBULATORY_CARE_PROVIDER_SITE_OTHER): Payer: Self-pay | Admitting: Physician Assistant

## 2019-06-11 ENCOUNTER — Telehealth (INDEPENDENT_AMBULATORY_CARE_PROVIDER_SITE_OTHER): Payer: BC Managed Care – PPO | Admitting: Physician Assistant

## 2019-06-11 ENCOUNTER — Other Ambulatory Visit: Payer: Self-pay

## 2019-06-11 DIAGNOSIS — Z6841 Body Mass Index (BMI) 40.0 and over, adult: Secondary | ICD-10-CM | POA: Diagnosis not present

## 2019-06-11 DIAGNOSIS — G4730 Sleep apnea, unspecified: Secondary | ICD-10-CM

## 2019-06-12 ENCOUNTER — Ambulatory Visit: Payer: BC Managed Care – PPO | Admitting: Internal Medicine

## 2019-06-16 NOTE — Progress Notes (Signed)
Office: [phone]  /  Fax: [phone] TeleHealth Visit:  Brooke [name] has verbally consented to this TeleHealth visit today. The patient is located at work, the provider is located at the [employer] [facility] office. The participants in this visit include the listed provider and patient and any and all parties involved. The visit was conducted today via WebEx.  HPI:  Chief Complaint: OBESITY Brooke [name] is here to discuss her progress with her obesity treatment plan. She is on the Category 2 plan and states she is following her eating plan approximately 70 % of the time. She states she is exercising 0 minutes 0 times per week.  Brooke [name]'s most recent weight is 285 pounds (06/11/19). Patient states that she tried to be a little more prepared for her meals. She is not hungry in the morning for breakfast, but she is eating eggs, cheese toast and yogurt, but she sometimes skips lunch. However she notes that some days, that she is getting in three meals.  Obstructive Sleep Apnea Brooke [name] has a diagnosis of obstructive sleep apnea and she is now using CPAP as prescribed. She is going to get an appointment with her neurologist to ensure she is on the correct setting and has a correct fitting mask.  ASSESSMENT AND PLAN:  Sleep apnea in adult  Class 3 severe obesity with serious comorbidity and body mass index (BMI) of 40.0 to 44.9 in adult, unspecified obesity type Brooke [name])  PLAN:  Obstructive Sleep Apnea Brooke [name] will follow up with neurology. She will follow up with our clinic in 3 weeks.  Obesity Brooke [name] is currently in the action stage of change. As such, her goal is to continue with weight loss efforts She has agreed to keep a food journal with 400 to 500 calories and 35 grams of protein at supper daily and follow the Category 2 plan Brooke [name] has been instructed to work up to a goal of 150 minutes of combined cardio and strengthening exercise per week for weight loss  and overall health benefits. We discussed the following Behavioral Modification Strategies today: no skipping meals and increasing lean protein intake  Brooke [name] has agreed to follow up with our clinic in 3 weeks. She was informed of the importance of frequent follow up visits to maximize her success with intensive lifestyle modifications for her multiple health conditions.  I spent > than 50% of the 28 minute visit on counseling as documented in the note.    ALLERGIES: No Known Allergies  MEDICATIONS: Current Outpatient Medications on File Prior to Visit  Medication Sig Dispense Refill  . albuterol (VENTOLIN HFA) 108 (90 Base) MCG/ACT inhaler Inhale 2 puffs into the lungs every 6 (six) hours as needed for wheezing or shortness of breath. 18 g 1  . calcium carbonate (OSCAL) 1500 (600 Ca) MG TABS tablet Take 1 tablet by mouth daily.    . hydrochlorothiazide (MICROZIDE) 12.5 MG capsule Take 1 capsule (12.5 mg total) by mouth daily. 90 capsule 1  . lisdexamfetamine (VYVANSE) 40 MG capsule Take 1 capsule (40 mg total) by mouth every morning. 30 capsule 0  . Magnesium Glycinate POWD 400 mg by Does not apply route daily. 100 g 0  . Melatonin 5 MG TABS Take 10 mg by mouth at bedtime.     . metFORMIN (GLUCOPHAGE) 500 MG tablet Take 1 tablet (500 mg total) by mouth daily with breakfast. 30 tablet 0  . Multiple Vitamin (MULTIVITAMIN) tablet Take 1 tablet by mouth daily.    . nebivolol (  BYSTOLIC) 5 MG tablet TAKE 1 TABLET BY MOUTH DAILY 90 tablet 1  . sertraline (ZOLOFT) 100 MG tablet Take 1 tablet (100 mg total) by mouth daily. 90 tablet 1  . valACYclovir (VALTREX) 500 MG tablet Take 500 mg by mouth daily as needed.    Marland Kitchen VITAMIN D, CHOLECALCIFEROL, PO Take 5,000 Units by mouth daily.     No current facility-administered medications on file prior to visit.    PAST MEDICAL HISTORY: Past Medical History:  Diagnosis Date  . ADD (attention deficit disorder)   . Allergy    allergic rhinitis  .  Anemia    nos  . Anxiety   . Arthritis    right ankle  . Asthma    as a child  . Back pain   . Constipation   . Depression   . Elevated blood pressure reading without diagnosis of hypertension   . Genital herpes   . Lactose intolerance   . Migraine   . Prediabetes   . Sleep apnea   . Stress   . Swelling     PAST SURGICAL HISTORY: Past Surgical History:  Procedure Laterality Date  . ANKLE SURGERY      SOCIAL HISTORY: Social History   Tobacco Use  . Smoking status: Never Smoker  . Smokeless tobacco: Never Used  Substance Use Topics  . Alcohol use: No  . Drug use: No    FAMILY HISTORY: Family History  Problem Relation Age of Onset  . Hypertension Mother   . Diabetes Mother   . Hyperlipidemia Mother   . Hypertension Father   . Sleep apnea Father   . Alcoholism Father   . Obesity Father   . Hypertension Other   . Diabetes Maternal Aunt   . Diabetes Maternal Uncle   . Heart failure Paternal Aunt   . Diabetes Paternal Aunt   . Heart failure Maternal Grandmother   . Heart failure Maternal Grandfather   . Breast cancer Sister     ROS: Review of Systems  Constitutional: Positive for weight loss.    PHYSICAL EXAM: There were no vitals taken for this visit. There is no height or weight on file to calculate BMI. Physical Exam Vitals reviewed.  Constitutional:      General: She is not in acute distress.    Appearance: Normal appearance. She is well-developed. She is obese.  Cardiovascular:     Rate and Rhythm: Normal rate.  Pulmonary:     Effort: Pulmonary effort is normal.  Musculoskeletal:        General: Normal range of motion.  Skin:    General: Skin is warm and dry.  Neurological:     Mental Status: She is alert and oriented to person, place, and time.  Psychiatric:        Mood and Affect: Mood normal.        Behavior: Behavior normal.     RECENT LABS AND TESTS: BMET    Component Value Date/Time   NA 136 03/10/2019 1032   K 4.2  03/10/2019 1032   CL 98 03/10/2019 1032   CO2 26 03/10/2019 1032   GLUCOSE 83 03/10/2019 1032   GLUCOSE 74 04/03/2016 1017   BUN 11 03/10/2019 1032   CREATININE 0.76 03/10/2019 1032   CREATININE 0.74 04/03/2016 1017   CALCIUM 10.1 03/10/2019 1032   GFRNONAA 93 03/10/2019 1032   GFRAA 107 03/10/2019 1032   Lab Results  Component Value Date   HGBA1C 5.7 (H) 03/10/2019  HGBA1C 5.8 (H) 01/06/2019   HGBA1C 5.6 07/17/2018   HGBA1C 5.6 04/26/2018   HGBA1C 6.0 (H) 02/08/2011   Lab Results  Component Value Date   INSULIN 16.6 01/06/2019   INSULIN 7.3 07/17/2018   CBC    Component Value Date/Time   WBC 8.3 03/10/2019 1032   WBC 7.4 06/30/2011 1631   RBC 4.96 03/10/2019 1032   RBC 4.79 06/30/2011 1631   HGB 12.5 03/10/2019 1032   HCT 39.1 03/10/2019 1032   PLT 386 03/10/2019 1032   MCV 79 03/10/2019 1032   MCH 25.2 (L) 03/10/2019 1032   MCH 24.0 (L) 06/30/2011 1631   MCHC 32.0 03/10/2019 1032   MCHC 31.3 06/30/2011 1631   RDW 15.1 03/10/2019 1032   LYMPHSABS 2.0 07/17/2018 1313   MONOABS 0.5 06/30/2011 1631   EOSABS 0.1 07/17/2018 1313   BASOSABS 0.1 07/17/2018 1313   Iron/TIBC/Ferritin/ %Sat    Component Value Date/Time   IRON 41 (L) 12/17/2012 1440   FERRITIN 22.8 12/17/2012 1440   IRONPCTSAT 11.2 (L) 12/17/2012 1440   Lipid Panel     Component Value Date/Time   CHOL 181 07/17/2018 1313   TRIG 44 07/17/2018 1313   HDL 59 07/17/2018 1313   CHOLHDL 3.3 02/08/2011 1035   VLDL 8 02/08/2011 1035   LDLCALC 113 (H) 07/17/2018 1313   Hepatic Function Panel     Component Value Date/Time   PROT 7.4 03/10/2019 1032   ALBUMIN 4.0 03/10/2019 1032   AST 16 03/10/2019 1032   ALT 15 03/10/2019 1032   ALKPHOS 114 03/10/2019 1032   BILITOT 0.3 03/10/2019 1032   BILIDIR 0.1 02/08/2011 1035   IBILI 0.3 02/08/2011 1035      Component Value Date/Time   TSH 0.935 07/17/2018 1313   TSH 1.30 04/03/2016 1017   TSH 1.023 06/30/2011 1631     Ref. Range 01/06/2019 10:08    Vitamin D, 25-Hydroxy Latest Ref Range: 30.0 - 100.0 ng/mL 47.2    I, Doreene Nest, am acting as Location manager for Abby Potash, PA-C I, Abby Potash, PA-C have reviewed above note and agree with its content

## 2019-07-02 ENCOUNTER — Encounter (INDEPENDENT_AMBULATORY_CARE_PROVIDER_SITE_OTHER): Payer: Self-pay | Admitting: Physician Assistant

## 2019-07-02 ENCOUNTER — Other Ambulatory Visit: Payer: Self-pay

## 2019-07-02 ENCOUNTER — Ambulatory Visit (INDEPENDENT_AMBULATORY_CARE_PROVIDER_SITE_OTHER): Payer: BC Managed Care – PPO | Admitting: Physician Assistant

## 2019-07-02 VITALS — BP 143/85 | HR 73 | Temp 98.1°F | Ht 62.0 in | Wt 281.0 lb

## 2019-07-02 DIAGNOSIS — Z6841 Body Mass Index (BMI) 40.0 and over, adult: Secondary | ICD-10-CM

## 2019-07-02 DIAGNOSIS — E559 Vitamin D deficiency, unspecified: Secondary | ICD-10-CM

## 2019-07-02 DIAGNOSIS — R7303 Prediabetes: Secondary | ICD-10-CM

## 2019-07-02 DIAGNOSIS — Z9189 Other specified personal risk factors, not elsewhere classified: Secondary | ICD-10-CM | POA: Diagnosis not present

## 2019-07-02 DIAGNOSIS — E7849 Other hyperlipidemia: Secondary | ICD-10-CM | POA: Diagnosis not present

## 2019-07-03 LAB — COMPREHENSIVE METABOLIC PANEL
ALT: 28 IU/L (ref 0–32)
AST: 22 IU/L (ref 0–40)
Albumin/Globulin Ratio: 1.1 — ABNORMAL LOW (ref 1.2–2.2)
Albumin: 3.8 g/dL (ref 3.8–4.8)
Alkaline Phosphatase: 110 IU/L (ref 39–117)
BUN/Creatinine Ratio: 15 (ref 9–23)
BUN: 11 mg/dL (ref 6–24)
Bilirubin Total: 0.2 mg/dL (ref 0.0–1.2)
CO2: 26 mmol/L (ref 20–29)
Calcium: 9.3 mg/dL (ref 8.7–10.2)
Chloride: 101 mmol/L (ref 96–106)
Creatinine, Ser: 0.72 mg/dL (ref 0.57–1.00)
GFR calc Af Amer: 115 mL/min/{1.73_m2} (ref >59)
GFR calc non Af Amer: 99 mL/min/{1.73_m2} (ref >59)
Globulin, Total: 3.4 g/dL (ref 1.5–4.5)
Glucose: 91 mg/dL (ref 65–99)
Potassium: 4.3 mmol/L (ref 3.5–5.2)
Sodium: 139 mmol/L (ref 134–144)
Total Protein: 7.2 g/dL (ref 6.0–8.5)

## 2019-07-03 LAB — HEMOGLOBIN A1C
Est. average glucose Bld gHb Est-mCnc: 114 mg/dL
Hgb A1c MFr Bld: 5.6 % (ref 4.8–5.6)

## 2019-07-03 LAB — VITAMIN D 25 HYDROXY (VIT D DEFICIENCY, FRACTURES): Vit D, 25-Hydroxy: 49.4 ng/mL (ref 30.0–100.0)

## 2019-07-03 LAB — LIPID PANEL WITH LDL/HDL RATIO
Cholesterol, Total: 192 mg/dL (ref 100–199)
HDL: 52 mg/dL (ref >39)
LDL Chol Calc (NIH): 131 mg/dL — ABNORMAL HIGH (ref 0–99)
LDL/HDL Ratio: 2.5 ratio (ref 0.0–3.2)
Triglycerides: 45 mg/dL (ref 0–149)
VLDL Cholesterol Cal: 9 mg/dL (ref 5–40)

## 2019-07-03 LAB — INSULIN, RANDOM: INSULIN: 8.3 u[IU]/mL (ref 2.6–24.9)

## 2019-07-07 NOTE — Progress Notes (Signed)
Chief Complaint:   OBESITY Brooke [name] is here to discuss her progress with her obesity treatment plan along with follow-up of her obesity related diagnoses. Brooke [name] is keeping a food journal and adhering to recommended goals of 1200 calories and 85 grams of protein daily and states she is following her eating plan approximately 60 to 70% of the time. Brooke [name] states she is doing cardio exercise 15 minutes 3 times per week.  Today's visit was #: 69 Starting weight: 281 lbs Starting date: 07/17/2018 Today's weight: 281 lbs Today's date: 07/02/2019 Total lbs lost to date: 0 Total lbs lost since last in-office visit: 5  Interim History: Trene states that she did a better job with meal planning and she really worked to not skip meals. She just got a new puppy and therefore she is getting some walking in during the day.  Subjective:   Vitamin D deficiency Brooke [name] is on OTC vitamin D and she has no nausea, vomiting or muscle weakness. She is now walking outside daily. Her last vitamin D level was at 47.2 (01/06/19). Brooke [name] is due for labs.  Prediabetes Brooke [name] is on metformin and she has no nausea, vomiting, diarrhea or polyphagia. Her last A1c was at 5.7 (03/10/19). She is due for labs.  Hyperlipidemia (chronic) Brooke [name] has chronic hyperlipidemia. She is not on medications. Brooke [name] has no chest pain. She just started walking regularly, due to getting a new puppy.  At risk for cardiovascular disease Brooke [name] is at a higher than average risk for cardiovascular disease due to obesity, prediabetes and hyperlipidemia. Reviewed: no chest pain on exertion, no dyspnea on exertion, and no swelling of ankles.  Assessment/Plan:   Vitamin D deficiency Low Vitamin D level contributes to fatigue and are associated with obesity, breast, and colon cancer. She will continue taking OTC vitamin D supplementation and she will follow-up for routine testing of vitamin D, at least  2-3 times per year to avoid over-replacement. We will check labs and Brooke [name] will follow up as directed.  Prediabetes Brooke [name] will continue with medications, weight loss and exercise. She will continue to work on weight loss, exercise, and decreasing simple carbohydrates to help decrease the risk of diabetes. We will check labs today.  Hyperlipidemia (chronic) Cardiovascular risk and specific lipid/LDL goals reviewed. We discussed several lifestyle modifications today and Brooke [name] will continue to work on diet, exercise and weight loss efforts. Orders and follow up as documented in patient record.   Counseling Intensive lifestyle modifications are the first line treatment for this issue. . Dietary changes: Increase soluble fiber. Decrease simple carbohydrates. . Exercise changes: Moderate to vigorous-intensity aerobic activity 150 minutes per week if tolerated. . Lipid-lowering medications: see documented in medical record.  At risk for cardiovascular disease Brooke [name] was given approximately 15 minutes of coronary artery disease prevention counseling today. She is 49 y.o. female and has risk factors for heart disease including obesity, prediabetes and hyperlipidemia. We discussed intensive lifestyle modifications today with an emphasis on specific weight loss instructions and strategies.   Brooke [name] is currently in the action stage of change. As such, her goal is to continue with weight loss efforts. She has agreed to keeping a food journal and adhering to recommended goals of 1200 calories and 85 grams of protein daily.   We discussed the following exercise goals today: For substantial health benefits, adults should do at least 150 minutes (2 hours and 30 minutes) a week of moderate-intensity, or 75 minutes (1 hour and 15 minutes)  a week of vigorous-intensity aerobic physical activity, or an equivalent combination of moderate- and vigorous-intensity aerobic activity. Aerobic activity should  be performed in episodes of at least 10 minutes, and preferably, it should be spread throughout the week. Adults should also include muscle-strengthening activities that involve all major muscle groups on 2 or more days a week.  We discussed the following behavioral modification strategies today: no skipping meals and meal planning and cooking strategies.  Brooke [name] has agreed to follow-up with our clinic in 2 weeks. She was informed of the importance of frequent follow-up visits to maximize her success with intensive lifestyle modifications for her multiple health conditions.   Brooke [name] was informed we would discuss her lab results at her next visit unless there is a critical issue that needs to be addressed sooner. Brooke [name] agreed to keep her next visit at the agreed upon time to discuss these results.  Objective:   Blood pressure (!) 143/85, pulse 73, temperature 98.1 F (36.7 C), temperature source Oral, height 5\' 2"  (1.575 m), weight 281 lb (127.5 kg), SpO2 97 %. Body mass index is 51.4 kg/m.  General: Cooperative, alert, well developed, in no acute distress. HEENT: Conjunctivae and lids unremarkable. Neck: No thyromegaly.  Cardiovascular: Regular rhythm.  Lungs: Normal work of breathing. Extremities: No edema.  Neurologic: No focal deficits.   Lab Results  Component Value Date   CREATININE 0.72 07/02/2019   BUN 11 07/02/2019   NA 139 07/02/2019   K 4.3 07/02/2019   CL 101 07/02/2019   CO2 26 07/02/2019   Lab Results  Component Value Date   ALT 28 07/02/2019   AST 22 07/02/2019   ALKPHOS 110 07/02/2019   BILITOT 0.2 07/02/2019   Lab Results  Component Value Date   HGBA1C 5.6 07/02/2019   HGBA1C 5.7 (H) 03/10/2019   HGBA1C 5.8 (H) 01/06/2019   HGBA1C 5.6 07/17/2018   HGBA1C 5.6 04/26/2018   Lab Results  Component Value Date   INSULIN 8.3 07/02/2019   INSULIN 16.6 01/06/2019   INSULIN 7.3 07/17/2018   Lab Results  Component Value Date   TSH 0.935  07/17/2018   Lab Results  Component Value Date   CHOL 192 07/02/2019   HDL 52 07/02/2019   LDLCALC 131 (H) 07/02/2019   TRIG 45 07/02/2019   CHOLHDL 3.3 02/08/2011   Lab Results  Component Value Date   WBC 8.3 03/10/2019   HGB 12.5 03/10/2019   HCT 39.1 03/10/2019   MCV 79 03/10/2019   PLT 386 03/10/2019   Lab Results  Component Value Date   IRON 41 (L) 12/17/2012   FERRITIN 22.8 12/17/2012    Ref. Range 01/06/2019 10:08  Vitamin D, 25-Hydroxy Latest Ref Range: 30.0 - 100.0 ng/mL 47.2    Obesity Behavioral Intervention Documentation for Insurance:   Approximately 15 minutes were spent on the discussion below.  ASK: We discussed the diagnosis of obesity with Brooke [name] today and Brooke [name] agreed to give [place] permission to discuss obesity behavioral modification therapy today.  ASSESS: Brooke [name] has the diagnosis of obesity and her BMI today is 51.38. Brooke [name] is in the action stage of change.   ADVISE: Brooke [name] was educated on the multiple health risks of obesity as well as the benefit of weight loss to improve her health. She was advised of the need for long term treatment and the importance of lifestyle modifications to improve her current health and to decrease her risk of future health problems.  AGREE: Multiple dietary  modification options and treatment options were discussed and Brooke [name] agreed to follow the recommendations documented in the above note.  ARRANGE: Brooke [name] was educated on the importance of frequent visits to treat obesity as outlined per CMS and USPSTF guidelines and agreed to schedule her next follow up appointment today.  Attestation Statements:   Reviewed by clinician on day of visit: allergies, medications, problem list, medical history, surgical history, family history, social history, and previous encounter notes.  This visit occurred during the SARS-CoV-2 public health emergency. Safety protocols were in place, including screening  questions prior to the visit, additional usage of staff PPE, and extensive cleaning of exam room while observing appropriate contact time as indicated for disinfecting solutions. (CPT [id number])  I, Doreene Nest, am acting as transcriptionist for [employer].  I have reviewed the above documentation for accuracy and completeness, and I agree with the above. Abby Potash, PA-C

## 2019-07-16 ENCOUNTER — Ambulatory Visit (INDEPENDENT_AMBULATORY_CARE_PROVIDER_SITE_OTHER): Payer: BC Managed Care – PPO | Admitting: Physician Assistant

## 2019-07-21 ENCOUNTER — Encounter (INDEPENDENT_AMBULATORY_CARE_PROVIDER_SITE_OTHER): Payer: Self-pay | Admitting: Physician Assistant

## 2019-07-21 ENCOUNTER — Ambulatory Visit (INDEPENDENT_AMBULATORY_CARE_PROVIDER_SITE_OTHER): Payer: BC Managed Care – PPO | Admitting: Physician Assistant

## 2019-07-21 ENCOUNTER — Other Ambulatory Visit: Payer: Self-pay

## 2019-07-21 VITALS — BP 130/84 | HR 74 | Temp 98.3°F | Ht 62.0 in | Wt 282.0 lb

## 2019-07-21 DIAGNOSIS — E7849 Other hyperlipidemia: Secondary | ICD-10-CM

## 2019-07-21 DIAGNOSIS — Z6841 Body Mass Index (BMI) 40.0 and over, adult: Secondary | ICD-10-CM | POA: Diagnosis not present

## 2019-07-21 NOTE — Progress Notes (Signed)
Chief Complaint:   Brooke [name] is here to discuss her progress with her Brooke treatment plan along with follow-up of her Brooke related diagnoses. Brooke [name] is on the Category 2 Plan and states she is following her eating plan approximately 50% of the time. Brooke [name] states she is exercising 0 minutes 0 times per week.  Today's visit was #: 20 Starting weight: 281 lbs Starting date: 07/17/2018 Today's weight: 282 lbs Today's date: 07/21/2019 Total lbs lost to date: 0 Total lbs lost since last in-office visit: 0  Interim History: Brooke [name] reports that she has "been lazy" when it comes to eating recently. She is frustrated with herself because she feels like she should be doing better with the plan.  Subjective:   Other hyperlipidemia. Brooke [name] is on no medications. Patient denies chest pain. She is not exercising.   Lab Results  Component Value Date   CHOL 192 07/02/2019   HDL 52 07/02/2019   LDLCALC 131 (H) 07/02/2019   TRIG 45 07/02/2019   CHOLHDL 3.3 02/08/2011   Lab Results  Component Value Date   ALT 28 07/02/2019   AST 22 07/02/2019   ALKPHOS 110 07/02/2019   BILITOT 0.2 07/02/2019   The 10-year ASCVD risk score Mikey Bussing DC Jr., et al., 2013) is: 3.3%   Values used to calculate the score:     Age: 49 years     Sex: Female     Is Non-Hispanic African American: Yes     Diabetic: No     Tobacco smoker: No     Systolic Blood Pressure: [id number]     Is BP treated: Yes     HDL Cholesterol: 52 mg/dL     Total Cholesterol: 192 mg/dL  Assessment/Plan:   Other hyperlipidemia. Cardiovascular risk and specific lipid/LDL goals reviewed.  We discussed several lifestyle modifications today and Tamariah will continue to work on diet, exercise and weight loss efforts. Orders and follow up as documented in patient record.   Counseling Intensive lifestyle modifications are the first line treatment for this issue.  Dietary changes: Increase soluble fiber.  Decrease simple carbohydrates.  Exercise changes: Moderate to vigorous-intensity aerobic activity 150 minutes per week if tolerated. Lipid-lowering medications: see documented in medical record.  Class 3 severe Brooke with serious comorbidity and body mass index (BMI) of 50.0 to 59.9 in adult, unspecified Brooke type ([place]).  Brooke [name] is currently in the action stage of change. As such, her goal is to continue with weight loss efforts. She has agreed to the Category 2 Plan.   Exercise goals: For substantial health benefits, adults should do at least 150 minutes (2 hours and 30 minutes) a week of moderate-intensity, or 75 minutes (1 hour and 15 minutes) a week of vigorous-intensity aerobic physical activity, or an equivalent combination of moderate- and vigorous-intensity aerobic activity. Aerobic activity should be performed in episodes of at least 10 minutes, and preferably, it should be spread throughout the week. Adults should also include muscle-strengthening activities that involve all major muscle groups on 2 or more days a week.  Behavioral modification strategies: no skipping meals and meal planning and cooking strategies.  Brooke [name] has agreed to follow-up with our clinic in 2 weeks. She was informed of the importance of frequent follow-up visits to maximize her success with intensive lifestyle modifications for her multiple health conditions.   Objective:   Blood pressure 130/84, pulse 74, temperature 98.3 F (36.8 C), temperature source Oral, height 5\' 2"  (1.575 m), weight  282 lb (127.9 kg), SpO2 98 %. Body mass index is 51.58 kg/m.  General: Cooperative, alert, well developed, in no acute distress. HEENT: Conjunctivae and lids unremarkable. Cardiovascular: Regular rhythm.  Lungs: Normal work of breathing. Neurologic: No focal deficits.   Lab Results  Component Value Date   CREATININE 0.72 07/02/2019   BUN 11 07/02/2019   NA 139 07/02/2019   K 4.3 07/02/2019   CL 101  07/02/2019   CO2 26 07/02/2019   Lab Results  Component Value Date   ALT 28 07/02/2019   AST 22 07/02/2019   ALKPHOS 110 07/02/2019   BILITOT 0.2 07/02/2019   Lab Results  Component Value Date   HGBA1C 5.6 07/02/2019   HGBA1C 5.7 (H) 03/10/2019   HGBA1C 5.8 (H) 01/06/2019   HGBA1C 5.6 07/17/2018   HGBA1C 5.6 04/26/2018   Lab Results  Component Value Date   INSULIN 8.3 07/02/2019   INSULIN 16.6 01/06/2019   INSULIN 7.3 07/17/2018   Lab Results  Component Value Date   TSH 0.935 07/17/2018   Lab Results  Component Value Date   CHOL 192 07/02/2019   HDL 52 07/02/2019   LDLCALC 131 (H) 07/02/2019   TRIG 45 07/02/2019   CHOLHDL 3.3 02/08/2011   Lab Results  Component Value Date   WBC 8.3 03/10/2019   HGB 12.5 03/10/2019   HCT 39.1 03/10/2019   MCV 79 03/10/2019   PLT 386 03/10/2019   Lab Results  Component Value Date   IRON 41 (L) 12/17/2012   FERRITIN 22.8 12/17/2012   Attestation Statements:   Reviewed by clinician on day of visit: allergies, medications, problem list, medical history, surgical history, family history, social history, and previous encounter notes.  Time spent on visit including pre-visit chart review and post-visit care was 31 minutes.   IMichaelene Song, am acting as transcriptionist for Abby Potash, PA-C   I have reviewed the above documentation for accuracy and completeness, and I agree with the above. Abby Potash, PA-C

## 2019-07-22 ENCOUNTER — Encounter (INDEPENDENT_AMBULATORY_CARE_PROVIDER_SITE_OTHER): Payer: Self-pay | Admitting: Physician Assistant

## 2019-07-22 ENCOUNTER — Encounter: Payer: Self-pay | Admitting: Internal Medicine

## 2019-07-22 NOTE — Telephone Encounter (Signed)
Please advise 

## 2019-07-23 ENCOUNTER — Other Ambulatory Visit (INDEPENDENT_AMBULATORY_CARE_PROVIDER_SITE_OTHER): Payer: Self-pay

## 2019-07-23 MED ORDER — SERTRALINE HCL 100 MG PO TABS: 100.0000 mg | ORAL_TABLET | Freq: Every day | ORAL | 0 refills | Status: DC

## 2019-07-23 NOTE — Telephone Encounter (Signed)
Send 30 days worth please, as it looks like her PCP normally prescribes this. Thanks

## 2019-07-25 ENCOUNTER — Other Ambulatory Visit: Payer: Self-pay | Admitting: Internal Medicine

## 2019-07-25 ENCOUNTER — Encounter: Payer: Self-pay | Admitting: Internal Medicine

## 2019-07-25 DIAGNOSIS — F988 Other specified behavioral and emotional disorders with onset usually occurring in childhood and adolescence: Secondary | ICD-10-CM

## 2019-07-25 MED ORDER — LISDEXAMFETAMINE DIMESYLATE 40 MG PO CAPS: 40.0000 mg | ORAL_CAPSULE | ORAL | 0 refills | Status: DC

## 2019-07-30 ENCOUNTER — Ambulatory Visit (INDEPENDENT_AMBULATORY_CARE_PROVIDER_SITE_OTHER): Payer: BC Managed Care – PPO | Admitting: Physician Assistant

## 2019-08-06 ENCOUNTER — Ambulatory Visit (INDEPENDENT_AMBULATORY_CARE_PROVIDER_SITE_OTHER): Payer: BC Managed Care – PPO | Admitting: Physician Assistant

## 2019-08-06 ENCOUNTER — Other Ambulatory Visit: Payer: Self-pay

## 2019-08-06 ENCOUNTER — Encounter (INDEPENDENT_AMBULATORY_CARE_PROVIDER_SITE_OTHER): Payer: Self-pay | Admitting: Physician Assistant

## 2019-08-06 VITALS — BP 131/89 | HR 79 | Temp 97.9°F | Ht 62.0 in | Wt 279.0 lb

## 2019-08-06 DIAGNOSIS — E559 Vitamin D deficiency, unspecified: Secondary | ICD-10-CM | POA: Diagnosis not present

## 2019-08-06 DIAGNOSIS — Z6841 Body Mass Index (BMI) 40.0 and over, adult: Secondary | ICD-10-CM

## 2019-08-06 NOTE — Progress Notes (Signed)
Chief Complaint:   OBESITY Brooke [name] is here to discuss her progress with her obesity treatment plan along with follow-up of her obesity related diagnoses. [name] is on the Category 2 Plan and states she is following her eating plan approximately 70% of the time. [name] states she is exercising 0 minutes 0 times per week.  Today's visit was #: 21 Starting weight: 281 lbs Starting date: 07/17/2018 Today's weight: 279 lbs Today's date: 08/06/2019 Total lbs lost to date: 2 Total lbs lost since last in-office visit: 3  Interim History: [name] states that she has not done a good job meal planning, but has been cooking more. She sometimes skips breakfast. She has done a better job overall the past 2 weeks.  Subjective:   Vitamin D deficiency. No nausea, vomiting, or muscle weakness. [name] is on OTC Vitamin D. Last Vitamin D level not at goal, 49.4 on 07/02/2019.  Assessment/Plan:   Vitamin D deficiency. Low Vitamin D level contributes to fatigue and are associated with obesity, breast, and colon cancer. She agrees to continue to take Vitamin D and will follow-up for routine testing of Vitamin D, at least 2-3 times per year to avoid over-replacement.  Class 3 severe obesity with serious comorbidity and body mass index (BMI) of 50.0 to 59.9 in adult, unspecified obesity type ([place]).  [name] is currently in the action stage of change. As such, her goal is to continue with weight loss efforts. She has agreed to the Category 2 Plan.   Exercise goals: For substantial health benefits, adults should do at least 150 minutes (2 hours and 30 minutes) a week of moderate-intensity, or 75 minutes (1 hour and 15 minutes) a week of vigorous-intensity aerobic physical activity, or an equivalent combination of moderate- and vigorous-intensity aerobic activity. Aerobic activity should be performed in episodes of at least 10 minutes, and preferably, it should be spread throughout the  week.  Behavioral modification strategies: no skipping meals and meal planning and cooking strategies.  [name] has agreed to follow-up with our clinic in 2 weeks. She was informed of the importance of frequent follow-up visits to maximize her success with intensive lifestyle modifications for her multiple health conditions.   Objective:   Blood pressure 131/89, pulse 79, temperature 97.9 F (36.6 C), temperature source Oral, height 5\' 2"  (1.575 m), weight 279 lb (126.6 kg), last menstrual period 07/02/2019, SpO2 98 %. Body mass index is 51.03 kg/m.  General: Cooperative, alert, well developed, in no acute distress. HEENT: Conjunctivae and lids unremarkable. Cardiovascular: Regular rhythm.  Lungs: Normal work of breathing. Neurologic: No focal deficits.   Lab Results  Component Value Date   CREATININE 0.72 07/02/2019   BUN 11 07/02/2019   NA 139 07/02/2019   K 4.3 07/02/2019   CL 101 07/02/2019   CO2 26 07/02/2019   Lab Results  Component Value Date   ALT 28 07/02/2019   AST 22 07/02/2019   ALKPHOS 110 07/02/2019   BILITOT 0.2 07/02/2019   Lab Results  Component Value Date   HGBA1C 5.6 07/02/2019   HGBA1C 5.7 (H) 03/10/2019   HGBA1C 5.8 (H) 01/06/2019   HGBA1C 5.6 07/17/2018   HGBA1C 5.6 04/26/2018   Lab Results  Component Value Date   INSULIN 8.3 07/02/2019   INSULIN 16.6 01/06/2019   INSULIN 7.3 07/17/2018   Lab Results  Component Value Date   TSH 0.935 07/17/2018   Lab Results  Component Value Date   CHOL 192 07/02/2019  HDL 52 07/02/2019   LDLCALC 131 (H) 07/02/2019   TRIG 45 07/02/2019   CHOLHDL 3.3 02/08/2011   Lab Results  Component Value Date   WBC 8.3 03/10/2019   HGB 12.5 03/10/2019   HCT 39.1 03/10/2019   MCV 79 03/10/2019   PLT 386 03/10/2019   Lab Results  Component Value Date   IRON 41 (L) 12/17/2012   FERRITIN 22.8 12/17/2012   Attestation Statements:   Reviewed by clinician on day of visit: allergies, medications, problem  list, medical history, surgical history, family history, social history, and previous encounter notes.  Time spent on visit including pre-visit chart review and post-visit care was 30 minutes.   IMichaelene Song, am acting as transcriptionist for Abby Potash, PA-C   I have reviewed the above documentation for accuracy and completeness, and I agree with the above. Abby Potash, PA-C

## 2019-08-13 ENCOUNTER — Ambulatory Visit (INDEPENDENT_AMBULATORY_CARE_PROVIDER_SITE_OTHER): Payer: BC Managed Care – PPO | Admitting: Physician Assistant

## 2019-08-20 ENCOUNTER — Ambulatory Visit (INDEPENDENT_AMBULATORY_CARE_PROVIDER_SITE_OTHER): Payer: BC Managed Care – PPO | Admitting: Physician Assistant

## 2019-08-20 ENCOUNTER — Encounter (INDEPENDENT_AMBULATORY_CARE_PROVIDER_SITE_OTHER): Payer: Self-pay | Admitting: Physician Assistant

## 2019-08-20 ENCOUNTER — Other Ambulatory Visit: Payer: Self-pay

## 2019-08-20 VITALS — BP 127/84 | HR 76 | Temp 98.1°F | Ht 62.0 in | Wt 280.0 lb

## 2019-08-20 DIAGNOSIS — F3289 Other specified depressive episodes: Secondary | ICD-10-CM | POA: Diagnosis not present

## 2019-08-20 DIAGNOSIS — Z6841 Body Mass Index (BMI) 40.0 and over, adult: Secondary | ICD-10-CM

## 2019-08-20 DIAGNOSIS — R7303 Prediabetes: Secondary | ICD-10-CM

## 2019-08-20 DIAGNOSIS — Z9189 Other specified personal risk factors, not elsewhere classified: Secondary | ICD-10-CM | POA: Diagnosis not present

## 2019-08-20 MED ORDER — METFORMIN HCL 500 MG PO TABS: 500.0000 mg | ORAL_TABLET | Freq: Every day | ORAL | 0 refills | Status: DC

## 2019-08-20 NOTE — Progress Notes (Signed)
Chief Complaint:   Brooke [name] is here to discuss her progress with her Brooke treatment plan along with follow-up of her Brooke related diagnoses. Brooke [name] is on the Category 2 Plan and states she is following her eating plan approximately 70% of the time. Brooke [name] states she is walking 15 minutes 7 times per week.  Today's visit was #: 22 Starting weight: 281 lbs Starting date: 07/17/2018 Today's weight: 280 lbs Today's date: 08/20/2019 Total lbs lost to date: 1 Total lbs lost since last in-office visit: 0  Interim History: Brooke [name] states that the kids are back in school and she is out of routine. She has been skipping meals here and there.  Subjective:   Prediabetes. Brooke [name] has a diagnosis of prediabetes based on her elevated HgA1c and was informed this puts her at greater risk of developing diabetes. She continues to work on diet and exercise to decrease her risk of diabetes. She denies nausea, vomiting, or diarrhea. Brooke [name] is on metformin once daily.   Lab Results  Component Value Date   HGBA1C 5.6 07/02/2019   Lab Results  Component Value Date   INSULIN 8.3 07/02/2019   INSULIN 16.6 01/06/2019   INSULIN 7.3 07/17/2018   Other depression, with emotional eating. Brooke [name] is struggling with emotional eating and using food for comfort to the extent that it is negatively impacting her health. She has been working on behavior modification techniques to help reduce her emotional eating and has been somewhat successful. She shows no sign of suicidal or homicidal ideations. Brooke [name] is on Zoloft. She is tearful and frustrated today with lack of weight loss. She is seeing a therapist.  At risk for diabetes mellitus. Brooke [name] is at higher than average risk for developing diabetes due to her Brooke.   Assessment/Plan:   Prediabetes. Brooke [name] will continue to work on weight loss, exercise, and decreasing simple carbohydrates to help decrease the risk  of diabetes. She was given a refill on her metFORMIN (GLUCOPHAGE) 500 MG tablet #30 with 0 refills.  Other depression, with emotional eating. Behavior modification techniques were discussed today to help Brooke [name] deal with her emotional/non-hunger eating behaviors.  Orders and follow up as documented in patient record. Brooke [name] will continue Zoloft as directed.  At risk for diabetes mellitus. Brooke [name] was given approximately 15 minutes of diabetes education and counseling today. We discussed intensive lifestyle modifications today with an emphasis on weight loss as well as increasing exercise and decreasing simple carbohydrates in her diet. We also reviewed medication options with an emphasis on risk versus benefit of those discussed.   Repetitive spaced learning was employed today to elicit superior memory formation and behavioral change.  Class 3 severe Brooke with serious comorbidity and body mass index (BMI) of 50.0 to 59.9 in adult, unspecified Brooke type (Brooke [name]).  Brooke [name] is currently in the action stage of change. As such, her goal is to continue with weight loss efforts. She has agreed to the Category 2 Plan.   Exercise goals: For substantial health benefits, adults should do at least 150 minutes (2 hours and 30 minutes) a week of moderate-intensity, or 75 minutes (1 hour and 15 minutes) a week of vigorous-intensity aerobic physical activity, or an equivalent combination of moderate- and vigorous-intensity aerobic activity. Aerobic activity should be performed in episodes of at least 10 minutes, and preferably, it should be spread throughout the week.  Behavioral modification strategies: no skipping meals and meal planning and cooking strategies.  Brooke [name] has agreed to  follow-up with our clinic in 2 weeks. She was informed of the importance of frequent follow-up visits to maximize her success with intensive lifestyle modifications for her multiple health conditions.   Objective:    Blood pressure 127/84, pulse 76, temperature 98.1 F (36.7 C), temperature source Oral, height 5\' 2"  (1.575 m), weight 280 lb (127 kg), SpO2 95 %. Body mass index is 51.21 kg/m.  General: Cooperative, alert, well developed, in no acute distress. HEENT: Conjunctivae and lids unremarkable. Cardiovascular: Regular rhythm.  Lungs: Normal work of breathing. Neurologic: No focal deficits.   Lab Results  Component Value Date   CREATININE 0.72 07/02/2019   BUN 11 07/02/2019   NA 139 07/02/2019   K 4.3 07/02/2019   CL 101 07/02/2019   CO2 26 07/02/2019   Lab Results  Component Value Date   ALT 28 07/02/2019   AST 22 07/02/2019   ALKPHOS 110 07/02/2019   BILITOT 0.2 07/02/2019   Lab Results  Component Value Date   HGBA1C 5.6 07/02/2019   HGBA1C 5.7 (H) 03/10/2019   HGBA1C 5.8 (H) 01/06/2019   HGBA1C 5.6 07/17/2018   HGBA1C 5.6 04/26/2018   Lab Results  Component Value Date   INSULIN 8.3 07/02/2019   INSULIN 16.6 01/06/2019   INSULIN 7.3 07/17/2018   Lab Results  Component Value Date   TSH 0.935 07/17/2018   Lab Results  Component Value Date   CHOL 192 07/02/2019   HDL 52 07/02/2019   LDLCALC 131 (H) 07/02/2019   TRIG 45 07/02/2019   CHOLHDL 3.3 02/08/2011   Lab Results  Component Value Date   WBC 8.3 03/10/2019   HGB 12.5 03/10/2019   HCT 39.1 03/10/2019   MCV 79 03/10/2019   PLT 386 03/10/2019   Lab Results  Component Value Date   IRON 41 (L) 12/17/2012   FERRITIN 22.8 12/17/2012   Attestation Statements:   Reviewed by clinician on day of visit: allergies, medications, problem list, medical history, surgical history, family history, social history, and previous encounter notes.  IMichaelene Song, am acting as transcriptionist for Abby Potash, PA-C   I have reviewed the above documentation for accuracy and completeness, and I agree with the above. Abby Potash, PA-C

## 2019-08-21 ENCOUNTER — Ambulatory Visit: Payer: BC Managed Care – PPO | Attending: Internal Medicine

## 2019-08-21 DIAGNOSIS — Z23 Encounter for immunization: Secondary | ICD-10-CM

## 2019-08-21 NOTE — Progress Notes (Signed)
   Covid-19 Vaccination Clinic  Name:  Brooke [name]    MRN: [id number] DOB: [birth year]  08/21/2019  [name] was observed post Covid-19 immunization for 15 minutes without incidence. She was provided with Vaccine Information Sheet and instruction to access the V-Safe system.   [name] was instructed to call 911 with any severe reactions post vaccine: Marland Kitchen Difficulty breathing  . Swelling of your face and throat  . A fast heartbeat  . A bad rash all over your body  . Dizziness and weakness    Immunizations Administered    Name Date Dose VIS Date Route   Pfizer COVID-19 Vaccine 08/21/2019  9:39 AM 0.3 mL 06/06/2019 Intramuscular   Manufacturer: [place]   Lot: [id number]   [place]: [id number]

## 2019-08-28 ENCOUNTER — Ambulatory Visit: Payer: BC Managed Care – PPO | Admitting: Internal Medicine

## 2019-08-28 ENCOUNTER — Other Ambulatory Visit: Payer: Self-pay

## 2019-08-28 ENCOUNTER — Encounter: Payer: Self-pay | Admitting: Internal Medicine

## 2019-08-28 VITALS — BP 134/96 | HR 73 | Temp 97.5°F | Ht 62.0 in | Wt 284.2 lb

## 2019-08-28 DIAGNOSIS — F988 Other specified behavioral and emotional disorders with onset usually occurring in childhood and adolescence: Secondary | ICD-10-CM

## 2019-08-28 DIAGNOSIS — I10 Essential (primary) hypertension: Secondary | ICD-10-CM

## 2019-08-28 DIAGNOSIS — Z6841 Body Mass Index (BMI) 40.0 and over, adult: Secondary | ICD-10-CM | POA: Diagnosis not present

## 2019-08-28 DIAGNOSIS — Z712 Person consulting for explanation of examination or test findings: Secondary | ICD-10-CM

## 2019-08-28 DIAGNOSIS — Z79899 Other long term (current) drug therapy: Secondary | ICD-10-CM

## 2019-08-28 MED ORDER — HYDROCHLOROTHIAZIDE 12.5 MG PO CAPS: 12.5000 mg | ORAL_CAPSULE | Freq: Every day | ORAL | 1 refills | Status: DC

## 2019-08-30 NOTE — Progress Notes (Signed)
This visit occurred during the SARS-CoV-2 public health emergency.  Safety protocols were in place, including screening questions prior to the visit, additional usage of staff PPE, and extensive cleaning of exam room while observing appropriate contact time as indicated for disinfecting solutions.  Subjective:     Patient ID: Brooke [name] , female    DOB: Mar 28, 1971 , 49 y.o.   MRN: [id number]   Chief Complaint  Patient presents with  . Hypertension  . ADHD    vyvanse refill    HPI  She is here today for BP check and f/u ADD. She feels well on both medications. She admits she is not exercising as much as she should.   Hypertension This is a chronic problem. The current episode started more than 1 year ago. The problem has been gradually improving since onset. The problem is controlled. Pertinent negatives include no blurred vision. Risk factors for coronary artery disease include obesity and sedentary lifestyle. The current treatment provides moderate improvement. Compliance problems include exercise.   Medication Refill     Past Medical History:  Diagnosis Date  . ADD (attention deficit disorder)   . Allergy    allergic rhinitis  . Anemia    nos  . Anxiety   . Arthritis    right ankle  . Asthma    as a child  . Back pain   . Constipation   . Depression   . Elevated blood pressure reading without diagnosis of hypertension   . Genital herpes   . Lactose intolerance   . Migraine   . Prediabetes   . Sleep apnea   . Stress   . Swelling      Family History  Problem Relation Age of Onset  . Hypertension Mother   . Diabetes Mother   . Hyperlipidemia Mother   . Hypertension Father   . Sleep apnea Father   . Alcoholism Father   . Obesity Father   . Hypertension Other   . Diabetes Maternal Aunt   . Diabetes Maternal Uncle   . Heart failure Paternal Aunt   . Diabetes Paternal Aunt   . Heart failure Maternal Grandmother   . Heart failure Maternal Grandfather    . Breast cancer Sister      Current Outpatient Medications:  .  calcium carbonate (OSCAL) 1500 (600 Ca) MG TABS tablet, Take 1 tablet by mouth daily., Disp: , Rfl:  .  lisdexamfetamine (VYVANSE) 40 MG capsule, Take 1 capsule (40 mg total) by mouth every morning., Disp: 30 capsule, Rfl: 0 .  Magnesium Glycinate POWD, 400 mg by Does not apply route daily., Disp: 100 g, Rfl: 0 .  Melatonin 5 MG TABS, Take 20 mg by mouth at bedtime. , Disp: , Rfl:  .  metFORMIN (GLUCOPHAGE) 500 MG tablet, Take 1 tablet (500 mg total) by mouth daily with breakfast., Disp: 30 tablet, Rfl: 0 .  Multiple Vitamin (MULTIVITAMIN) tablet, Take 1 tablet by mouth daily., Disp: , Rfl:  .  nebivolol (BYSTOLIC) 5 MG tablet, TAKE 1 TABLET BY MOUTH DAILY, Disp: 90 tablet, Rfl: 1 .  sertraline (ZOLOFT) 100 MG tablet, Take 1 tablet (100 mg total) by mouth daily., Disp: 30 tablet, Rfl: 0 .  valACYclovir (VALTREX) 500 MG tablet, Take 500 mg by mouth daily as needed., Disp: , Rfl:  .  VITAMIN D, CHOLECALCIFEROL, PO, Take 5,000 Units by mouth daily., Disp: , Rfl:  .  hydrochlorothiazide (MICROZIDE) 12.5 MG capsule, Take 1 capsule (12.5 mg total) by  mouth daily., Disp: 90 capsule, Rfl: 1   No Known Allergies   Review of Systems  Constitutional: Negative.   Eyes: Negative for blurred vision.  Respiratory: Negative.   Cardiovascular: Negative.   Gastrointestinal: Negative.   Neurological: Negative.   Psychiatric/Behavioral: Negative.      Today's Vitals   08/28/19 1548  BP: (!) 134/96  Pulse: 73  Temp: (!) 97.5 F (36.4 C)  TempSrc: Oral  Weight: 284 lb 3.2 oz (128.9 kg)  Height: 5\' 2"  (1.575 m)   Body mass index is 51.98 kg/m.   Objective:  Physical Exam Vitals and nursing note reviewed.  Constitutional:      Appearance: Normal appearance. She is obese.  HENT:     Head: Normocephalic and atraumatic.  Cardiovascular:     Rate and Rhythm: Normal rate and regular rhythm.     Heart sounds: Normal heart sounds.   Pulmonary:     Effort: Pulmonary effort is normal.     Breath sounds: Normal breath sounds.  Skin:    General: Skin is warm.  Neurological:     General: No focal deficit present.     Mental Status: She is alert.  Psychiatric:        Mood and Affect: Mood normal.        Behavior: Behavior normal.         Assessment And Plan:     1. Essential hypertension, benign  Chronic, fair control.  She will continue with current meds today. She is encouraged to restrict her salt intake.   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 I will check UDS today. She will rto in 3 months for re-evaluation.   3. Drug therapy  - Drug Profile, Ur, 9 Drugs  4. Class 3 severe obesity due to excess calories with serious comorbidity and body mass index (BMI) of 50.0 to 59.9 in adult ([facility])  BMI 51.  Her recent notes at [facility] clinic were reviewed. She does not feel that her participation in their program has been effective. We discussed other options that are available to her.  She will consider these in the future if necessary.  She is encouraged to strive for BMI less than 45 to decrease cardiac risk. Encouraged to incorporate more exercise into her daily routine.   I personally spent 25 minutes face-to-face and non-face-to-face in the care of this patient, which includes all pre-, intra-, and post visit time on the date of service.   Maximino Greenland, MD    THE PATIENT IS ENCOURAGED TO PRACTICE SOCIAL DISTANCING DUE TO THE COVID-19 PANDEMIC.

## 2019-09-03 ENCOUNTER — Ambulatory Visit (INDEPENDENT_AMBULATORY_CARE_PROVIDER_SITE_OTHER): Payer: BC Managed Care – PPO | Admitting: Physician Assistant

## 2019-09-04 ENCOUNTER — Encounter: Payer: Self-pay | Admitting: Internal Medicine

## 2019-09-04 ENCOUNTER — Other Ambulatory Visit: Payer: Self-pay | Admitting: Internal Medicine

## 2019-09-04 DIAGNOSIS — F988 Other specified behavioral and emotional disorders with onset usually occurring in childhood and adolescence: Secondary | ICD-10-CM

## 2019-09-04 MED ORDER — LISDEXAMFETAMINE DIMESYLATE 40 MG PO CAPS: 40.0000 mg | ORAL_CAPSULE | ORAL | 0 refills | Status: DC

## 2019-09-10 ENCOUNTER — Ambulatory Visit: Payer: BC Managed Care – PPO | Attending: Internal Medicine

## 2019-09-10 ENCOUNTER — Ambulatory Visit (INDEPENDENT_AMBULATORY_CARE_PROVIDER_SITE_OTHER): Payer: BC Managed Care – PPO | Admitting: Physician Assistant

## 2019-09-10 DIAGNOSIS — Z23 Encounter for immunization: Secondary | ICD-10-CM

## 2019-09-10 NOTE — Progress Notes (Signed)
   Covid-19 Vaccination Clinic  Name:  ADILEN HEYDER    MRN: [id number] DOB: 03-28-1971  09/10/2019  Ms. Yeh was observed post Covid-19 immunization for 15 minutes without incident. She was provided with Vaccine Information Sheet and instruction to access the V-Safe system.   Ms. Brener was instructed to call 911 with any severe reactions post vaccine: Marland Kitchen Difficulty breathing  . Swelling of face and throat  . A fast heartbeat  . A bad rash all over body  . Dizziness and weakness   Immunizations Administered    Name Date Dose VIS Date Route   Pfizer COVID-19 Vaccine 09/10/2019 11:58 AM 0.3 mL 06/06/2019 Intramuscular   Manufacturer: [place]   Lot: [id number]   [place]: [id number]

## 2019-09-11 ENCOUNTER — Other Ambulatory Visit: Payer: Self-pay | Admitting: Gastroenterology

## 2019-09-17 ENCOUNTER — Ambulatory Visit (INDEPENDENT_AMBULATORY_CARE_PROVIDER_SITE_OTHER): Payer: BC Managed Care – PPO | Admitting: Physician Assistant

## 2019-10-01 ENCOUNTER — Ambulatory Visit (INDEPENDENT_AMBULATORY_CARE_PROVIDER_SITE_OTHER): Payer: BC Managed Care – PPO | Admitting: Physician Assistant

## 2019-10-02 ENCOUNTER — Other Ambulatory Visit (HOSPITAL_COMMUNITY)
Admission: RE | Admit: 2019-10-02 | Discharge: 2019-10-02 | Disposition: A | Discharge status: Home / Self Care | Payer: BC Managed Care – PPO | Source: Ambulatory Visit | Attending: Gastroenterology | Admitting: Gastroenterology

## 2019-10-02 DIAGNOSIS — Z20822 Contact with and (suspected) exposure to covid-19: Secondary | ICD-10-CM | POA: Diagnosis not present

## 2019-10-02 DIAGNOSIS — Z01812 Encounter for preprocedural laboratory examination: Secondary | ICD-10-CM | POA: Diagnosis not present

## 2019-10-02 LAB — SARS CORONAVIRUS 2 (TAT 6-24 HRS): SARS Coronavirus 2: NEGATIVE

## 2019-10-06 ENCOUNTER — Ambulatory Visit (HOSPITAL_COMMUNITY): Payer: BC Managed Care – PPO | Admitting: Certified Registered"

## 2019-10-06 ENCOUNTER — Ambulatory Visit (HOSPITAL_COMMUNITY)
Admission: RE | Admit: 2019-10-06 | Discharge: 2019-10-06 | Disposition: A | Discharge status: Home / Self Care | Payer: BC Managed Care – PPO | Attending: Gastroenterology | Admitting: Gastroenterology

## 2019-10-06 ENCOUNTER — Encounter (HOSPITAL_COMMUNITY): Payer: Self-pay | Admitting: Gastroenterology

## 2019-10-06 ENCOUNTER — Other Ambulatory Visit: Payer: Self-pay

## 2019-10-06 ENCOUNTER — Encounter (HOSPITAL_COMMUNITY)
Admission: RE | Disposition: A | Discharge status: Home / Self Care | Payer: Self-pay | Source: Home / Self Care | Attending: Gastroenterology

## 2019-10-06 DIAGNOSIS — J45909 Unspecified asthma, uncomplicated: Secondary | ICD-10-CM | POA: Diagnosis not present

## 2019-10-06 DIAGNOSIS — D125 Benign neoplasm of sigmoid colon: Secondary | ICD-10-CM | POA: Diagnosis not present

## 2019-10-06 DIAGNOSIS — D649 Anemia, unspecified: Secondary | ICD-10-CM | POA: Diagnosis not present

## 2019-10-06 DIAGNOSIS — I10 Essential (primary) hypertension: Secondary | ICD-10-CM | POA: Diagnosis not present

## 2019-10-06 DIAGNOSIS — Z1211 Encounter for screening for malignant neoplasm of colon: Secondary | ICD-10-CM | POA: Diagnosis present

## 2019-10-06 DIAGNOSIS — F329 Major depressive disorder, single episode, unspecified: Secondary | ICD-10-CM | POA: Insufficient documentation

## 2019-10-06 DIAGNOSIS — M1990 Unspecified osteoarthritis, unspecified site: Secondary | ICD-10-CM | POA: Insufficient documentation

## 2019-10-06 DIAGNOSIS — G4730 Sleep apnea, unspecified: Secondary | ICD-10-CM | POA: Insufficient documentation

## 2019-10-06 DIAGNOSIS — F419 Anxiety disorder, unspecified: Secondary | ICD-10-CM | POA: Insufficient documentation

## 2019-10-06 DIAGNOSIS — R519 Headache, unspecified: Secondary | ICD-10-CM | POA: Diagnosis not present

## 2019-10-06 HISTORY — PX: POLYPECTOMY: SHX5525

## 2019-10-06 HISTORY — PX: COLONOSCOPY WITH PROPOFOL: SHX5780

## 2019-10-06 LAB — GLUCOSE, CAPILLARY: Glucose-Capillary: 76 mg/dL (ref 70–99)

## 2019-10-06 LAB — HM COLONOSCOPY

## 2019-10-06 SURGERY — COLONOSCOPY WITH PROPOFOL
Anesthesia: Monitor Anesthesia Care

## 2019-10-06 MED ORDER — LACTATED RINGERS IV SOLN
INTRAVENOUS | Status: DC
  Administered 2019-10-06: 1000 mL via INTRAVENOUS

## 2019-10-06 MED ORDER — PROPOFOL 500 MG/50ML IV EMUL
INTRAVENOUS | Status: AC
  Filled 2019-10-06: qty 50

## 2019-10-06 MED ORDER — PROPOFOL 10 MG/ML IV BOLUS
INTRAVENOUS | Status: DC | PRN
  Administered 2019-10-06 (×2): 20 mg via INTRAVENOUS
  Administered 2019-10-06: 10 mg via INTRAVENOUS

## 2019-10-06 MED ORDER — SODIUM CHLORIDE 0.9 % IV SOLN: INTRAVENOUS | Status: DC

## 2019-10-06 MED ORDER — PROPOFOL 500 MG/50ML IV EMUL
INTRAVENOUS | Status: DC | PRN
  Administered 2019-10-06: 135 ug/kg/min via INTRAVENOUS

## 2019-10-06 SURGICAL SUPPLY — 22 items

## 2019-10-06 NOTE — Discharge Instructions (Signed)

## 2019-10-06 NOTE — Anesthesia Preprocedure Evaluation (Addendum)
Anesthesia Evaluation  Patient identified by MRN, date of birth, ID band Patient awake    Reviewed: Allergy & Precautions, NPO status , Patient's Chart, lab work & pertinent test results  Airway Mallampati: II  TM Distance: >3 FB Neck ROM: Full    Dental no notable dental hx. (+) Dental Advisory Given   Pulmonary asthma , sleep apnea ,    Pulmonary exam normal breath sounds clear to auscultation       Cardiovascular hypertension, Pt. on medications Normal cardiovascular exam Rhythm:Regular Rate:Normal     Neuro/Psych  Headaches, PSYCHIATRIC DISORDERS Anxiety Depression    GI/Hepatic negative GI ROS, Neg liver ROS,   Endo/Other  negative endocrine ROS  Renal/GU negative Renal ROS     Musculoskeletal  (+) Arthritis ,   Abdominal   Peds  Hematology  (+) Blood dyscrasia, anemia ,   Anesthesia Other Findings   Reproductive/Obstetrics negative OB ROS                             Anesthesia Physical Anesthesia Plan  ASA: III  Anesthesia Plan: MAC   Post-op Pain Management:    Induction: Intravenous  PONV Risk Score and Plan: 2 and Propofol infusion, Treatment may vary due to age or medical condition and TIVA  Airway Management Planned: Natural Airway  Additional Equipment:   Intra-op Plan:   Post-operative Plan:   Informed Consent: I have reviewed the patients History and Physical, chart, labs and discussed the procedure including the risks, benefits and alternatives for the proposed anesthesia with the patient or authorized representative who has indicated his/her understanding and acceptance.     Dental advisory given  Plan Discussed with: CRNA  Anesthesia Plan Comments:        Anesthesia Quick Evaluation

## 2019-10-06 NOTE — Brief Op Note (Signed)
10/06/2019  2:27 PM  PATIENT:  Brooke [name]  49 y.o. female  PRE-OPERATIVE DIAGNOSIS:  Screen for colon cancer  POST-OPERATIVE DIAGNOSIS:  sigmoid polyp  PROCEDURE:  Procedure(s): COLONOSCOPY WITH PROPOFOL (N/A) POLYPECTOMY  SURGEON:  Surgeon(s) and Role:    Ronnette Juniper, MD - Primary  PHYSICIAN ASSISTANT:   ASSISTANTS: Zharia Burton,RN,Chris Chandler,Tech   ANESTHESIA:   MAC  EBL:  Minimal  BLOOD ADMINISTERED:none  DRAINS: none   LOCAL MEDICATIONS USED:  NONE  SPECIMEN:  Biopsy / Limited Resection  DISPOSITION OF SPECIMEN:  PATHOLOGY  COUNTS:  YES  TOURNIQUET:  * No tourniquets in log *  DICTATION: .Dragon Dictation  PLAN OF CARE: Discharge to home after PACU  PATIENT DISPOSITION:  PACU - hemodynamically stable.   Delay start of Pharmacological VTE agent (>24hrs) due to surgical blood loss or risk of bleeding: no

## 2019-10-06 NOTE — H&P (Signed)
History of Present Illness  General:          This was an audiovisual visit using zoom from 4:11 PM to 4:21 PM.        49/African American female was referred for a screening colonoscopy.        Patient states that she has had a colonoscopy 20 years ago for rectal bleeding and was told that she had hemorrhoids. She used to be constipated but has been started on metformin for the past 3 months and takes magnesium at night, which helps to have daily and regular bowel movements.         She is not noted any blood in stool or black stools.         She denies abdominal or rectal pain. She denies nausea or vomiting.         Occasionally she will have episodes of acid reflux and heartburn, worse at night which will wake her up from sleep. Because this is infrequent she is not in any medication for it. She experiences this specially when she eats late.        She denies difficulty swallowing or pain on swallowing.        There is no family history of colon cancer.        She has obstructive sleep apnea.        She is due for her 2nd shot of Covid vaccine on 09/15/18.    Current Medications  TakingValtrex(valACYclovir HCl) 500 MG Tablet 1 tablet Orally PRN, Notes: Not sure of dosage    Bystolic(Nebivolol HCl) 5 MG Tablet TK 1 T PO D Oral    Calcium 1 tab Oral QD    HCTZ 12.5 MG 12.5 MG Tablet 1 tablet in the morning Orally Once a day    Magnesium 300 MG Capsule 1 capsule with a meal Orally Once a day    Mirena 20 MCG/24HR Intrauterine Device Intrauterine    Restasis(cycloSPORINE) 0.05 % Emulsion 1 into affected eye Ophthalmic Twice a day    Vitamin D 125 MCG (5000 UT) Capsule 1 tablet Orally Once a day    Vyvanse(Lisdexamfetamine Dimesylate) 40 MG Capsule 1 capsule in the morning Orally Once a day    Melatonin 10 MG Capsule (20mg )as directed Orally    Sertraline HCl 100 MG Tablet 1 tablet Orally Once a day    Metformin HCl 500 MG Tablet 1 tablet with a meal Orally Once a day     Not-TakingVitamin B Complex - Tablet Orally    Saxenda 18mg /7mL-6mg /mL prefilled pen as directed subcutaneous once a day    Meloxicam 15 MG Tablet 1 tablet Orally Once a day    Medication List reviewed and reconciled with the patient          Past Medical History       Cervical dysplasia 2015.        H/o HTN-Dr. Baird [facility].        Anxiety.        Prediabetes.       Surgical History  right broken ankle 1997      Family History   Father: alive, diagnosed with Hypertension  Mother: alive, Hypertension, Diabetes  Sister 1: alive, Breast cancer  Sister with breast cancer, No Family History of Colon Cancer, Polyps, or Liver Disease.      Social History  General:   Tobacco use       cigarettes:  Never smoked     Tobacco history last  updated  08/29/2019 no Alcohol.  no Recreational drug use.  Exercise: NONE.  Marital Status: single.  Children: none.  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  no Smoking.     Allergies  N.K.D.A.      Hospitalization/Major Diagnostic Procedure  Not within last year 09/2019      Review of Systems  GI PROCEDURE:          no Pacemaker/ AICD, no.  no Artificial heart valves.  no MI/heart attack.  no Abnormal heart rhythm.  no Angina.  no CVA.  Hypertension  YES.  no Hypotension.  no Asthma, COPD.  Sleep apnea  YES, using CPAP.  no Seizure disorders.  no Artificial joints.  no Severe DJD.  no Diabetes.  no Significant headaches.  no Vertigo.  Depression/anxiety  YES.  no Abnormal bleeding.  no Kidney Disease.  no Liver disease, no.  no Chance of pregnancy.  no Blood transfusion.  Method of Birth Control  yes,  IUD.     Vital Signs  Wt 284, Wt change -3.3 lb, Ht 62.5, BMI 51.11 08/29/2019 Wt per pt./[facility].    Examination  Gastroenterology::        GENERAL APPEARANCE: Well developed, obese, pleasant, no acute distress .         SCLERA: anicteric.         RESPIRATORY  Able to speak in full sentences, not in respiratory  distress.         NEURO:  Alert, oriented to time, place and person.         PSYCH: mood/affect normal.            Assessments    1. Screen for colon cancer - Z12.11 (Primary)  2. Morbid (severe) obesity due to excess calories - E66.01  3. Body mass index [BMI] 50.0-59.9, adult - Z68.43  4. Mild acid reflux - K21.9    Treatment  1. Screen for colon cancer        IMAGING: Colonoscopy Notes: Due to a BMI be above 50 this will be done as an outpatient at [facility].   The risks and benefits of the procedure were discussed with the patient in details.  She understands and verbalizes consent.  She will be given written instructions, prescription for preparation and will be scheduled for the same.      2. Morbid (severe) obesity due to excess calories   Notes: Due to a BMI be above 50 this will be done as an outpatient at [facility].      3. Body mass index [BMI] 50.0-59.9, adult   Notes: Due to a BMI be above 50 this will be done as an outpatient at [facility].      4. Mild acid reflux   Notes: This is not associated with dysphagia, melena, early satiety unintentional weight loss, it is intermittent and episodic.  Advised patient to space last meal of the day and bedtime at least 3 hours, avoid limits caffeinated products, weight loss.

## 2019-10-06 NOTE — Interval H&P Note (Signed)
History and Physical Interval Note: 49/African American female with BMI of over 50 for a screening colonoscopy.  10/06/2019 1:57 PM  Pricilla Loveless  has presented today for colonoscopy, with the diagnosis of Screen for colon cancer.  The various methods of treatment have been discussed with the patient and family. After consideration of risks, benefits and other options for treatment, the patient has consented to  Procedure(s): COLONOSCOPY WITH PROPOFOL (N/A) as a surgical intervention.  The patient's history has been reviewed, patient examined, no change in status, stable for surgery.  I have reviewed the patient's chart and labs.  Questions were answered to the patient's satisfaction.     Ronnette Juniper

## 2019-10-06 NOTE — Op Note (Signed)
[facility] Patient Name: Brooke [name] Procedure Date: 10/06/2019 MRN: [id number] Attending MD: Ronnette Juniper , MD Date of Birth: 04-19-1971 CSN: [id number] Age: 49 Admit Type: Outpatient Procedure:                Colonoscopy Indications:              Screening for colorectal malignant neoplasm, This                            is the patient's first colonoscopy Providers:                Ronnette Juniper, MD, Benay Pillow, RN, Elspeth Cho                            Tech., Technician, Maudry Diego, CRNA Referring MD:             Baltazar Apo Medicines:                Propofol per Anesthesia Complications:            No immediate complications. Estimated blood loss:                            Minimal. Estimated Blood Loss:     Estimated blood loss was minimal. Procedure:                Pre-Anesthesia Assessment:                           - Prior to the procedure, a History and Physical                            was performed, and patient medications and                            allergies were reviewed. The patient's tolerance of                            previous anesthesia was also reviewed. The risks                            and benefits of the procedure and the sedation                            options and risks were discussed with the patient.                            All questions were answered, and informed consent                            was obtained. Prior Anticoagulants: The patient has                            taken no previous anticoagulant or antiplatelet  agents. ASA Grade Assessment: II - A patient with                            mild systemic disease. After reviewing the risks                            and benefits, the patient was deemed in                            satisfactory condition to undergo the procedure.                           After obtaining informed consent, the colonoscope             was passed under direct vision. Throughout the                            procedure, the patient's blood pressure, pulse, and                            oxygen saturations were monitored continuously. The                            [id number] Olympus pediatric colonscope                            was introduced through the anus and advanced to the                            the cecum, identified by appendiceal orifice and                            ileocecal valve. The colonoscopy was performed                            without difficulty. The patient tolerated the                            procedure well. The quality of the bowel                            preparation was adequate to identify polyps 6 mm                            and larger in size. Scope In: 2:08:11 PM Scope Out: 2:24:23 PM Scope Withdrawal Time: 0 hours 9 minutes 38 seconds  Total Procedure Duration: 0 hours 16 minutes 12 seconds  Findings:      The perianal and digital rectal examinations were normal.      A 7 mm polyp was found in the sigmoid colon. The polyp was sessile. The       polyp was removed with a hot snare. Resection and retrieval were       complete.      The exam was otherwise without abnormality on direct and retroflexion  views. Impression:               - One 7 mm polyp in the sigmoid colon, removed with                            a hot snare. Resected and retrieved.                           - The examination was otherwise normal on direct                            and retroflexion views. Moderate Sedation:      Patient did not receive moderate sedation for this procedure, but       instead received monitored anesthesia care. Recommendation:           - Patient has a contact number available for                            emergencies. The signs and symptoms of potential                            delayed complications were discussed with the                             patient. Return to normal activities tomorrow.                            Written discharge instructions were provided to the                            patient.                           - Resume regular diet.                           - Continue present medications.                           - Await pathology results.                           - Repeat colonoscopy for surveillance based on                            pathology results. Procedure Code(s):        --- Professional ---                           [phone], Colonoscopy, flexible; with removal of                            tumor(s), polyp(s), or other lesion(s) by snare                            technique Diagnosis Code(s):        ---  Professional ---                           Z12.11, Encounter for screening for malignant                            neoplasm of colon                           K63.5, Polyp of colon CPT copyright 2019 American Medical Association. All rights reserved. The codes documented in this report are preliminary and upon coder review may  be revised to meet current compliance requirements. Ronnette Juniper, MD 10/06/2019 2:31:46 PM This report has been signed electronically. Number of Addenda: 0

## 2019-10-06 NOTE — Anesthesia Postprocedure Evaluation (Signed)
Anesthesia Post Note  Patient: Brooke [name]  Procedure(s) Performed: COLONOSCOPY WITH PROPOFOL (N/A ) POLYPECTOMY     Patient location during evaluation: PACU Anesthesia Type: MAC Level of consciousness: awake and alert Pain management: pain level controlled Vital Signs Assessment: post-procedure vital signs reviewed and stable Respiratory status: spontaneous breathing Cardiovascular status: stable Anesthetic complications: no    Last Vitals:  Vitals:   10/06/19 1440 10/06/19 1450  BP: (!) 142/75 (!) 148/92  Pulse: (!) 57 (!) 56  Resp: 18 13  Temp:    SpO2: 100% 100%    Last Pain:  Vitals:   10/06/19 1432  TempSrc: Oral  PainSc: 0-No pain                 Nolon Nations

## 2019-10-06 NOTE — Transfer of Care (Signed)
Immediate Anesthesia Transfer of Care Note  Patient: Brooke [name]  Procedure(s) Performed: COLONOSCOPY WITH PROPOFOL (N/A ) POLYPECTOMY  Patient Location: PACU  Anesthesia Type:MAC  Level of Consciousness: awake, alert  and oriented  Airway & Oxygen Therapy: Patient Spontanous Breathing and Patient connected to face mask oxygen  Post-op Assessment: Report given to RN, Post -op Vital signs reviewed and stable and Patient moving all extremities X 4  Post vital signs: Reviewed and stable  Last Vitals:  Vitals Value Taken Time  BP    Temp    Pulse 58 10/06/19 1430  Resp    SpO2 100 % 10/06/19 1430  Vitals shown include unvalidated device data.  Last Pain:  Vitals:   10/06/19 1303  TempSrc: Oral  PainSc: 0-No pain         Complications: No apparent anesthesia complications

## 2019-10-06 NOTE — Anesthesia Procedure Notes (Signed)
Procedure Name: MAC Date/Time: 10/06/2019 2:05 PM Performed by: Niel Hummer, CRNA Pre-anesthesia Checklist: Patient identified, Emergency Drugs available, Suction available and Patient being monitored Oxygen Delivery Method: Simple face mask

## 2019-10-07 ENCOUNTER — Encounter: Payer: Self-pay | Admitting: *Deleted

## 2019-10-07 LAB — SURGICAL PATHOLOGY

## 2019-10-15 ENCOUNTER — Other Ambulatory Visit (INDEPENDENT_AMBULATORY_CARE_PROVIDER_SITE_OTHER): Payer: Self-pay | Admitting: Physician Assistant

## 2019-10-15 ENCOUNTER — Ambulatory Visit (INDEPENDENT_AMBULATORY_CARE_PROVIDER_SITE_OTHER): Payer: BC Managed Care – PPO | Admitting: Physician Assistant

## 2019-10-15 DIAGNOSIS — R7303 Prediabetes: Secondary | ICD-10-CM

## 2019-10-20 ENCOUNTER — Encounter: Payer: Self-pay | Admitting: Internal Medicine

## 2019-10-21 ENCOUNTER — Encounter: Payer: Self-pay | Admitting: Internal Medicine

## 2019-10-21 ENCOUNTER — Ambulatory Visit: Payer: BC Managed Care – PPO | Admitting: Internal Medicine

## 2019-10-21 ENCOUNTER — Other Ambulatory Visit: Payer: Self-pay

## 2019-10-21 VITALS — BP 134/78 | HR 74 | Temp 98.2°F | Ht 62.0 in | Wt 282.4 lb

## 2019-10-21 DIAGNOSIS — Z6841 Body Mass Index (BMI) 40.0 and over, adult: Secondary | ICD-10-CM

## 2019-10-21 DIAGNOSIS — R7303 Prediabetes: Secondary | ICD-10-CM | POA: Diagnosis not present

## 2019-10-21 DIAGNOSIS — I10 Essential (primary) hypertension: Secondary | ICD-10-CM

## 2019-10-21 DIAGNOSIS — F331 Major depressive disorder, recurrent, moderate: Secondary | ICD-10-CM

## 2019-10-21 DIAGNOSIS — F988 Other specified behavioral and emotional disorders with onset usually occurring in childhood and adolescence: Secondary | ICD-10-CM | POA: Diagnosis not present

## 2019-10-21 MED ORDER — LISDEXAMFETAMINE DIMESYLATE 40 MG PO CAPS: 40.0000 mg | ORAL_CAPSULE | Freq: Every day | ORAL | 0 refills | Status: DC

## 2019-10-21 MED ORDER — NEBIVOLOL HCL 5 MG PO TABS: ORAL_TABLET | ORAL | 1 refills | Status: DC

## 2019-10-21 MED ORDER — SERTRALINE HCL 100 MG PO TABS: 100.0000 mg | ORAL_TABLET | Freq: Every day | ORAL | 1 refills | Status: DC

## 2019-10-21 MED ORDER — METFORMIN HCL 500 MG PO TABS: 500.0000 mg | ORAL_TABLET | Freq: Every day | ORAL | 1 refills | Status: DC

## 2019-10-21 NOTE — Progress Notes (Signed)
This visit occurred during the SARS-CoV-2 public health emergency.  Safety protocols were in place, including screening questions prior to the visit, additional usage of staff PPE, and extensive cleaning of exam room while observing appropriate contact time as indicated for disinfecting solutions.  Subjective:     Patient ID: Brooke [name] , female    DOB: 1971-01-01 , 49 y.o.   MRN: [id number]   Chief Complaint  Patient presents with  . Hypertension    HPI  Pt presents today for f/u BP, ADD and obesity. She is no longer going to [facility]. She reports that she does not feel it was effective.   Hypertension This is a chronic problem. The current episode started more than 1 year ago. The problem has been gradually improving since onset. The problem is controlled. Pertinent negatives include no blurred vision, chest pain, palpitations or shortness of breath.     Past Medical History:  Diagnosis Date  . ADD (attention deficit disorder)   . Allergy    allergic rhinitis  . Anemia    nos  . Anxiety   . Arthritis    right ankle  . Asthma    as a child  . Back pain   . Constipation   . Depression   . Elevated blood pressure reading without diagnosis of hypertension   . Genital herpes   . Lactose intolerance   . Migraine   . Prediabetes   . Sleep apnea   . Stress   . Swelling      Family History  Problem Relation Age of Onset  . Hypertension Mother   . Diabetes Mother   . Hyperlipidemia Mother   . Hypertension Father   . Sleep apnea Father   . Alcoholism Father   . Obesity Father   . Hypertension Other   . Diabetes Maternal Aunt   . Diabetes Maternal Uncle   . Heart failure Paternal Aunt   . Diabetes Paternal Aunt   . Heart failure Maternal Grandmother   . Heart failure Maternal Grandfather   . Breast cancer Sister      Current Outpatient Medications:  .  calcium carbonate (OSCAL) 1500 (600 Ca) MG TABS tablet, Take 1 tablet by mouth daily.,  Disp: , Rfl:  .  hydrochlorothiazide (MICROZIDE) 12.5 MG capsule, Take 1 capsule (12.5 mg total) by mouth daily., Disp: 90 capsule, Rfl: 1 .  lisdexamfetamine (VYVANSE) 40 MG capsule, Take 1 capsule (40 mg total) by mouth daily., Disp: 30 capsule, Rfl: 0 .  Magnesium Glycinate POWD, 400 mg by Does not apply route daily. (Patient taking differently: Take 400 mg by mouth at bedtime. ), Disp: 100 g, Rfl: 0 .  Melatonin 10 MG TABS, Take 20 mg by mouth at bedtime. , Disp: , Rfl:  .  Multiple Vitamin (MULTIVITAMIN) tablet, Take 1 tablet by mouth daily., Disp: , Rfl:  .  nebivolol (BYSTOLIC) 5 MG tablet, TAKE 1 TABLET BY MOUTH DAILY, Disp: 90 tablet, Rfl: 1 .  sertraline (ZOLOFT) 100 MG tablet, Take 1 tablet (100 mg total) by mouth daily., Disp: 90 tablet, Rfl: 1 .  valACYclovir (VALTREX) 500 MG tablet, Take 500 mg by mouth as needed (genital herpes). , Disp: , Rfl:  .  Vitamin D, Cholecalciferol, 25 MCG (1000 UT) TABS, Take 5,000 Units by mouth daily. , Disp: , Rfl:  .  metFORMIN (GLUCOPHAGE) 500 MG tablet, Take 1 tablet (500 mg total) by mouth daily with breakfast., Disp: 90 tablet, Rfl: 1  No Known Allergies   Review of Systems  Constitutional: Negative.   Eyes: Negative for blurred vision.  Respiratory: Negative.  Negative for shortness of breath.   Cardiovascular: Negative for chest pain and palpitations.  Gastrointestinal: Negative.   Neurological: Negative.   Psychiatric/Behavioral: Negative.      Today's Vitals   10/21/19 1525  BP: 134/78  Pulse: 74  Temp: 98.2 F (36.8 C)  TempSrc: Oral  Weight: 282 lb 6.4 oz (128.1 kg)  Height: 5' 2"  (1.575 m)   Body mass index is 51.65 kg/m.   Wt Readings from Last 3 Encounters:  10/21/19 282 lb 6.4 oz (128.1 kg)  10/06/19 283 lb (128.4 kg)  08/28/19 284 lb 3.2 oz (128.9 kg)     Objective:  Physical Exam Vitals and nursing note reviewed.  Constitutional:      Appearance: Normal appearance. She is obese.  HENT:     Head:  Normocephalic and atraumatic.  Cardiovascular:     Rate and Rhythm: Normal rate and regular rhythm.     Heart sounds: Normal heart sounds.  Pulmonary:     Effort: Pulmonary effort is normal.     Breath sounds: Normal breath sounds.  Skin:    General: Skin is warm.  Neurological:     General: No focal deficit present.     Mental Status: She is alert.  Psychiatric:        Mood and Affect: Mood normal.        Behavior: Behavior normal.         Assessment And Plan:     1. Essential hypertension  Chronic, controlled. She will continue with current meds. She is encouraged to avoid adding salt to her foods. She was also given refill of Bystolic.   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 She was given refill of Vyvanse. Review of the [place] CSRS was performed in accordance of the [place] prior to dispensing any controlled drugs.  - lisdexamfetamine (VYVANSE) 40 MG capsule; Take 1 capsule (40 mg total) by mouth daily.  Dispense: 30 capsule; Refill: 0  3. Prediabetes  HER A1C HAS BEEN ELEVATED IN THE PAST. I WILL CHECK AN A1C, BMET TODAY. SHE WAS ENCOURAGED TO AVOID SUGARY BEVERAGES AND PROCESSED FOODS INCLUDNG BREADS, RICE AND PASTA.  - metFORMIN (GLUCOPHAGE) 500 MG tablet; Take 1 tablet (500 mg total) by mouth daily with breakfast.  Dispense: 90 tablet; Refill: 1 - Ambulatory referral to Internal Medicine - Hemoglobin A1c  4. Moderate episode of recurrent major depressive disorder (HCC)  Chronic, yet stable.  She will continue with current meds.   - sertraline (ZOLOFT) 100 MG tablet; Take 1 tablet (100 mg total) by mouth daily.  Dispense: 90 tablet; Refill: 1  5. Class 3 severe obesity due to excess calories with serious comorbidity and body mass index (BMI) of 50.0 to 59.9 in adult [facility])  Chronic. She  agrees to referral to [facility] program. She is advised that they have a [facility] location. Pt advised that she may have to attend a virtual orientation class. She is in agreement with her treatment plan.   - Ambulatory referral to Internal Medicine   Maximino Greenland, MD    THE PATIENT IS ENCOURAGED TO PRACTICE SOCIAL DISTANCING DUE TO THE COVID-19 PANDEMIC.

## 2019-10-22 LAB — BMP8+EGFR
BUN/Creatinine Ratio: 14 (ref 9–23)
BUN: 11 mg/dL (ref 6–24)
CO2: 27 mmol/L (ref 20–29)
Calcium: 9.4 mg/dL (ref 8.7–10.2)
Chloride: 100 mmol/L (ref 96–106)
Creatinine, Ser: 0.8 mg/dL (ref 0.57–1.00)
GFR calc Af Amer: 100 mL/min/{1.73_m2} (ref >59)
GFR calc non Af Amer: 87 mL/min/{1.73_m2} (ref >59)
Glucose: 99 mg/dL (ref 65–99)
Potassium: 3.8 mmol/L (ref 3.5–5.2)
Sodium: 139 mmol/L (ref 134–144)

## 2019-10-22 LAB — HEMOGLOBIN A1C
Est. average glucose Bld gHb Est-mCnc: 120 mg/dL
Hgb A1c MFr Bld: 5.8 % — ABNORMAL HIGH (ref 4.8–5.6)

## 2019-12-03 ENCOUNTER — Encounter: Payer: Self-pay | Admitting: Internal Medicine

## 2019-12-03 ENCOUNTER — Other Ambulatory Visit: Payer: Self-pay | Admitting: Internal Medicine

## 2019-12-03 DIAGNOSIS — F988 Other specified behavioral and emotional disorders with onset usually occurring in childhood and adolescence: Secondary | ICD-10-CM

## 2019-12-03 MED ORDER — LISDEXAMFETAMINE DIMESYLATE 40 MG PO CAPS: 40.0000 mg | ORAL_CAPSULE | Freq: Every day | ORAL | 0 refills | Status: DC

## 2020-01-14 ENCOUNTER — Encounter: Payer: Self-pay | Admitting: Internal Medicine

## 2020-01-15 ENCOUNTER — Ambulatory Visit: Payer: BC Managed Care – PPO | Admitting: Nurse Practitioner

## 2020-01-15 ENCOUNTER — Other Ambulatory Visit: Payer: Self-pay

## 2020-01-15 VITALS — BP 120/76 | HR 68 | Temp 98.1°F | Ht 62.0 in | Wt 284.0 lb

## 2020-01-15 DIAGNOSIS — F988 Other specified behavioral and emotional disorders with onset usually occurring in childhood and adolescence: Secondary | ICD-10-CM

## 2020-01-15 MED ORDER — LISDEXAMFETAMINE DIMESYLATE 40 MG PO CAPS: 40.0000 mg | ORAL_CAPSULE | Freq: Every day | ORAL | 0 refills | Status: DC

## 2020-01-15 NOTE — Progress Notes (Signed)
This visit occurred during the SARS-CoV-2 public health emergency.  Safety protocols were in place, including screening questions prior to the visit, additional usage of staff PPE, and extensive cleaning of exam room while observing appropriate contact time as indicated for disinfecting solutions.  Subjective:     Patient ID: Brooke [name] , female    DOB: 1970/10/31 , 49 y.o.   MRN: [id number]   Chief Complaint  Patient presents with  . MED CHECK    patient presents today for a med check on vyvanse     HPI  Here for refill on Vyvanse.  Overall she is doing well with the current dose.     Past Medical History:  Diagnosis Date  . ADD (attention deficit disorder)   . Allergy    allergic rhinitis  . Anemia    nos  . Anxiety   . Arthritis    right ankle  . Asthma    as a child  . Back pain   . Constipation   . Depression   . Elevated blood pressure reading without diagnosis of hypertension   . Genital herpes   . Lactose intolerance   . Migraine   . Prediabetes   . Sleep apnea   . Stress   . Swelling      Family History  Problem Relation Age of Onset  . Hypertension Mother   . Diabetes Mother   . Hyperlipidemia Mother   . Hypertension Father   . Sleep apnea Father   . Alcoholism Father   . Obesity Father   . Hypertension Other   . Diabetes Maternal Aunt   . Diabetes Maternal Uncle   . Heart failure Paternal Aunt   . Diabetes Paternal Aunt   . Heart failure Maternal Grandmother   . Heart failure Maternal Grandfather   . Breast cancer Sister      Current Outpatient Medications:  .  calcium carbonate (OSCAL) 1500 (600 Ca) MG TABS tablet, Take 1 tablet by mouth daily., Disp: , Rfl:  .  hydrochlorothiazide (MICROZIDE) 12.5 MG capsule, Take 1 capsule (12.5 mg total) by mouth daily., Disp: 90 capsule, Rfl: 1 .  lisdexamfetamine (VYVANSE) 40 MG capsule, Take 1 capsule (40 mg total) by mouth daily., Disp: 30 capsule, Rfl: 0 .  Magnesium Glycinate POWD, 400 mg  by Does not apply route daily. (Patient taking differently: Take 400 mg by mouth at bedtime. ), Disp: 100 g, Rfl: 0 .  Melatonin 10 MG TABS, Take 20 mg by mouth at bedtime. , Disp: , Rfl:  .  metFORMIN (GLUCOPHAGE) 500 MG tablet, Take 1 tablet (500 mg total) by mouth daily with breakfast., Disp: 90 tablet, Rfl: 1 .  Multiple Vitamin (MULTIVITAMIN) tablet, Take 1 tablet by mouth daily., Disp: , Rfl:  .  nebivolol (BYSTOLIC) 5 MG tablet, TAKE 1 TABLET BY MOUTH DAILY, Disp: 90 tablet, Rfl: 1 .  sertraline (ZOLOFT) 100 MG tablet, Take 1 tablet (100 mg total) by mouth daily., Disp: 90 tablet, Rfl: 1 .  valACYclovir (VALTREX) 500 MG tablet, Take 500 mg by mouth as needed (genital herpes). , Disp: , Rfl:  .  Vitamin D, Cholecalciferol, 25 MCG (1000 UT) TABS, Take 5,000 Units by mouth daily. , Disp: , Rfl:    No Known Allergies   Review of Systems  Constitutional: Negative.  Negative for fatigue.  Respiratory: Negative.   Cardiovascular: Negative.  Negative for chest pain, palpitations and leg swelling.  Neurological: Negative for dizziness and headaches.  Psychiatric/Behavioral:  Negative.      Today's Vitals   01/15/20 1023  BP: 120/76  Pulse: 68  Temp: 98.1 F (36.7 C)  TempSrc: Oral  SpO2: 98%  Weight: 284 lb (128.8 kg)  Height: 5\' 2"  (1.575 m)  PainSc: 0-No pain   Body mass index is 51.94 kg/m.   Objective:  Physical Exam Vitals reviewed.  Constitutional:      General: She is not in acute distress.    Appearance: Normal appearance. She is obese.  Cardiovascular:     Rate and Rhythm: Normal rate and regular rhythm.     Pulses: Normal pulses.     Heart sounds: Normal heart sounds. No murmur heard.   Pulmonary:     Effort: Pulmonary effort is normal. No respiratory distress.     Breath sounds: Normal breath sounds.  Skin:    Capillary Refill: Capillary refill takes less than 2 seconds.  Neurological:     General: No focal deficit present.     Mental Status: She is alert  and oriented to person, place, and time.  Psychiatric:        Mood and Affect: Mood normal.        Behavior: Behavior normal.        Thought Content: Thought content normal.        Judgment: Judgment normal.         Assessment And Plan:     1. ADD (attention deficit disorder) without hyperactivity  Chronic, doing well   Will refill her Vyvanse.   - lisdexamfetamine (VYVANSE) 40 MG capsule; Take 1 capsule (40 mg total) by mouth daily.  Dispense: 30 capsule; Refill: 0   Patient was given opportunity to ask questions. Patient verbalized understanding of the plan and was able to repeat key elements of the plan. All questions were answered to their satisfaction.  Minette Brine, FNP   I, Minette Brine, FNP, have reviewed all documentation for this visit. The documentation on 01/15/20 for the exam, diagnosis, procedures, and orders are all accurate and complete.  THE PATIENT IS ENCOURAGED TO PRACTICE SOCIAL DISTANCING DUE TO THE COVID-19 PANDEMIC.

## 2020-01-16 ENCOUNTER — Encounter: Payer: Self-pay | Admitting: Nurse Practitioner

## 2020-01-20 ENCOUNTER — Ambulatory Visit: Payer: BC Managed Care – PPO | Admitting: Internal Medicine

## 2020-02-17 ENCOUNTER — Encounter: Payer: Self-pay | Admitting: Internal Medicine

## 2020-02-17 ENCOUNTER — Other Ambulatory Visit: Payer: Self-pay | Admitting: Internal Medicine

## 2020-02-17 DIAGNOSIS — F988 Other specified behavioral and emotional disorders with onset usually occurring in childhood and adolescence: Secondary | ICD-10-CM

## 2020-02-17 MED ORDER — LISDEXAMFETAMINE DIMESYLATE 40 MG PO CAPS: 40.0000 mg | ORAL_CAPSULE | Freq: Every day | ORAL | 0 refills | Status: DC

## 2020-02-23 ENCOUNTER — Encounter: Payer: Self-pay | Admitting: Internal Medicine

## 2020-02-24 ENCOUNTER — Other Ambulatory Visit: Payer: Self-pay

## 2020-02-24 ENCOUNTER — Ambulatory Visit: Payer: BC Managed Care – PPO

## 2020-02-24 ENCOUNTER — Encounter: Payer: BC Managed Care – PPO | Admitting: Internal Medicine

## 2020-02-24 VITALS — BP 130/72 | HR 85 | Temp 98.5°F | Ht 62.0 in | Wt 265.0 lb

## 2020-02-24 VITALS — BP 130/72 | HR 85 | Ht 62.0 in | Wt 265.0 lb

## 2020-02-24 DIAGNOSIS — Z20822 Contact with and (suspected) exposure to covid-19: Secondary | ICD-10-CM

## 2020-02-24 NOTE — Progress Notes (Signed)
Patient was seen today for covid testing. 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 She should take xyzal for her symptoms and monitor her tempature until her results come back

## 2020-02-24 NOTE — Progress Notes (Signed)
This should have been nurse visit - mistakenly put on my schedule. Erroneous.

## 2020-02-24 NOTE — Patient Instructions (Signed)

## 2020-02-25 LAB — NOVEL CORONAVIRUS, NAA: SARS-CoV-2, NAA: NOT DETECTED

## 2020-02-26 ENCOUNTER — Encounter: Payer: Self-pay | Admitting: Internal Medicine

## 2020-03-15 ENCOUNTER — Encounter: Payer: Self-pay | Admitting: Internal Medicine

## 2020-03-16 ENCOUNTER — Encounter: Payer: Self-pay | Admitting: Nurse Practitioner

## 2020-03-16 ENCOUNTER — Other Ambulatory Visit: Payer: Self-pay

## 2020-03-16 ENCOUNTER — Encounter: Payer: Self-pay | Admitting: Internal Medicine

## 2020-03-16 ENCOUNTER — Ambulatory Visit: Payer: BC Managed Care – PPO | Admitting: Nurse Practitioner

## 2020-03-16 VITALS — BP 124/80 | HR 80 | Temp 98.9°F | Ht 62.0 in | Wt 266.6 lb

## 2020-03-16 DIAGNOSIS — B09 Unspecified viral infection characterized by skin and mucous membrane lesions: Secondary | ICD-10-CM | POA: Diagnosis not present

## 2020-03-16 DIAGNOSIS — J40 Bronchitis, not specified as acute or chronic: Secondary | ICD-10-CM | POA: Diagnosis not present

## 2020-03-16 MED ORDER — CEPHALEXIN 500 MG PO CAPS: 500.0000 mg | ORAL_CAPSULE | Freq: Four times a day (QID) | ORAL | 0 refills | Status: DC

## 2020-03-16 MED ORDER — AZITHROMYCIN 250 MG PO TABS: ORAL_TABLET | ORAL | 0 refills | Status: AC

## 2020-03-16 MED ORDER — CEFTRIAXONE SODIUM 1 G IJ SOLR
1.0000 g | Freq: Once | INTRAMUSCULAR | Status: AC
  Administered 2020-03-16: 1 g via INTRAMUSCULAR

## 2020-03-16 NOTE — Progress Notes (Signed)
I,Yamilka Roman [employer] as a Education administrator for [employer], FNP.,have documented all relevant documentation on the behalf of Minette Brine, FNP,as directed by  Minette Brine, FNP while in the presence of Minette Brine, [place]. This visit occurred during the SARS-CoV-2 public health emergency.  Safety protocols were in place, including screening questions prior to the visit, additional usage of staff PPE, and extensive cleaning of exam room while observing appropriate contact time as indicated for disinfecting solutions.  Subjective:     Patient ID: Brooke [name] , female    DOB: 04/09/1971 , 49 y.o.   MRN: [id number]   Chief Complaint  Patient presents with  . Rash    patient stated she was having covid symptoms but both her test came back negative. patient then stated she broke out in a rash    HPI  She went twice to be tested for covid due to having   She had an area on her left buttocks, she has some in her mouth and on her arms.  She has a history of herpes which she was having an outbreak vaginal. Had swelling to her left neck gland and right behind her ear with pain.  She does work in the school system.  She started taking her valtrex on Sunday, the area on her vaginal area are gone and has on her lips, gums.  Sometimes will itch and some don't. She had a fever for a few days low grade.  She states she usually runs a temp of 94-96, was up to 99.  She had the chills during al of these symptoms.  Today is the first day she has not had a headache. Took tylenol.   Rash This is a new problem. Pertinent negatives include no cough.     Past Medical History:  Diagnosis Date  . ADD (attention deficit disorder)   . Allergy    allergic rhinitis  . Anemia    nos  . Anxiety   . Arthritis    right ankle  . Asthma    as a child  . Back pain   . Constipation   . Depression   . Elevated blood pressure reading without diagnosis of hypertension   . Genital herpes   . Lactose intolerance    . Migraine   . Prediabetes   . Sleep apnea   . Stress   . Swelling      Family History  Problem Relation Age of Onset  . Hypertension Mother   . Diabetes Mother   . Hyperlipidemia Mother   . Hypertension Father   . Sleep apnea Father   . Alcoholism Father   . Obesity Father   . Hypertension Other   . Diabetes Maternal Aunt   . Diabetes Maternal Uncle   . Heart failure Paternal Aunt   . Diabetes Paternal Aunt   . Heart failure Maternal Grandmother   . Heart failure Maternal Grandfather   . Breast cancer Sister      Current Outpatient Medications:  .  calcium carbonate (OSCAL) 1500 (600 Ca) MG TABS tablet, Take 1 tablet by mouth daily., Disp: , Rfl:  .  cycloSPORINE (RESTASIS) 0.05 % ophthalmic emulsion, Place 1 drop into both eyes 2 (two) times daily., Disp: , Rfl:  .  hydrochlorothiazide (MICROZIDE) 12.5 MG capsule, Take 1 capsule (12.5 mg total) by mouth daily., Disp: 90 capsule, Rfl: 1 .  Magnesium Glycinate POWD, 400 mg by Does not apply route daily. (Patient taking differently: Take 400 mg  by mouth at bedtime. ), Disp: 100 g, Rfl: 0 .  Melatonin 10 MG TABS, Take 20 mg by mouth at bedtime. , Disp: , Rfl:  .  Multiple Vitamin (MULTIVITAMIN) tablet, Take 1 tablet by mouth daily., Disp: , Rfl:  .  nebivolol (BYSTOLIC) 5 MG tablet, TAKE 1 TABLET BY MOUTH DAILY, Disp: 90 tablet, Rfl: 1 .  sertraline (ZOLOFT) 100 MG tablet, Take 1 tablet (100 mg total) by mouth daily., Disp: 90 tablet, Rfl: 1 .  valACYclovir (VALTREX) 500 MG tablet, Take 500 mg by mouth as needed (genital herpes). , Disp: , Rfl:  .  Vitamin D, Cholecalciferol, 25 MCG (1000 UT) TABS, Take 5,000 Units by mouth daily. , Disp: , Rfl:  .  lisdexamfetamine (VYVANSE) 40 MG capsule, Take 1 capsule (40 mg total) by mouth daily., Disp: 30 capsule, Rfl: 0 .  metFORMIN (GLUCOPHAGE) 500 MG tablet, Take 1 tablet (500 mg total) by mouth daily with breakfast. (Patient taking differently: Take 500 mg by mouth in the morning and  at bedtime. ), Disp: 90 tablet, Rfl: 1 .  Semaglutide-Weight Management (WEGOVY) 1 MG/0.5ML SOAJ, Inject 1 mg into the skin once a week., Disp: 2 mL, Rfl: 0   No Known Allergies   Review of Systems  Constitutional: Negative.   Respiratory: Negative.  Negative for cough.   Cardiovascular: Negative.  Negative for chest pain, palpitations and leg swelling.  Skin: Positive for rash.  Psychiatric/Behavioral: Negative.      Today's Vitals   03/16/20 1458  BP: 124/80  Pulse: 80  Temp: 98.9 F (37.2 C)  TempSrc: Oral  Weight: 266 lb 9.6 oz (120.9 kg)  Height: 5\' 2"  (1.575 m)  PainSc: 0-No pain   Body mass index is 48.76 kg/m.   Objective:  Physical Exam Vitals reviewed.  Constitutional:      General: She is not in acute distress.    Appearance: Normal appearance.  Cardiovascular:     Rate and Rhythm: Normal rate and regular rhythm.     Pulses: Normal pulses.     Heart sounds: Normal heart sounds. No murmur heard.   Pulmonary:     Effort: Pulmonary effort is normal. No respiratory distress.     Breath sounds: Normal breath sounds.  Skin:    Capillary Refill: Capillary refill takes less than 2 seconds.     Findings: Rash (prickly erythematous rash to her arms bilaterally) present.  Neurological:     General: No focal deficit present.     Mental Status: She is alert and oriented to person, place, and time.  Psychiatric:        Mood and Affect: Mood normal.        Behavior: Behavior normal.        Thought Content: Thought content normal.        Judgment: Judgment normal.         Assessment And Plan:     1. Viral rash  Prickly rash to arms which is likely related to having a virus preceding the rash  Will treat with antibiotic as well for possible upper respiratory infection - cefTRIAXone (ROCEPHIN) injection 1 g - azithromycin (ZITHROMAX) 250 MG tablet; Take 2 tablets (500 mg) on  Day 1,  followed by 1 tablet (250 mg) once daily on Days 2 through 5.  Dispense: 6  each; Refill: 0  2. Bronchitis  Persistent cough for 2 weeks, will treat with rocephin and azithromycin - cefTRIAXone (ROCEPHIN) injection 1 g - azithromycin (ZITHROMAX) 250  MG tablet; Take 2 tablets (500 mg) on  Day 1,  followed by 1 tablet (250 mg) once daily on Days 2 through 5.  Dispense: 6 each; Refill: 0     Patient was given opportunity to ask questions. Patient verbalized understanding of the plan and was able to repeat key elements of the plan. All questions were answered to their satisfaction.   Teola Bradley, FNP, have reviewed all documentation for this visit. The documentation on 03/30/20 for the exam, diagnosis, procedures, and orders are all accurate and complete.  THE PATIENT IS ENCOURAGED TO PRACTICE SOCIAL DISTANCING DUE TO THE COVID-19 PANDEMIC.

## 2020-03-22 ENCOUNTER — Other Ambulatory Visit: Payer: Self-pay | Admitting: Internal Medicine

## 2020-03-22 ENCOUNTER — Encounter: Payer: Self-pay | Admitting: Internal Medicine

## 2020-03-22 ENCOUNTER — Encounter: Payer: BC Managed Care – PPO | Admitting: Internal Medicine

## 2020-03-22 DIAGNOSIS — F988 Other specified behavioral and emotional disorders with onset usually occurring in childhood and adolescence: Secondary | ICD-10-CM

## 2020-03-22 MED ORDER — LISDEXAMFETAMINE DIMESYLATE 40 MG PO CAPS: 40.0000 mg | ORAL_CAPSULE | Freq: Every day | ORAL | 0 refills | Status: DC

## 2020-03-26 ENCOUNTER — Ambulatory Visit (INDEPENDENT_AMBULATORY_CARE_PROVIDER_SITE_OTHER): Payer: BC Managed Care – PPO | Admitting: Internal Medicine

## 2020-03-26 ENCOUNTER — Encounter: Payer: Self-pay | Admitting: Internal Medicine

## 2020-03-26 ENCOUNTER — Other Ambulatory Visit: Payer: Self-pay

## 2020-03-26 VITALS — BP 144/92 | HR 75 | Temp 98.8°F | Ht 61.8 in | Wt 264.6 lb

## 2020-03-26 DIAGNOSIS — Z0000 Encounter for general adult medical examination without abnormal findings: Secondary | ICD-10-CM

## 2020-03-26 DIAGNOSIS — I10 Essential (primary) hypertension: Secondary | ICD-10-CM | POA: Diagnosis not present

## 2020-03-26 DIAGNOSIS — Z6841 Body Mass Index (BMI) 40.0 and over, adult: Secondary | ICD-10-CM

## 2020-03-26 DIAGNOSIS — Z23 Encounter for immunization: Secondary | ICD-10-CM | POA: Diagnosis not present

## 2020-03-26 LAB — POCT URINALYSIS DIPSTICK
Appearance: NEGATIVE
Bilirubin, UA: NEGATIVE
Blood, UA: NEGATIVE
Glucose, UA: NEGATIVE
Ketones, UA: NEGATIVE
Leukocytes, UA: NEGATIVE
Nitrite, UA: NEGATIVE
Protein, UA: NEGATIVE
Spec Grav, UA: 1.025 (ref 1.010–1.025)
Urobilinogen, UA: 0.2 E.U./dL
pH, UA: 8.5 — AB (ref 5.0–8.0)

## 2020-03-26 LAB — POCT UA - MICROALBUMIN
Albumin/Creatinine Ratio, Urine, POC: <30
Creatinine, POC: 100 mg/dL
Microalbumin Ur, POC: 30 mg/L

## 2020-03-26 NOTE — Patient Instructions (Signed)
Health Maintenance, Female Adopting a healthy lifestyle and getting preventive care are important in promoting health and wellness. Ask your health care provider about:  The right schedule for you to have regular tests and exams.  Things you can do on your own to prevent diseases and keep yourself healthy. What should I know about diet, weight, and exercise? Eat a healthy diet   Eat a diet that includes plenty of vegetables, fruits, low-fat dairy products, and lean protein.  Do not eat a lot of foods that are high in solid fats, added sugars, or sodium. Maintain a healthy weight Body mass index (BMI) is used to identify weight problems. It estimates body fat based on height and weight. Your health care provider can help determine your BMI and help you achieve or maintain a healthy weight. Get regular exercise Get regular exercise. This is one of the most important things you can do for your health. Most adults should:  Exercise for at least 150 minutes each week. The exercise should increase your heart rate and make you sweat (moderate-intensity exercise).  Do strengthening exercises at least twice a week. This is in addition to the moderate-intensity exercise.  Spend less time sitting. Even light physical activity can be beneficial. Watch cholesterol and blood lipids Have your blood tested for lipids and cholesterol at 49 years of age, then have this test every 5 years. Have your cholesterol levels checked more often if:  Your lipid or cholesterol levels are high.  You are older than 49 years of age.  You are at high risk for heart disease. What should I know about cancer screening? Depending on your health history and family history, you may need to have cancer screening at various ages. This may include screening for:  Breast cancer.  Cervical cancer.  Colorectal cancer.  Skin cancer.  Lung cancer. What should I know about heart disease, diabetes, and high blood  pressure? Blood pressure and heart disease  High blood pressure causes heart disease and increases the risk of stroke. This is more likely to develop in people who have high blood pressure readings, are of African descent, or are overweight.  Have your blood pressure checked: ? Every 3-5 years if you are 18-39 years of age. ? Every year if you are 40 years old or older. Diabetes Have regular diabetes screenings. This checks your fasting blood sugar level. Have the screening done:  Once every three years after age 40 if you are at a normal weight and have a low risk for diabetes.  More often and at a younger age if you are overweight or have a high risk for diabetes. What should I know about preventing infection? Hepatitis B If you have a higher risk for hepatitis B, you should be screened for this virus. Talk with your health care provider to find out if you are at risk for hepatitis B infection. Hepatitis C Testing is recommended for:  Everyone born from 1945 through 1965.  Anyone with known risk factors for hepatitis C. Sexually transmitted infections (STIs)  Get screened for STIs, including gonorrhea and chlamydia, if: ? You are sexually active and are younger than 49 years of age. ? You are older than 49 years of age and your health care provider tells you that you are at risk for this type of infection. ? Your sexual activity has changed since you were last screened, and you are at increased risk for chlamydia or gonorrhea. Ask your health care provider if   you are at risk.  Ask your health care provider about whether you are at high risk for HIV. Your health care provider may recommend a prescription medicine to help prevent HIV infection. If you choose to take medicine to prevent HIV, you should first get tested for HIV. You should then be tested every 3 months for as long as you are taking the medicine. Pregnancy  If you are about to stop having your period (premenopausal) and  you may become pregnant, seek counseling before you get pregnant.  Take 400 to 800 micrograms (mcg) of folic acid every day if you become pregnant.  Ask for birth control (contraception) if you want to prevent pregnancy. Osteoporosis and menopause Osteoporosis is a disease in which the bones lose minerals and strength with aging. This can result in bone fractures. If you are 65 years old or older, or if you are at risk for osteoporosis and fractures, ask your health care provider if you should:  Be screened for bone loss.  Take a calcium or vitamin D supplement to lower your risk of fractures.  Be given hormone replacement therapy (HRT) to treat symptoms of menopause. Follow these instructions at home: Lifestyle  Do not use any products that contain nicotine or tobacco, such as cigarettes, e-cigarettes, and chewing tobacco. If you need help quitting, ask your health care provider.  Do not use street drugs.  Do not share needles.  Ask your health care provider for help if you need support or information about quitting drugs. Alcohol use  Do not drink alcohol if: ? Your health care provider tells you not to drink. ? You are pregnant, may be pregnant, or are planning to become pregnant.  If you drink alcohol: ? Limit how much you use to 0-1 drink a day. ? Limit intake if you are breastfeeding.  Be aware of how much alcohol is in your drink. In the [place] one drink equals one 12 oz bottle of beer (355 mL), one 5 oz glass of wine (148 mL), or one 1 oz glass of hard liquor (44 mL). General instructions  Schedule regular health, dental, and eye exams.  Stay current with your vaccines.  Tell your health care provider if: ? You often feel depressed. ? You have ever been abused or do not feel safe at home. Summary  Adopting a healthy lifestyle and getting preventive care are important in promoting health and wellness.  Follow your health care provider's instructions about healthy  diet, exercising, and getting tested or screened for diseases.  Follow your health care provider's instructions on monitoring your cholesterol and blood pressure. This information is not intended to replace advice given to you by your health care provider. Make sure you discuss any questions you have with your health care provider. Document Revised: 06/05/2018 Document Reviewed: 06/05/2018 Elsevier Patient Education  2020 Elsevier Inc.  

## 2020-03-26 NOTE — Progress Notes (Signed)
I,[name],acting as a Education administrator for Maximino Greenland, MD.,have documented all relevant documentation on the behalf of Maximino Greenland, MD,as directed by  Maximino Greenland, MD while in the presence of Maximino Greenland, MD.  This visit occurred during the SARS-CoV-2 public health emergency.  Safety protocols were in place, including screening questions prior to the visit, additional usage of staff PPE, and extensive cleaning of exam room while observing appropriate contact time as indicated for disinfecting solutions.  Subjective:     Patient ID: Brooke [name] , female    DOB: 20-Feb-1971 , 49 y.o.   MRN: [id number]   Chief Complaint  Patient presents with  . Annual Exam  . Hypertension    HPI  The patient is here today for a physical examination.   She goes to [facility] for her paps, she was seeing Dr. Margie Billet who has recently retired. She has yet to establish with new provider.  Hypertension This is a chronic problem. The current episode started more than 1 year ago. The problem has been gradually improving since onset. The problem is controlled. Pertinent negatives include no blurred vision, chest pain, palpitations or shortness of breath.     Past Medical History:  Diagnosis Date  . ADD (attention deficit disorder)   . Allergy    allergic rhinitis  . Anemia    nos  . Anxiety   . Arthritis    right ankle  . Asthma    as a child  . Back pain   . Constipation   . Depression   . Elevated blood pressure reading without diagnosis of hypertension   . Genital herpes   . Lactose intolerance   . Migraine   . Prediabetes   . Sleep apnea   . Stress   . Swelling      Family History  Problem Relation Age of Onset  . Hypertension Mother   . Diabetes Mother   . Hyperlipidemia Mother   . Hypertension Father   . Sleep apnea Father   . Alcoholism Father   . Obesity Father   . Hypertension Other   . Diabetes Maternal Aunt   . Diabetes Maternal Uncle   . Heart  failure Paternal Aunt   . Diabetes Paternal Aunt   . Heart failure Maternal Grandmother   . Heart failure Maternal Grandfather   . Breast cancer Sister      Current Outpatient Medications:  .  calcium carbonate (OSCAL) 1500 (600 Ca) MG TABS tablet, Take 1 tablet by mouth daily., Disp: , Rfl:  .  cycloSPORINE (RESTASIS) 0.05 % ophthalmic emulsion, Place 1 drop into both eyes 2 (two) times daily., Disp: , Rfl:  .  hydrochlorothiazide (MICROZIDE) 12.5 MG capsule, Take 1 capsule (12.5 mg total) by mouth daily., Disp: 90 capsule, Rfl: 1 .  lisdexamfetamine (VYVANSE) 40 MG capsule, Take 1 capsule (40 mg total) by mouth daily., Disp: 30 capsule, Rfl: 0 .  Magnesium Glycinate POWD, 400 mg by Does not apply route daily. (Patient taking differently: Take 400 mg by mouth at bedtime. ), Disp: 100 g, Rfl: 0 .  Melatonin 10 MG TABS, Take 20 mg by mouth at bedtime. , Disp: , Rfl:  .  metFORMIN (GLUCOPHAGE) 500 MG tablet, Take 1 tablet (500 mg total) by mouth daily with breakfast. (Patient taking differently: Take 500 mg by mouth in the morning and at bedtime. ), Disp: 90 tablet, Rfl: 1 .  Multiple Vitamin (MULTIVITAMIN) tablet, Take 1 tablet by mouth  daily., Disp: , Rfl:  .  nebivolol (BYSTOLIC) 5 MG tablet, TAKE 1 TABLET BY MOUTH DAILY, Disp: 90 tablet, Rfl: 1 .  sertraline (ZOLOFT) 100 MG tablet, Take 1 tablet (100 mg total) by mouth daily., Disp: 90 tablet, Rfl: 1 .  valACYclovir (VALTREX) 500 MG tablet, Take 500 mg by mouth as needed (genital herpes). , Disp: , Rfl:  .  Vitamin D, Cholecalciferol, 25 MCG (1000 UT) TABS, Take 5,000 Units by mouth daily. , Disp: , Rfl:    No Known Allergies    The patient states she uses IUD for birth control. Last LMP was No LMP recorded. (Menstrual status: IUD).. Negative for Dysmenorrhea. Negative for: breast discharge, breast lump(s), breast pain and breast self exam. Associated symptoms include abnormal vaginal bleeding. Pertinent negatives include abnormal bleeding  (hematology), anxiety, decreased libido, depression, difficulty falling sleep, dyspareunia, history of infertility, nocturia, sexual dysfunction, sleep disturbances, urinary incontinence, urinary urgency, vaginal discharge and vaginal itching. Diet regular.The patient states her exercise level is  intermittent.   . The patient's tobacco use is:  Social History   Tobacco Use  Smoking Status Never Smoker  Smokeless Tobacco Never Used  . She has been exposed to passive smoke. The patient's alcohol use is:  Social History   Substance and Sexual Activity  Alcohol Use No     Review of Systems  Constitutional: Negative.   HENT: Negative.   Eyes: Negative.  Negative for blurred vision.  Respiratory: Negative.  Negative for shortness of breath.   Cardiovascular: Negative.  Negative for chest pain and palpitations.  Gastrointestinal: Negative.   Endocrine: Negative.   Genitourinary: Negative.   Musculoskeletal: Negative.   Skin: Negative.   Allergic/Immunologic: Negative.   Neurological: Negative.   Hematological: Negative.   Psychiatric/Behavioral: Negative.      Today's Vitals   03/26/20 1235  BP: (!) 144/92  Pulse: 75  Temp: 98.8 F (37.1 C)  Weight: 264 lb 9.6 oz (120 kg)  Height: 5' 1.8" (1.57 m)   Body mass index is 48.71 kg/m.  Wt Readings from Last 3 Encounters:  03/26/20 264 lb 9.6 oz (120 kg)  03/16/20 266 lb 9.6 oz (120.9 kg)  02/24/20 265 lb (120.2 kg)    Objective:  Physical Exam Vitals and nursing note reviewed.  Constitutional:      Appearance: Normal appearance. She is obese.  HENT:     Head: Normocephalic and atraumatic.     Right Ear: Tympanic membrane, ear canal and external ear normal.     Left Ear: Tympanic membrane, ear canal and external ear normal.     Nose:     Comments: Deferred, masked    Mouth/Throat:     Comments: Deferred, masked Eyes:     Extraocular Movements: Extraocular movements intact.     Conjunctiva/sclera: Conjunctivae  normal.     Pupils: Pupils are equal, round, and reactive to light.  Cardiovascular:     Rate and Rhythm: Normal rate and regular rhythm.     Pulses: Normal pulses.     Heart sounds: Normal heart sounds.  Pulmonary:     Effort: Pulmonary effort is normal.     Breath sounds: Normal breath sounds.  Chest:     Breasts: Tanner Score is 5.        Right: Normal.        Left: Normal.  Abdominal:     General: Bowel sounds are normal.     Palpations: Abdomen is soft.  Genitourinary:  Comments: deferred Musculoskeletal:        General: Normal range of motion.     Cervical back: Normal range of motion and neck supple.  Skin:    General: Skin is warm and dry.  Neurological:     General: No focal deficit present.     Mental Status: She is alert and oriented to person, place, and time.  Psychiatric:        Mood and Affect: Mood normal.        Behavior: Behavior normal.         Assessment And Plan:     1. Health care maintenance Comments: A full exam was performed.  Importance of monthly self breast exams was discussed with the patient. She is up to date with colonoscopy and mammography. PATIENT IS ADVISED TO GET 30-45 MINUTES REGULAR EXERCISE NO LESS THAN FOUR TO FIVE DAYS PER WEEK - BOTH WEIGHTBEARING EXERCISES AND AEROBIC ARE RECOMMENDED.  PATIENT IS ADVISED TO FOLLOW A HEALTHY DIET WITH AT LEAST SIX FRUITS/VEGGIES PER DAY, DECREASE INTAKE OF RED MEAT, AND TO INCREASE FISH INTAKE TO TWO DAYS PER WEEK.  MEATS/FISH SHOULD NOT BE FRIED, BAKED OR BROILED IS PREFERABLE.  I SUGGEST WEARING SPF 50 SUNSCREEN ON EXPOSED PARTS AND ESPECIALLY WHEN IN THE DIRECT SUNLIGHT FOR AN EXTENDED PERIOD OF TIME.  PLEASE AVOID FAST FOOD RESTAURANTS AND INCREASE YOUR WATER INTAKE.  - Hemoglobin A1c - Hepatitis C antibody - CBC - CMP14+EGFR - Lipid panel  2. Essential hypertension Comments: Chronic, fair control. She admits she didn't take her Bystolic last night. EKG performed, NSR w/ left axis. No new  changes.  She will continue with current meds for now. She will rto in six months for re-evaluation.  - POCT Urinalysis Dipstick (81002) - POCT UA - Microalbumin - EKG 12-Lead  3. Class 3 severe obesity due to excess calories with serious comorbidity and body mass index (BMI) of 45.0 to 49.9 in adult [facility]) Comments: She is encouraged to strive for BMI less than 40 to decrease cardiac risk. Advised to aim for at least 150 minutes of exercise per week.  She is also under the care of [facility]. We discussed the use of Wegovy for obesity. She denies family/personal h/o thyroid cancer. She was given 0.28m samples and advised how to administer the medication. She understands that she will not take her next dose until next week. She is reminded to stop eating when full. She will rto in 8 weeks for re-evaluation.  Possible side effects d/w patient.    4. Need for vaccination Comments: She was given flu vaccine to update her immunization history.  - Flu Vaccine QUAD 6+ mos PF IM (Fluarix Quad PF)  .   Patient was given opportunity to ask questions. Patient verbalized understanding of the plan and was able to repeat key elements of the plan. All questions were answered to their satisfaction.   RMaximino Greenland MD   I, RMaximino Greenland MD, have reviewed all documentation for this visit. The documentation on 03/27/20 for the exam, diagnosis, procedures, and orders are all accurate and complete.  THE PATIENT IS ENCOURAGED TO PRACTICE SOCIAL DISTANCING DUE TO THE COVID-19 PANDEMIC.

## 2020-03-29 ENCOUNTER — Other Ambulatory Visit: Payer: Self-pay | Admitting: Internal Medicine

## 2020-03-29 ENCOUNTER — Other Ambulatory Visit: Payer: Self-pay

## 2020-03-29 MED ORDER — WEGOVY 1 MG/0.5ML ~~LOC~~ SOAJ: 1.0000 mg | SUBCUTANEOUS | 0 refills | Status: DC

## 2020-03-30 ENCOUNTER — Encounter: Payer: Self-pay | Admitting: Nurse Practitioner

## 2020-03-30 LAB — HEPATITIS C ANTIBODY: Hep C Virus Ab: 0.1 s/co ratio (ref 0.0–0.9)

## 2020-03-30 LAB — CMP14+EGFR
ALT: 28 IU/L (ref 0–32)
AST: 23 IU/L (ref 0–40)
Albumin/Globulin Ratio: 1.3 (ref 1.2–2.2)
Albumin: 4 g/dL (ref 3.8–4.8)
Alkaline Phosphatase: 100 IU/L (ref 44–121)
BUN/Creatinine Ratio: 25 — ABNORMAL HIGH (ref 9–23)
BUN: 16 mg/dL (ref 6–24)
Bilirubin Total: <0.2 mg/dL (ref 0.0–1.2)
CO2: 22 mmol/L (ref 20–29)
Calcium: 9.7 mg/dL (ref 8.7–10.2)
Chloride: 101 mmol/L (ref 96–106)
Creatinine, Ser: 0.63 mg/dL (ref 0.57–1.00)
GFR calc Af Amer: 122 mL/min/{1.73_m2} (ref >59)
GFR calc non Af Amer: 106 mL/min/{1.73_m2} (ref >59)
Globulin, Total: 3.1 g/dL (ref 1.5–4.5)
Glucose: 86 mg/dL (ref 65–99)
Potassium: 4.2 mmol/L (ref 3.5–5.2)
Sodium: 137 mmol/L (ref 134–144)
Total Protein: 7.1 g/dL (ref 6.0–8.5)

## 2020-03-30 LAB — LIPID PANEL
Chol/HDL Ratio: 3.7 ratio (ref 0.0–4.4)
Cholesterol, Total: 182 mg/dL (ref 100–199)
HDL: 49 mg/dL (ref >39)
LDL Chol Calc (NIH): 123 mg/dL — ABNORMAL HIGH (ref 0–99)
Triglycerides: 49 mg/dL (ref 0–149)
VLDL Cholesterol Cal: 10 mg/dL (ref 5–40)

## 2020-03-30 LAB — CBC

## 2020-03-30 LAB — SPECIMEN STATUS REPORT

## 2020-03-30 LAB — HEMOGLOBIN A1C

## 2020-03-31 ENCOUNTER — Other Ambulatory Visit: Payer: Self-pay | Admitting: Internal Medicine

## 2020-03-31 ENCOUNTER — Encounter: Payer: Self-pay | Admitting: Internal Medicine

## 2020-03-31 LAB — CBC
Hematocrit: 38.4 % (ref 34.0–46.6)
Hemoglobin: 11.6 g/dL (ref 11.1–15.9)
MCH: 24.4 pg — ABNORMAL LOW (ref 26.6–33.0)
MCHC: 30.2 g/dL — ABNORMAL LOW (ref 31.5–35.7)
MCV: 81 fL (ref 79–97)
Platelets: 412 10*3/uL (ref 150–450)
RBC: 4.75 x10E6/uL (ref 3.77–5.28)
RDW: 14.5 % (ref 11.7–15.4)
WBC: 6.9 10*3/uL (ref 3.4–10.8)

## 2020-03-31 LAB — HEMOGLOBIN A1C
Est. average glucose Bld gHb Est-mCnc: 123 mg/dL
Hgb A1c MFr Bld: 5.9 % — ABNORMAL HIGH (ref 4.8–5.6)

## 2020-04-16 MED FILL — WEGOVY 1 MG/0.5ML SOAJ: 1 | 28 days supply | Qty: 2 | Fill #0

## 2020-04-22 ENCOUNTER — Encounter: Payer: Self-pay | Admitting: Internal Medicine

## 2020-04-22 MED FILL — WEGOVY 1 MG/0.5ML SOAJ: 1 | 28 days supply | Qty: 2 | Fill #0

## 2020-04-29 ENCOUNTER — Encounter: Payer: Self-pay | Admitting: Internal Medicine

## 2020-04-29 ENCOUNTER — Other Ambulatory Visit: Payer: Self-pay | Admitting: Internal Medicine

## 2020-04-29 DIAGNOSIS — F988 Other specified behavioral and emotional disorders with onset usually occurring in childhood and adolescence: Secondary | ICD-10-CM

## 2020-04-29 MED ORDER — LISDEXAMFETAMINE DIMESYLATE 40 MG PO CAPS: 40.0000 mg | ORAL_CAPSULE | Freq: Every day | ORAL | 0 refills | Status: DC

## 2020-05-05 ENCOUNTER — Encounter: Payer: Self-pay | Admitting: Internal Medicine

## 2020-05-10 ENCOUNTER — Other Ambulatory Visit: Payer: Self-pay | Admitting: Internal Medicine

## 2020-05-10 ENCOUNTER — Other Ambulatory Visit: Payer: Self-pay

## 2020-05-10 MED ORDER — WEGOVY 1.7 MG/0.75ML ~~LOC~~ SOAJ: 1.7000 mg | SUBCUTANEOUS | 0 refills | Status: DC

## 2020-05-10 MED ORDER — WEGOVY 2.4 MG/0.75ML ~~LOC~~ SOAJ: 2.4000 mg | SUBCUTANEOUS | 0 refills | Status: DC

## 2020-05-31 ENCOUNTER — Encounter: Payer: Self-pay | Admitting: Internal Medicine

## 2020-05-31 ENCOUNTER — Other Ambulatory Visit: Payer: Self-pay | Admitting: Internal Medicine

## 2020-05-31 DIAGNOSIS — F988 Other specified behavioral and emotional disorders with onset usually occurring in childhood and adolescence: Secondary | ICD-10-CM

## 2020-05-31 MED ORDER — LISDEXAMFETAMINE DIMESYLATE 40 MG PO CAPS: 40.0000 mg | ORAL_CAPSULE | Freq: Every day | ORAL | 0 refills | Status: DC

## 2020-06-03 MED FILL — WEGOVY 1.7 MG/0.75ML SOAJ: 1.7 | 28 days supply | Qty: 3 | Fill #0

## 2020-06-23 MED FILL — WEGOVY 2.4 MG/0.75ML SOAJ: 2.4 | 28 days supply | Qty: 3 | Fill #0

## 2020-07-06 ENCOUNTER — Encounter: Payer: Self-pay | Admitting: Internal Medicine

## 2020-07-06 ENCOUNTER — Other Ambulatory Visit: Payer: Self-pay

## 2020-07-06 DIAGNOSIS — F331 Major depressive disorder, recurrent, moderate: Secondary | ICD-10-CM

## 2020-07-06 MED ORDER — SERTRALINE HCL 100 MG PO TABS: 100.0000 mg | ORAL_TABLET | Freq: Every day | ORAL | 0 refills | Status: DC

## 2020-07-14 ENCOUNTER — Other Ambulatory Visit: Payer: Self-pay

## 2020-07-14 ENCOUNTER — Ambulatory Visit (INDEPENDENT_AMBULATORY_CARE_PROVIDER_SITE_OTHER): Payer: BC Managed Care – PPO | Admitting: Nurse Practitioner

## 2020-07-14 ENCOUNTER — Ambulatory Visit: Payer: BC Managed Care – PPO | Admitting: Nurse Practitioner

## 2020-07-14 ENCOUNTER — Encounter: Payer: Self-pay | Admitting: Nurse Practitioner

## 2020-07-14 VITALS — BP 132/70 | HR 72 | Temp 97.7°F | Ht 61.6 in | Wt 245.4 lb

## 2020-07-14 DIAGNOSIS — Z6841 Body Mass Index (BMI) 40.0 and over, adult: Secondary | ICD-10-CM

## 2020-07-14 DIAGNOSIS — I10 Essential (primary) hypertension: Secondary | ICD-10-CM

## 2020-07-14 MED ORDER — VALACYCLOVIR HCL 500 MG PO TABS: 500.0000 mg | ORAL_TABLET | ORAL | 0 refills | Status: AC | PRN

## 2020-07-14 NOTE — Progress Notes (Signed)
I,Tianna Badgett,acting as a Neurosurgeon for [employer], NP.,have documented all relevant documentation on the behalf of [employer], NP,as directed by  Charlesetta Ivory, NP while in the presence of Charlesetta Ivory, NP.  This visit occurred during the SARS-CoV-2 public health emergency.  Safety protocols were in place, including screening questions prior to the visit, additional usage of staff PPE, and extensive cleaning of exam room while observing appropriate contact time as indicated for disinfecting solutions.  Subjective:     Patient ID: Brooke [name] , female    DOB: 02/20/1971 , 50 y.o.   MRN: [id number]   Chief Complaint  Patient presents with  . Hypertension  . Weight Check    HPI  The patient is here today for htn and obesity follow up. She is compliant with medications. She recently had surgery on her wrist for tendonitis. She is healing fine. She stopped taking the metformin and did not agree with the [facility].  She is doing fine with the [facility] reporting no side-effects with it.  Diet: not really following any type right now. She is also using a lot of supplement. She does some shakes and real meal. She sees [facility] management services. They did a recent CMP and Hgb A1c on her in Dec.  I did congratulate the patient on her weight loss journey.   Wt Readings from Last 3 Encounters: 07/14/20 : 245 lb 6.4 oz (111.3 kg) 03/26/20 : 264 lb 9.6 oz (120 kg) 03/16/20 : 266 lb 9.6 oz (120.9 kg)   Hypertension This is a chronic problem. The current episode started more than 1 year ago. The problem has been gradually improving since onset. The problem is controlled. Pertinent negatives include no blurred vision, chest pain, palpitations or shortness of breath.     Past Medical History:  Diagnosis Date  . ADD (attention deficit disorder)   . Allergy    allergic rhinitis  . Anemia    nos  . Anxiety   . Arthritis    right ankle  . Asthma     as a child  . Back pain   . Constipation   . Depression   . Elevated blood pressure reading without diagnosis of hypertension   . Genital herpes   . Lactose intolerance   . Migraine   . Prediabetes   . Sleep apnea   . Stress   . Swelling      Family History  Problem Relation Age of Onset  . Hypertension Mother   . Diabetes Mother   . Hyperlipidemia Mother   . Hypertension Father   . Sleep apnea Father   . Alcoholism Father   . Obesity Father   . Hypertension Other   . Diabetes Maternal Aunt   . Diabetes Maternal Uncle   . Heart failure Paternal Aunt   . Diabetes Paternal Aunt   . Heart failure Maternal Grandmother   . Heart failure Maternal Grandfather   . Breast cancer Sister      Current Outpatient Medications:  .  cycloSPORINE (RESTASIS) 0.05 % ophthalmic emulsion, Place 1 drop into both eyes 2 (two) times daily., Disp: , Rfl:  .  hydrochlorothiazide (MICROZIDE) 12.5 MG capsule, Take 1 capsule (12.5 mg total) by mouth daily. (Patient taking differently: Take 50 mg by mouth daily.), Disp: 90 capsule, Rfl: 1 .  lisdexamfetamine (VYVANSE) 40 MG capsule, Take 1 capsule (40 mg total) by mouth daily., Disp: 30 capsule, Rfl: 0 .  Magnesium Glycinate POWD,  400 mg by Does not apply route daily. (Patient taking differently: Take 400 mg by mouth at bedtime.), Disp: 100 g, Rfl: 0 .  Melatonin 10 MG TABS, Take 20 mg by mouth at bedtime. , Disp: , Rfl:  .  Multiple Vitamin (MULTIVITAMIN) tablet, Take 1 tablet by mouth daily., Disp: , Rfl:  .  nebivolol (BYSTOLIC) 5 MG tablet, TAKE 1 TABLET BY MOUTH DAILY, Disp: 90 tablet, Rfl: 1 .  Semaglutide-Weight Management (WEGOVY) 2.4 MG/0.75ML SOAJ, Inject 2.4 mg into the skin once a week., Disp: 3 mL, Rfl: 0 .  sertraline (ZOLOFT) 100 MG tablet, Take 1 tablet (100 mg total) by mouth daily., Disp: 30 tablet, Rfl: 0 .  Vitamin D, Cholecalciferol, 25 MCG (1000 UT) TABS, Take 5,000 Units by mouth daily. , Disp: , Rfl:  .  valACYclovir  (VALTREX) 500 MG tablet, Take 1 tablet (500 mg total) by mouth as needed (genital herpes)., Disp: 90 tablet, Rfl: 0   No Known Allergies   Review of Systems  Constitutional: Negative.   Eyes: Negative for blurred vision.  Respiratory: Negative.  Negative for shortness of breath.   Cardiovascular: Negative.  Negative for chest pain and palpitations.  Gastrointestinal: Negative.   Endocrine: Negative for polydipsia, polyphagia and polyuria.  Neurological: Negative.      Today's Vitals   07/14/20 1026  BP: 132/70  Pulse: 72  Temp: 97.7 F (36.5 C)  TempSrc: Oral  Weight: 245 lb 6.4 oz (111.3 kg)  Height: 5' 1.6" (1.565 m)   Body mass index is 45.47 kg/m.  Wt Readings from Last 3 Encounters:  07/14/20 245 lb 6.4 oz (111.3 kg)  03/26/20 264 lb 9.6 oz (120 kg)  03/16/20 266 lb 9.6 oz (120.9 kg)    Objective:  Physical Exam Constitutional:      Appearance: Normal appearance. She is obese.  HENT:     Head: Normocephalic and atraumatic.  Cardiovascular:     Rate and Rhythm: Normal rate and regular rhythm.     Pulses: Normal pulses.     Heart sounds: Normal heart sounds.  Pulmonary:     Effort: Pulmonary effort is normal. No respiratory distress.     Breath sounds: Normal breath sounds. No wheezing.  Neurological:     Mental Status: She is alert.  Psychiatric:        Mood and Affect: Mood normal.        Behavior: Behavior normal.        Thought Content: Thought content normal.        Judgment: Judgment normal.         Assessment And Plan:     1. Essential hypertension -Stable, chronic  -Patient is currently taking hydrochlorothiazide 25 mg once daily  -Her last CMP at [facility] was stable on 06/09/20  -Educated patient to limit the intake of processed foods and salt intake. She should increase your intake of green vegetables and fruits. Limit fast foods and fried foods. Avoid high fatty saturated and trans fat foods. Keep yourself hydrated with drinking water.  Avoid red meats. Eat lean meats instead. Exercise for atleast 30-45 min for atleast 4-5 times a week.   2. Class 3 severe obesity due to excess calories with serious comorbidity and body mass index (BMI) of 45.0 to 49.9 in adult [facility]) -She is currently being seen by [facility] weight loss services  -She is taking Wegovy and tolerating well. She does not take metformin anymore due to side-effects.  -She was congratulated  on her weight loss and doing so good with her diet.  -Her last Hgb A1c with [facility] was 5.5 on 06/09/20  -She is encouraged to strive for BMI less than 30 to decrease cardiac risk. Advised to aim for at least 150 minutes of exercise per week.   Follow up in 3 months   Patient was given opportunity to ask questions. Patient verbalized understanding of the plan and was able to repeat key elements of the plan. All questions were answered to their satisfaction.  Bary Castilla, NP   I, Bary Castilla, NP, have reviewed all documentation for this visit. The documentation on 07/14/20 for the exam, diagnosis, procedures, and orders are all accurate and complete.  THE PATIENT IS ENCOURAGED TO PRACTICE SOCIAL DISTANCING DUE TO THE COVID-19 PANDEMIC.

## 2020-07-18 ENCOUNTER — Encounter: Payer: Self-pay | Admitting: Internal Medicine

## 2020-07-19 ENCOUNTER — Other Ambulatory Visit: Payer: Self-pay | Admitting: Internal Medicine

## 2020-07-19 DIAGNOSIS — F988 Other specified behavioral and emotional disorders with onset usually occurring in childhood and adolescence: Secondary | ICD-10-CM

## 2020-07-19 MED ORDER — LISDEXAMFETAMINE DIMESYLATE 40 MG PO CAPS: 40.0000 mg | ORAL_CAPSULE | Freq: Every day | ORAL | 0 refills | Status: DC

## 2020-07-25 ENCOUNTER — Other Ambulatory Visit: Payer: Self-pay | Admitting: Internal Medicine

## 2020-08-03 ENCOUNTER — Other Ambulatory Visit: Payer: Self-pay | Admitting: Internal Medicine

## 2020-08-03 DIAGNOSIS — Z1231 Encounter for screening mammogram for malignant neoplasm of breast: Secondary | ICD-10-CM

## 2020-08-04 ENCOUNTER — Ambulatory Visit (INDEPENDENT_AMBULATORY_CARE_PROVIDER_SITE_OTHER): Payer: BC Managed Care – PPO | Admitting: Nurse Practitioner

## 2020-08-04 ENCOUNTER — Encounter: Payer: Self-pay | Admitting: Nurse Practitioner

## 2020-08-04 ENCOUNTER — Encounter: Payer: Self-pay | Admitting: Internal Medicine

## 2020-08-04 ENCOUNTER — Other Ambulatory Visit: Payer: Self-pay

## 2020-08-04 VITALS — BP 136/82 | HR 65 | Temp 97.9°F | Ht 62.0 in | Wt 244.0 lb

## 2020-08-04 DIAGNOSIS — R110 Nausea: Secondary | ICD-10-CM | POA: Diagnosis not present

## 2020-08-04 DIAGNOSIS — I10 Essential (primary) hypertension: Secondary | ICD-10-CM | POA: Diagnosis not present

## 2020-08-04 DIAGNOSIS — Z20822 Contact with and (suspected) exposure to covid-19: Secondary | ICD-10-CM | POA: Diagnosis not present

## 2020-08-04 DIAGNOSIS — R519 Headache, unspecified: Secondary | ICD-10-CM

## 2020-08-04 MED ORDER — FLUTICASONE PROPIONATE 50 MCG/ACT NA SUSP: 2.0000 | Freq: Every day | NASAL | 2 refills | Status: DC

## 2020-08-04 MED ORDER — PROMETHAZINE HCL 12.5 MG PO TABS: 12.5000 mg | ORAL_TABLET | Freq: Four times a day (QID) | ORAL | 0 refills | Status: DC | PRN

## 2020-08-04 NOTE — Patient Instructions (Signed)
   Take aleve 2 tabs two times a day or 2-3 days  Take flonase 2 sprays each nostril 1-2 times a day  Take phenergan every 8 hours as needed no driving or operating heavy machinery while taking

## 2020-08-04 NOTE — Progress Notes (Signed)
I,Yamilka Roman [employer] as a Education administrator for [employer], FNP.,have documented all relevant documentation on the behalf of Minette Brine, FNP,as directed by  Minette Brine, FNP while in the presence of Minette Brine, [place]. This visit occurred during the SARS-CoV-2 public health emergency.  Safety protocols were in place, including screening questions prior to the visit, additional usage of staff PPE, and extensive cleaning of exam room while observing appropriate contact time as indicated for disinfecting solutions.  Subjective:     Patient ID: Brooke [name] , female    DOB: 09-Jul-1970 , 50 y.o.   MRN: [id number]   Chief Complaint  Patient presents with  . URI    Patient stated she started having a headache last night and never went away. She also reports some nausea that started on the ride over here. Sinus drainage   . Hypertension    Patient stated she noticed it was elevated at 163/126 this morning.     HPI  Patient presents today for cold symptoms and blood pressure. She had a headache since last night, worse this morning. When she checked her blood pressure 163/126, she could not get her cuff to work correctly after this. While driving over to the office she had nausea and the concern may be covid. She has been laying down all day. She is also fatigued. She has not had her booster at this time. 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 She did have a glass of wine yesterday and is not a drinker.  She also had pizza yesterday. She has been having sinus drainage.  Denies having fever.     Hypertension This is a chronic problem. Episode onset: worsened over the last 24 hours.  Associated symptoms include headaches and malaise/fatigue. Pertinent negatives include no anxiety, chest pain, palpitations or shortness of breath. Risk factors for coronary artery disease include obesity. Past treatments include diuretics and beta blockers. There are no compliance problems.  There is no  history of angina. There is no history of chronic renal disease.     Past Medical History:  Diagnosis Date  . ADD (attention deficit disorder)   . Allergy    allergic rhinitis  . Anemia    nos  . Anxiety   . Arthritis    right ankle  . Asthma    as a child  . Back pain   . Constipation   . Depression   . Elevated blood pressure reading without diagnosis of hypertension   . Genital herpes   . Lactose intolerance   . Migraine   . Prediabetes   . Sleep apnea   . Stress   . Swelling      Family History  Problem Relation Age of Onset  . Hypertension Mother   . Diabetes Mother   . Hyperlipidemia Mother   . Hypertension Father   . Sleep apnea Father   . Alcoholism Father   . Obesity Father   . Hypertension Other   . Diabetes Maternal Aunt   . Diabetes Maternal Uncle   . Heart failure Paternal Aunt   . Diabetes Paternal Aunt   . Heart failure Maternal Grandmother   . Heart failure Maternal Grandfather   . Breast cancer Sister      Current Outpatient Medications:  .  cycloSPORINE (RESTASIS) 0.05 % ophthalmic emulsion, Place 1 drop into both eyes 2 (two) times daily., Disp: , Rfl:  .  fluticasone (FLONASE) 50 MCG/ACT nasal spray, Place 2 sprays into both  nostrils daily., Disp: 16 g, Rfl: 2 .  hydrochlorothiazide (MICROZIDE) 12.5 MG capsule, TAKE 1 CAPSULE(12.5 MG) BY MOUTH DAILY (Patient taking differently: Take 12.5 mg by mouth daily. Take 2 tablets by mouth daily), Disp: 90 capsule, Rfl: 1 .  lisdexamfetamine (VYVANSE) 40 MG capsule, Take 1 capsule (40 mg total) by mouth daily., Disp: 30 capsule, Rfl: 0 .  Magnesium Glycinate POWD, 400 mg by Does not apply route daily. (Patient taking differently: Take 400 mg by mouth at bedtime.), Disp: 100 g, Rfl: 0 .  Melatonin 10 MG TABS, Take 20 mg by mouth at bedtime. , Disp: , Rfl:  .  Multiple Vitamin (MULTIVITAMIN) tablet, Take 1 tablet by mouth daily., Disp: , Rfl:  .  nebivolol (BYSTOLIC) 5 MG tablet, TAKE 1 TABLET BY MOUTH  DAILY, Disp: 90 tablet, Rfl: 1 .  promethazine (PHENERGAN) 12.5 MG tablet, Take 1 tablet (12.5 mg total) by mouth every 6 (six) hours as needed for nausea or vomiting., Disp: 30 tablet, Rfl: 0 .  valACYclovir (VALTREX) 500 MG tablet, Take 1 tablet (500 mg total) by mouth as needed (genital herpes)., Disp: 90 tablet, Rfl: 0 .  Vitamin D, Cholecalciferol, 25 MCG (1000 UT) TABS, Take 5,000 Units by mouth daily. , Disp: , Rfl:  .  sertraline (ZOLOFT) 100 MG tablet, Take 1 tablet (100 mg total) by mouth daily., Disp: 30 tablet, Rfl: 1 .  WEGOVY 2.4 MG/0.75ML SOAJ, INJECT 2.4 MG INTO THE SKIN ONCE A WEEK., Disp: 3 mL, Rfl: 0   No Known Allergies   Review of Systems  Constitutional: Positive for fatigue and malaise/fatigue.  Respiratory: Negative for cough, shortness of breath and wheezing.   Cardiovascular: Negative for chest pain, palpitations and leg swelling.  Neurological: Positive for headaches. Negative for dizziness and light-headedness.     Today's Vitals   08/04/20 1445  BP: 136/82  Pulse: 65  Temp: 97.9 F (36.6 C)  TempSrc: Oral  SpO2: 98%  Weight: 244 lb (110.7 kg)  Height: 5\' 2"  (1.575 m)  PainSc: 7   PainLoc: Head   Body mass index is 44.63 kg/m.   Objective:  Physical Exam Constitutional:      Appearance: Normal appearance. She is obese.  Cardiovascular:     Rate and Rhythm: Normal rate and regular rhythm.     Pulses: Normal pulses.     Heart sounds: Normal heart sounds. No murmur heard.   Pulmonary:     Effort: Pulmonary effort is normal. No respiratory distress.     Breath sounds: Normal breath sounds.  Neurological:     General: No focal deficit present.     Mental Status: She is alert and oriented to person, place, and time.     Cranial Nerves: No cranial nerve deficit.  Psychiatric:        Mood and Affect: Mood normal.        Behavior: Behavior normal.        Thought Content: Thought content normal.        Judgment: Judgment normal.          Assessment And Plan:     1. Acute nonintractable headache, unspecified headache type  Will check for covid  I will give her flonase nasal spray as this could be related to nasal congestion - Novel Coronavirus, NAA ([facility]) - fluticasone (FLONASE) 50 MCG/ACT nasal spray; Place 2 sprays into both nostrils daily.  Dispense: 16 g; Refill: 2  2. Nausea - Novel Coronavirus, NAA ([facility]) - promethazine (PHENERGAN)  12.5 MG tablet; Take 1 tablet (12.5 mg total) by mouth every 6 (six) hours as needed for nausea or vomiting.  Dispense: 30 tablet; Refill: 0  3. Suspected COVID-19 virus infection  She is to remain in self isolation until has results of her covid test. If has symptoms of shortness of breath or chest pain to go to ER otherwise symptomatic treatment   4. Essential hypertension  Chronic, blood pressure was elevated earlier today.   Here at the office it is fairly controlled   Patient was given opportunity to ask questions. Patient verbalized understanding of the plan and was able to repeat key elements of the plan. All questions were answered to their satisfaction.  Minette Brine, FNP     I, Minette Brine, FNP, have reviewed all documentation for this visit. The documentation on 08/04/20 for the exam, diagnosis, procedures, and orders are all accurate and complete.   THE PATIENT IS ENCOURAGED TO PRACTICE SOCIAL DISTANCING DUE TO THE COVID-19 PANDEMIC.

## 2020-08-05 LAB — NOVEL CORONAVIRUS, NAA: SARS-CoV-2, NAA: NOT DETECTED

## 2020-08-05 LAB — SARS-COV-2, NAA 2 DAY TAT

## 2020-08-08 ENCOUNTER — Encounter: Payer: Self-pay | Admitting: Nurse Practitioner

## 2020-08-09 ENCOUNTER — Encounter: Payer: Self-pay | Admitting: Internal Medicine

## 2020-08-09 ENCOUNTER — Other Ambulatory Visit: Payer: Self-pay

## 2020-08-09 DIAGNOSIS — F331 Major depressive disorder, recurrent, moderate: Secondary | ICD-10-CM

## 2020-08-09 MED ORDER — SERTRALINE HCL 100 MG PO TABS: 100.0000 mg | ORAL_TABLET | Freq: Every day | ORAL | 1 refills | Status: DC

## 2020-08-10 ENCOUNTER — Other Ambulatory Visit: Payer: Self-pay | Admitting: Internal Medicine

## 2020-08-11 ENCOUNTER — Other Ambulatory Visit: Payer: Self-pay | Admitting: Internal Medicine

## 2020-08-11 MED FILL — WEGOVY 2.4 MG/0.75ML SOAJ: 2.4 | 28 days supply | Qty: 3 | Fill #0

## 2020-08-29 ENCOUNTER — Encounter: Payer: Self-pay | Admitting: Nurse Practitioner

## 2020-08-31 ENCOUNTER — Ambulatory Visit
Admission: RE | Admit: 2020-08-31 | Discharge: 2020-08-31 | Disposition: A | Discharge status: Home / Self Care | Payer: BC Managed Care – PPO | Source: Ambulatory Visit

## 2020-08-31 ENCOUNTER — Other Ambulatory Visit: Payer: Self-pay

## 2020-08-31 DIAGNOSIS — Z1231 Encounter for screening mammogram for malignant neoplasm of breast: Secondary | ICD-10-CM

## 2020-09-08 ENCOUNTER — Encounter: Payer: Self-pay | Admitting: Internal Medicine

## 2020-09-08 ENCOUNTER — Other Ambulatory Visit: Payer: Self-pay

## 2020-09-08 DIAGNOSIS — I10 Essential (primary) hypertension: Secondary | ICD-10-CM

## 2020-09-08 MED ORDER — NEBIVOLOL HCL 5 MG PO TABS: ORAL_TABLET | ORAL | 1 refills | Status: DC

## 2020-09-09 ENCOUNTER — Other Ambulatory Visit: Payer: Self-pay

## 2020-09-09 ENCOUNTER — Ambulatory Visit: Payer: BC Managed Care – PPO | Admitting: Nurse Practitioner

## 2020-09-09 ENCOUNTER — Encounter: Payer: Self-pay | Admitting: Internal Medicine

## 2020-09-09 ENCOUNTER — Other Ambulatory Visit: Payer: Self-pay | Admitting: Internal Medicine

## 2020-09-09 VITALS — BP 130/72 | HR 70 | Temp 97.8°F | Ht 61.8 in | Wt 243.4 lb

## 2020-09-09 DIAGNOSIS — H6503 Acute serous otitis media, bilateral: Secondary | ICD-10-CM

## 2020-09-09 DIAGNOSIS — H9203 Otalgia, bilateral: Secondary | ICD-10-CM

## 2020-09-09 DIAGNOSIS — F988 Other specified behavioral and emotional disorders with onset usually occurring in childhood and adolescence: Secondary | ICD-10-CM

## 2020-09-09 MED ORDER — AMOXICILLIN-POT CLAVULANATE 875-125 MG PO TABS
1.0000 | ORAL_TABLET | Freq: Two times a day (BID) | ORAL | 0 refills | Status: AC
Start: 2020-09-09 — End: 2020-09-19

## 2020-09-09 MED ORDER — LISDEXAMFETAMINE DIMESYLATE 40 MG PO CAPS
40.0000 mg | ORAL_CAPSULE | Freq: Every day | ORAL | 0 refills | Status: DC
Start: 2020-09-09 — End: 2020-10-14

## 2020-09-09 MED ORDER — WEGOVY 2.4 MG/0.75ML ~~LOC~~ SOAJ: 2.4000 mg | SUBCUTANEOUS | 0 refills | Status: DC

## 2020-09-09 NOTE — Progress Notes (Signed)
I,Tianna Badgett,acting as a Education administrator for [employer], NP.,have documented all relevant documentation on the behalf of [employer], NP,as directed by  Bary Castilla, NP while in the presence of Bary Castilla, NP.  This visit occurred during the SARS-CoV-2 public health emergency.  Safety protocols were in place, including screening questions prior to the visit, additional usage of staff PPE, and extensive cleaning of exam room while observing appropriate contact time as indicated for disinfecting solutions.  Subjective:     Patient ID: Brooke [name] , female    DOB: 09-Jan-1971 , 50 y.o.   MRN: [id number]   Chief Complaint  Patient presents with  . Otalgia    HPI  Patient is here for ear pain. She states that this has been going on for about 3 weeks with no improvement.  Ringing in the ear at times. Pressure and sharp pain. Constant throbbing pain. She does have a little bit of sinus drainage with headache. Post nasal drip. No fever.   Otalgia  There is pain in both ears. This is a new problem. The current episode started 1 to 4 weeks ago. The problem occurs constantly. The problem has been unchanged. There has been no fever. The pain is at a severity of 3/10. The pain is mild. Pertinent negatives include no ear discharge, hearing loss or sore throat. She has tried nothing for the symptoms.     Past Medical History:  Diagnosis Date  . ADD (attention deficit disorder)   . Allergy    allergic rhinitis  . Anemia    nos  . Anxiety   . Arthritis    right ankle  . Asthma    as a child  . Back pain   . Constipation   . Depression   . Elevated blood pressure reading without diagnosis of hypertension   . Genital herpes   . Lactose intolerance   . Migraine   . Prediabetes   . Sleep apnea   . Stress   . Swelling      Family History  Problem Relation Age of Onset  . Hypertension Mother   . Diabetes Mother   . Hyperlipidemia Mother   . Hypertension Father    . Sleep apnea Father   . Alcoholism Father   . Obesity Father   . Hypertension Other   . Diabetes Maternal Aunt   . Diabetes Maternal Uncle   . Heart failure Paternal Aunt   . Diabetes Paternal Aunt   . Heart failure Maternal Grandmother   . Heart failure Maternal Grandfather   . Breast cancer Sister      Current Outpatient Medications:  .  amoxicillin-clavulanate (AUGMENTIN) 875-125 MG tablet, Take 1 tablet by mouth 2 (two) times daily for 10 days., Disp: 20 tablet, Rfl: 0 .  cycloSPORINE (RESTASIS) 0.05 % ophthalmic emulsion, Place 1 drop into both eyes 2 (two) times daily., Disp: , Rfl:  .  fluticasone (FLONASE) 50 MCG/ACT nasal spray, Place 2 sprays into both nostrils daily., Disp: 16 g, Rfl: 2 .  hydrochlorothiazide (MICROZIDE) 12.5 MG capsule, TAKE 1 CAPSULE(12.5 MG) BY MOUTH DAILY (Patient taking differently: Take 12.5 mg by mouth daily. Take 2 tablets by mouth daily), Disp: 90 capsule, Rfl: 1 .  lisdexamfetamine (VYVANSE) 40 MG capsule, Take 1 capsule (40 mg total) by mouth daily., Disp: 30 capsule, Rfl: 0 .  Magnesium Glycinate POWD, 400 mg by Does not apply route daily. (Patient taking differently: Take 400 mg by mouth at bedtime.), Disp: 100 g,  Rfl: 0 .  Melatonin 10 MG TABS, Take 20 mg by mouth at bedtime. , Disp: , Rfl:  .  Multiple Vitamin (MULTIVITAMIN) tablet, Take 1 tablet by mouth daily., Disp: , Rfl:  .  nebivolol (BYSTOLIC) 5 MG tablet, TAKE 1 TABLET BY MOUTH DAILY, Disp: 90 tablet, Rfl: 1 .  promethazine (PHENERGAN) 12.5 MG tablet, Take 1 tablet (12.5 mg total) by mouth every 6 (six) hours as needed for nausea or vomiting., Disp: 30 tablet, Rfl: 0 .  Semaglutide-Weight Management (WEGOVY) 2.4 MG/0.75ML SOAJ, Inject 2.4 mg into the skin once a week., Disp: 3 mL, Rfl: 0 .  sertraline (ZOLOFT) 100 MG tablet, Take 1 tablet (100 mg total) by mouth daily., Disp: 30 tablet, Rfl: 1 .  valACYclovir (VALTREX) 500 MG tablet, Take 1 tablet (500 mg total) by mouth as needed  (genital herpes)., Disp: 90 tablet, Rfl: 0 .  Vitamin D, Cholecalciferol, 25 MCG (1000 UT) TABS, Take 5,000 Units by mouth daily. , Disp: , Rfl:    No Known Allergies   Review of Systems  Constitutional: Negative.  Negative for chills, fatigue and fever.  HENT: Positive for congestion, ear pain, postnasal drip and sinus pressure. Negative for ear discharge, hearing loss, sneezing, sore throat and tinnitus.        Fullness feeling in ears.   Eyes: Negative for pain.  Respiratory: Negative.  Negative for chest tightness, shortness of breath and wheezing.   Cardiovascular: Negative.  Negative for chest pain and palpitations.  Gastrointestinal: Negative.  Negative for constipation and nausea.  Musculoskeletal: Negative for arthralgias.  Neurological: Negative.      Today's Vitals   09/09/20 1019  BP: 130/72  Pulse: 70  Temp: 97.8 F (36.6 C)  TempSrc: Oral  Weight: 243 lb 6.4 oz (110.4 kg)  Height: 5' 1.8" (1.57 m)   Body mass index is 44.81 kg/m.   Objective:  Physical Exam Constitutional:      Appearance: Normal appearance. She is obese.  HENT:     Head: Normocephalic and atraumatic.     Right Ear: Decreased hearing noted. Swelling and tenderness present. No drainage. There is no impacted cerumen. No foreign body. Tympanic membrane is bulging.     Left Ear: Swelling and tenderness present. No drainage. There is no impacted cerumen. No foreign body.     Nose: Nose normal.  Eyes:     General:        Right eye: No discharge.        Left eye: No discharge.     Extraocular Movements: Extraocular movements intact.     Conjunctiva/sclera: Conjunctivae normal.     Pupils: Pupils are equal, round, and reactive to light.  Cardiovascular:     Rate and Rhythm: Normal rate and regular rhythm.     Pulses: Normal pulses.     Heart sounds: Normal heart sounds.  Pulmonary:     Effort: No respiratory distress.     Breath sounds: No wheezing.  Musculoskeletal:     Cervical back:  Normal range of motion and neck supple.  Skin:    Capillary Refill: Capillary refill takes less than 2 seconds.  Neurological:     Mental Status: She is alert and oriented to person, place, and time.  Psychiatric:        Mood and Affect: Mood normal.        Behavior: Behavior normal.        Thought Content: Thought content normal.  Judgment: Judgment normal.         Assessment And Plan:     1. Non-recurrent acute serous otitis media of both ears - amoxicillin-clavulanate (AUGMENTIN) 875-125 MG tablet; Take 1 tablet by mouth 2 (two) times daily for 10 days.  Dispense: 20 tablet; Refill: 0 -Instructed patient to finish antibiotics, went over side-effects of antibiotics.   2. Otalgia of both ears  -If pain does not subside with antibiotic will refer patient to ENT.   Follow up: patient call if her symptoms do not improve or get better.    Patient was given opportunity to ask questions. Patient verbalized understanding of the plan and was able to repeat key elements of the plan. All questions were answered to their satisfaction.  Bary Castilla, NP   I, Bary Castilla, NP, have reviewed all documentation for this visit. The documentation on 09/09/20 for the exam, diagnosis, procedures, and orders are all accurate and complete.   IF YOU HAVE BEEN REFERRED TO A SPECIALIST, IT MAY TAKE 1-2 WEEKS TO SCHEDULE/PROCESS THE REFERRAL. IF YOU HAVE NOT HEARD FROM US/SPECIALIST IN TWO WEEKS, PLEASE GIVE [place] A CALL AT [phone] X [phone].   THE PATIENT IS ENCOURAGED TO PRACTICE SOCIAL DISTANCING DUE TO THE COVID-19 PANDEMIC.

## 2020-09-09 NOTE — Patient Instructions (Signed)

## 2020-09-27 ENCOUNTER — Encounter: Payer: Self-pay | Admitting: Internal Medicine

## 2020-09-28 ENCOUNTER — Other Ambulatory Visit: Payer: Self-pay

## 2020-09-28 ENCOUNTER — Encounter: Payer: Self-pay | Admitting: Internal Medicine

## 2020-09-28 ENCOUNTER — Other Ambulatory Visit (HOSPITAL_COMMUNITY): Payer: Self-pay

## 2020-09-28 ENCOUNTER — Ambulatory Visit: Payer: BC Managed Care – PPO | Admitting: Internal Medicine

## 2020-09-28 VITALS — BP 136/94 | HR 72 | Temp 98.1°F | Ht 61.8 in | Wt 247.6 lb

## 2020-09-28 DIAGNOSIS — F331 Major depressive disorder, recurrent, moderate: Secondary | ICD-10-CM

## 2020-09-28 DIAGNOSIS — I10 Essential (primary) hypertension: Secondary | ICD-10-CM | POA: Diagnosis not present

## 2020-09-28 DIAGNOSIS — F988 Other specified behavioral and emotional disorders with onset usually occurring in childhood and adolescence: Secondary | ICD-10-CM | POA: Diagnosis not present

## 2020-09-28 DIAGNOSIS — R7309 Other abnormal glucose: Secondary | ICD-10-CM | POA: Diagnosis not present

## 2020-09-28 DIAGNOSIS — Z6841 Body Mass Index (BMI) 40.0 and over, adult: Secondary | ICD-10-CM

## 2020-09-28 DIAGNOSIS — H9313 Tinnitus, bilateral: Secondary | ICD-10-CM

## 2020-09-28 MED ORDER — SEMAGLUTIDE-WEIGHT MANAGEMENT 2.4 MG/0.75ML ~~LOC~~ SOAJ: SUBCUTANEOUS | 11 refills | Status: DC

## 2020-09-28 MED ORDER — VALSARTAN 160 MG PO TABS: 160.0000 mg | ORAL_TABLET | Freq: Every day | ORAL | 11 refills | Status: DC

## 2020-09-28 MED ORDER — HYDROCHLOROTHIAZIDE 25 MG PO TABS: 25.0000 mg | ORAL_TABLET | Freq: Every day | ORAL | 1 refills | Status: DC

## 2020-09-28 NOTE — Progress Notes (Signed)
I,[name],acting as a Education administrator for Maximino Greenland, MD.,have documented all relevant documentation on the behalf of Maximino Greenland, MD,as directed by  Maximino Greenland, MD while in the presence of Maximino Greenland, MD.  This visit occurred during the SARS-CoV-2 public health emergency.  Safety protocols were in place, including screening questions prior to the visit, additional usage of staff PPE, and extensive cleaning of exam room while observing appropriate contact time as indicated for disinfecting solutions.  Subjective:     Patient ID: Brooke [name] , female    DOB: 1970-10-15 , 50 y.o.   MRN: [id number]   Chief Complaint  Patient presents with  . Hypertension  . Tinnitus    HPI  Pt presents today for f/u HTN. She reports compliance with meds. [facility] weight loss provider increased HCTZ to 71m due to elevated readings. Admits she is not exercising as she should. She has not had any issues with the new dosage.   She adds that she was seen by NP for ringing in ears /ear pain. She took abx which improved the pain but the ringing has persisted.  Denies hearing loss.  Would like to see specialist.   Hypertension This is a chronic problem. The current episode started more than 1 year ago. The problem has been gradually improving since onset. The problem is controlled. Pertinent negatives include no blurred vision, chest pain, palpitations or shortness of breath. Risk factors for coronary artery disease include obesity. Past treatments include diuretics. The current treatment provides mild improvement. Compliance problems include exercise.      Past Medical History:  Diagnosis Date  . ADD (attention deficit disorder)   . Allergy    allergic rhinitis  . Anemia    nos  . Anxiety   . Arthritis    right ankle  . Asthma    as a child  . Back pain   . Constipation   . Depression   . Elevated blood pressure reading without diagnosis of hypertension   . Genital  herpes   . Lactose intolerance   . Migraine   . Prediabetes   . Sleep apnea   . Stress   . Swelling      Family History  Problem Relation Age of Onset  . Hypertension Mother   . Diabetes Mother   . Hyperlipidemia Mother   . Hypertension Father   . Sleep apnea Father   . Alcoholism Father   . Obesity Father   . Hypertension Other   . Diabetes Maternal Aunt   . Diabetes Maternal Uncle   . Heart failure Paternal Aunt   . Diabetes Paternal Aunt   . Heart failure Maternal Grandmother   . Heart failure Maternal Grandfather   . Breast cancer Sister      Current Outpatient Medications:  .  cycloSPORINE (RESTASIS) 0.05 % ophthalmic emulsion, Place 1 drop into both eyes 2 (two) times daily., Disp: , Rfl:  .  lisdexamfetamine (VYVANSE) 40 MG capsule, Take 1 capsule (40 mg total) by mouth daily., Disp: 30 capsule, Rfl: 0 .  Magnesium Glycinate POWD, 400 mg by Does not apply route daily. (Patient taking differently: Take 400 mg by mouth at bedtime.), Disp: 100 g, Rfl: 0 .  Multiple Vitamin (MULTIVITAMIN) tablet, Take 1 tablet by mouth daily., Disp: , Rfl:  .  nebivolol (BYSTOLIC) 5 MG tablet, TAKE 1 TABLET BY MOUTH DAILY, Disp: 90 tablet, Rfl: 1 .  sertraline (ZOLOFT) 100 MG tablet, Take 1  tablet (100 mg total) by mouth daily., Disp: 30 tablet, Rfl: 1 .  valACYclovir (VALTREX) 500 MG tablet, Take 1 tablet (500 mg total) by mouth as needed (genital herpes)., Disp: 90 tablet, Rfl: 0 .  valsartan (DIOVAN) 160 MG tablet, Take 1 tablet (160 mg total) by mouth daily., Disp: 30 tablet, Rfl: 11 .  Vitamin D, Cholecalciferol, 25 MCG (1000 UT) TABS, Take 5,000 Units by mouth daily. , Disp: , Rfl:  .  hydrochlorothiazide (HYDRODIURIL) 25 MG tablet, Take 1 tablet (25 mg total) by mouth daily., Disp: 90 tablet, Rfl: 1 .  Semaglutide-Weight Management 2.4 MG/0.75ML SOAJ, INJECT 2.4 MG INTO THE SKIN ONCE A WEEK., Disp: 3 mL, Rfl: 11   No Known Allergies   Review of Systems  Constitutional: Negative.    HENT: Positive for tinnitus.        She has persistent ringing of both ears.   Eyes: Negative for blurred vision.  Respiratory: Negative.  Negative for shortness of breath.   Cardiovascular: Negative.  Negative for chest pain and palpitations.  Gastrointestinal: Negative.   Neurological: Negative.   Psychiatric/Behavioral: Negative.      Today's Vitals   09/28/20 1114  BP: (!) 136/94  Pulse: 72  Temp: 98.1 F (36.7 C)  TempSrc: Oral  Weight: 247 lb 9.6 oz (112.3 kg)  Height: 5' 1.8" (1.57 m)   Body mass index is 45.58 kg/m.  Wt Readings from Last 3 Encounters:  09/28/20 247 lb 9.6 oz (112.3 kg)  09/09/20 243 lb 6.4 oz (110.4 kg)  08/04/20 244 lb (110.7 kg)   Objective:  Physical Exam Vitals and nursing note reviewed.  Constitutional:      Appearance: Normal appearance. She is obese.  HENT:     Head: Normocephalic and atraumatic.     Right Ear: Tympanic membrane, ear canal and external ear normal.     Left Ear: Tympanic membrane, ear canal and external ear normal.     Nose:     Comments: Masked     Mouth/Throat:     Comments: Masked  Cardiovascular:     Rate and Rhythm: Normal rate and regular rhythm.     Heart sounds: Normal heart sounds.  Pulmonary:     Effort: Pulmonary effort is normal.     Breath sounds: Normal breath sounds.  Musculoskeletal:     Cervical back: Normal range of motion.  Skin:    General: Skin is warm.  Neurological:     General: No focal deficit present.     Mental Status: She is alert.  Psychiatric:        Mood and Affect: Mood normal.        Behavior: Behavior normal.         Assessment And Plan:     1. Essential hypertension Comments: Due to persistent elevation, I will add valsartan 132m daily in AM. She will likely be able to cut back to 12.563mhctz. She will c/w Bystolic in evenings. Importance of salt restriction was discussed with the patient. I will recheck renal function today. She will f/u in four to six weeks. I will  recheck renal function at that time. Encouraged to stay well hydrated and she is encouraged to let me know if she develops lightheadedness, dizziness, or any other side effects from the medication.  - CMP14+EGFR  2. Tinnitus of both ears Comments:  Persistent.  I will refer her to ENT as requested.  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 She will let me know when refill is needed. Review of the [place] CSRS was performed in accordance of the [place] prior to dispensing any controlled drugs.   4. Other abnormal glucose Comments: Her a1c has been elevated in the past. I will recheck this today. I suspect this has improved with Wegovy use.  - Hemoglobin A1c  5. Moderate episode of recurrent major depressive disorder ([place]) Comments: Chronic, she does not always feel the medication is effective. She agrees to [employer] testing. We will discuss her results at her next visit. She will c/w sertraline for now.   6. Class 3 severe obesity due to excess calories with serious comorbidity and body mass index (BMI) of 45.0 to 49.9 in adult [facility]) Comments: BMI 45.  She has gained 4 pounds since her last visit. Encouraged to incorporate more exercise into her daily routine, aiming for 150 min/week.    Patient was given opportunity to ask questions. Patient verbalized understanding of the plan and was able to repeat key elements of the plan. All questions were answered to their satisfaction.   I, Maximino Greenland, MD, have reviewed all documentation for this visit. The documentation on 09/28/20 for the exam, diagnosis, procedures, and orders are all accurate and complete.  IF YOU HAVE BEEN REFERRED TO A SPECIALIST, IT MAY TAKE 1-2 WEEKS TO SCHEDULE/PROCESS THE REFERRAL. IF YOU HAVE NOT HEARD FROM US/SPECIALIST IN TWO WEEKS, PLEASE GIVE [place] A CALL AT [phone].   THE PATIENT IS ENCOURAGED TO PRACTICE SOCIAL DISTANCING DUE  TO THE COVID-19 PANDEMIC.

## 2020-09-28 NOTE — Patient Instructions (Signed)

## 2020-09-29 LAB — CMP14+EGFR
ALT: 17 IU/L (ref 0–32)
AST: 20 IU/L (ref 0–40)
Albumin/Globulin Ratio: 1.3 (ref 1.2–2.2)
Albumin: 3.9 g/dL (ref 3.8–4.8)
Alkaline Phosphatase: 106 IU/L (ref 44–121)
BUN/Creatinine Ratio: 21 (ref 9–23)
BUN: 14 mg/dL (ref 6–24)
Bilirubin Total: <0.2 mg/dL (ref 0.0–1.2)
CO2: 24 mmol/L (ref 20–29)
Calcium: 9.1 mg/dL (ref 8.7–10.2)
Chloride: 103 mmol/L (ref 96–106)
Creatinine, Ser: 0.68 mg/dL (ref 0.57–1.00)
Globulin, Total: 3.1 g/dL (ref 1.5–4.5)
Glucose: 82 mg/dL (ref 65–99)
Potassium: 4.4 mmol/L (ref 3.5–5.2)
Sodium: 142 mmol/L (ref 134–144)
Total Protein: 7 g/dL (ref 6.0–8.5)
eGFR: 106 mL/min/{1.73_m2} (ref >59)

## 2020-09-29 LAB — HEMOGLOBIN A1C
Est. average glucose Bld gHb Est-mCnc: 114 mg/dL
Hgb A1c MFr Bld: 5.6 % (ref 4.8–5.6)

## 2020-10-01 ENCOUNTER — Encounter: Payer: Self-pay | Admitting: Internal Medicine

## 2020-10-08 ENCOUNTER — Encounter: Payer: Self-pay | Admitting: Internal Medicine

## 2020-10-09 ENCOUNTER — Other Ambulatory Visit: Payer: Self-pay | Admitting: Internal Medicine

## 2020-10-09 DIAGNOSIS — F331 Major depressive disorder, recurrent, moderate: Secondary | ICD-10-CM

## 2020-10-11 ENCOUNTER — Other Ambulatory Visit: Payer: Self-pay

## 2020-10-11 DIAGNOSIS — F331 Major depressive disorder, recurrent, moderate: Secondary | ICD-10-CM

## 2020-10-11 MED ORDER — SERTRALINE HCL 100 MG PO TABS: 100.0000 mg | ORAL_TABLET | Freq: Every day | ORAL | 1 refills | Status: DC

## 2020-10-12 ENCOUNTER — Other Ambulatory Visit: Payer: Self-pay

## 2020-10-12 ENCOUNTER — Ambulatory Visit: Payer: BC Managed Care – PPO

## 2020-10-12 VITALS — BP 134/80 | HR 69 | Temp 98.1°F | Ht 62.0 in | Wt 240.8 lb

## 2020-10-12 DIAGNOSIS — I10 Essential (primary) hypertension: Secondary | ICD-10-CM

## 2020-10-12 NOTE — Progress Notes (Signed)
Pt is here for a BPC. Pt is currently taking valsartan 160mg  and bystolic 5mg . Takes medication around 8am every morning. She walks her dogs once every week, wants to get back into the habit of working out. She watches out on her sodium intake. BP Readings from Last 3 Encounters:  10/12/20 134/80  09/28/20 (!) 136/94  09/09/20 130/72  provider advised pt to watch sodium intake, and to walk 4-5 days a week.

## 2020-10-13 ENCOUNTER — Encounter: Payer: Self-pay | Admitting: Internal Medicine

## 2020-10-14 ENCOUNTER — Other Ambulatory Visit: Payer: Self-pay | Admitting: Internal Medicine

## 2020-10-14 DIAGNOSIS — F988 Other specified behavioral and emotional disorders with onset usually occurring in childhood and adolescence: Secondary | ICD-10-CM

## 2020-10-14 MED ORDER — LISDEXAMFETAMINE DIMESYLATE 40 MG PO CAPS: 40.0000 mg | ORAL_CAPSULE | Freq: Every day | ORAL | 0 refills | Status: DC

## 2020-10-31 ENCOUNTER — Encounter: Payer: Self-pay | Admitting: Internal Medicine

## 2020-11-01 ENCOUNTER — Encounter: Payer: Self-pay | Admitting: Internal Medicine

## 2020-11-05 ENCOUNTER — Encounter: Payer: Self-pay | Admitting: Internal Medicine

## 2020-11-10 ENCOUNTER — Ambulatory Visit: Payer: BC Managed Care – PPO | Admitting: Internal Medicine

## 2020-11-11 ENCOUNTER — Encounter: Payer: Self-pay | Admitting: Internal Medicine

## 2020-11-11 ENCOUNTER — Ambulatory Visit: Payer: BC Managed Care – PPO | Admitting: Internal Medicine

## 2020-11-11 ENCOUNTER — Other Ambulatory Visit: Payer: Self-pay

## 2020-11-11 VITALS — BP 118/90 | HR 87 | Temp 98.4°F | Ht 62.0 in | Wt 238.4 lb

## 2020-11-11 DIAGNOSIS — Z6841 Body Mass Index (BMI) 40.0 and over, adult: Secondary | ICD-10-CM

## 2020-11-11 DIAGNOSIS — F331 Major depressive disorder, recurrent, moderate: Secondary | ICD-10-CM | POA: Diagnosis not present

## 2020-11-11 DIAGNOSIS — F988 Other specified behavioral and emotional disorders with onset usually occurring in childhood and adolescence: Secondary | ICD-10-CM

## 2020-11-11 DIAGNOSIS — I10 Essential (primary) hypertension: Secondary | ICD-10-CM

## 2020-11-11 DIAGNOSIS — Z8616 Personal history of COVID-19: Secondary | ICD-10-CM

## 2020-11-11 DIAGNOSIS — E66813 Obesity, class 3: Secondary | ICD-10-CM

## 2020-11-11 MED ORDER — LISDEXAMFETAMINE DIMESYLATE 40 MG PO CAPS: 40.0000 mg | ORAL_CAPSULE | Freq: Every day | ORAL | 0 refills | Status: DC

## 2020-11-11 MED ORDER — SERTRALINE HCL 100 MG PO TABS: 100.0000 mg | ORAL_TABLET | Freq: Every day | ORAL | 1 refills | Status: DC

## 2020-11-11 MED ORDER — SEMAGLUTIDE-WEIGHT MANAGEMENT 2.4 MG/0.75ML ~~LOC~~ SOAJ: SUBCUTANEOUS | 1 refills | Status: AC

## 2020-11-11 NOTE — Progress Notes (Signed)
I,[name],acting as a Education administrator for Maximino Greenland, MD.,have documented all relevant documentation on the behalf of Maximino Greenland, MD,as directed by  Maximino Greenland, MD while in the presence of Maximino Greenland, MD.  This visit occurred during the SARS-CoV-2 public health emergency.  Safety protocols were in place, including screening questions prior to the visit, additional usage of staff PPE, and extensive cleaning of exam room while observing appropriate contact time as indicated for disinfecting solutions.  Subjective:     Patient ID: Brooke [name] , female    DOB: 13-Jan-1971 , 50 y.o.   MRN: [id number]   Chief Complaint  Patient presents with   [facility] results    HPI  The patient is here today to discuss her Genesight results.  She is currently taking sertraline for depression and Vyvanse for ADD.     Past Medical History:  Diagnosis Date   ADD (attention deficit disorder)    Allergy    allergic rhinitis   Anemia    nos   Anxiety    Arthritis    right ankle   Asthma    as a child   Back pain    Constipation    Depression    Elevated blood pressure reading without diagnosis of hypertension    Genital herpes    Lactose intolerance    Migraine    Prediabetes    Sleep apnea    Stress    Swelling      Family History  Problem Relation Age of Onset   Hypertension Mother    Diabetes Mother    Hyperlipidemia Mother    Hypertension Father    Sleep apnea Father    Alcoholism Father    Obesity Father    Hypertension Other    Diabetes Maternal Aunt    Diabetes Maternal Uncle    Heart failure Paternal Aunt    Diabetes Paternal Aunt    Heart failure Maternal Grandmother    Heart failure Maternal Grandfather    Breast cancer Sister      Current Outpatient Medications:    cycloSPORINE (RESTASIS) 0.05 % ophthalmic emulsion, Place 1 drop into both eyes 2 (two) times daily., Disp: , Rfl:    hydrochlorothiazide (HYDRODIURIL) 25 MG tablet, Take 1  tablet (25 mg total) by mouth daily., Disp: 90 tablet, Rfl: 1   Magnesium Glycinate POWD, 400 mg by Does not apply route daily. (Patient taking differently: Take 400 mg by mouth at bedtime.), Disp: 100 g, Rfl: 0   Multiple Vitamin (MULTIVITAMIN) tablet, Take 1 tablet by mouth daily., Disp: , Rfl:    nebivolol (BYSTOLIC) 5 MG tablet, TAKE 1 TABLET BY MOUTH DAILY, Disp: 90 tablet, Rfl: 1   Semaglutide-Weight Management 2.4 MG/0.75ML SOAJ, INJECT 2.4 MG INTO THE SKIN ONCE A WEEK., Disp: 3 mL, Rfl: 1   sertraline (ZOLOFT) 100 MG tablet, Take 1 tablet (100 mg total) by mouth daily., Disp: 90 tablet, Rfl: 1   valACYclovir (VALTREX) 500 MG tablet, Take 1 tablet (500 mg total) by mouth as needed (genital herpes)., Disp: 90 tablet, Rfl: 0   valsartan (DIOVAN) 160 MG tablet, Take 1 tablet (160 mg total) by mouth daily., Disp: 30 tablet, Rfl: 11   Vitamin D, Cholecalciferol, 25 MCG (1000 UT) TABS, Take 5,000 Units by mouth daily. , Disp: , Rfl:    lisdexamfetamine (VYVANSE) 40 MG capsule, Take 1 capsule (40 mg total) by mouth daily., Disp: 30 capsule, Rfl: 0   No Known Allergies  Review of Systems  Constitutional: Negative.  Negative for chills, fatigue and fever.  HENT:  Negative for ear discharge, hearing loss, sneezing, sore throat and tinnitus.        Fullness feeling in ears.   Eyes:  Negative for pain.  Respiratory: Negative.  Negative for chest tightness, shortness of breath and wheezing.   Cardiovascular: Negative.  Negative for chest pain and palpitations.  Gastrointestinal: Negative.  Negative for constipation and nausea.  Musculoskeletal:  Negative for arthralgias.  Neurological: Negative.   Psychiatric/Behavioral: Negative.    All other systems reviewed and are negative.   Today's Vitals   11/11/20 0855  BP: 118/90  Pulse: 87  Temp: 98.4 F (36.9 C)  TempSrc: Oral  Weight: 238 lb 6.4 oz (108.1 kg)  Height: 5\' 2"  (1.575 m)   Body mass index is 43.6 kg/m.  Wt Readings from Last  3 Encounters:  11/11/20 238 lb 6.4 oz (108.1 kg)  10/12/20 240 lb 12.8 oz (109.2 kg)  09/28/20 247 lb 9.6 oz (112.3 kg)   BP Readings from Last 3 Encounters:  11/11/20 118/90  10/12/20 134/80  09/28/20 (!) 136/94   Objective:  Physical Exam Vitals and nursing note reviewed.  Constitutional:      Appearance: Normal appearance.  HENT:     Head: Normocephalic and atraumatic.     Nose:     Comments: Masked     Mouth/Throat:     Comments: Masked  Cardiovascular:     Rate and Rhythm: Normal rate and regular rhythm.     Heart sounds: Normal heart sounds.  Pulmonary:     Effort: Pulmonary effort is normal.     Breath sounds: Normal breath sounds.  Skin:    General: Skin is warm.  Neurological:     General: No focal deficit present.     Mental Status: She is alert.  Psychiatric:        Mood and Affect: Mood normal.        Behavior: Behavior normal.        Assessment And Plan:     1. Moderate episode of recurrent major depressive disorder ([place]) Comments: Chronic, we went over her Genesight results in full detail. No changes need to be made. Full Genesight results will be scanned into her chart.   2. ADD (attention deficit disorder) without hyperactivity Comments: Chronic, Genesight results do not indicate reason to change meds. She will c/w Vyvanse.  - lisdexamfetamine (VYVANSE) 40 MG capsule; Take 1 capsule (40 mg total) by mouth daily.  Dispense: 30 capsule; Refill: 0  3. Essential hypertension Comments: Chronic, fair control. I will not make any changes to her meds. Encouraged to follow low sodium diet and to aim for at least 150 min of exercise per week.   4. Class 3 severe obesity due to excess calories with serious comorbidity and body mass index (BMI) of 40.0 to 44.9 in adult [facility]) Comments: She is on Wegovy, she will c/w current meds.   5. Personal history of COVID-19 Comments: She is fully vaccinated, not yet boosted. Encouraged to get 1st booster within next few  weeks.   Patient was given opportunity to ask questions. Patient verbalized understanding of the plan and was able to repeat key elements of the plan. All questions were answered to their satisfaction.   I, Maximino Greenland, MD, have reviewed all documentation for this visit. The documentation on 11/11/20 for the exam, diagnosis, procedures, and orders are all accurate and complete.  IF YOU HAVE BEEN REFERRED TO A SPECIALIST, IT MAY TAKE 1-2 WEEKS TO SCHEDULE/PROCESS THE REFERRAL. IF YOU HAVE NOT HEARD FROM US/SPECIALIST IN TWO WEEKS, PLEASE GIVE [place] A CALL AT [phone].   THE PATIENT IS ENCOURAGED TO PRACTICE SOCIAL DISTANCING DUE TO THE COVID-19 PANDEMIC.

## 2020-12-12 DIAGNOSIS — F988 Other specified behavioral and emotional disorders with onset usually occurring in childhood and adolescence: Secondary | ICD-10-CM | POA: Insufficient documentation

## 2020-12-12 DIAGNOSIS — Z8616 Personal history of COVID-19: Secondary | ICD-10-CM | POA: Insufficient documentation

## 2020-12-24 ENCOUNTER — Other Ambulatory Visit: Payer: Self-pay | Admitting: Internal Medicine

## 2020-12-24 DIAGNOSIS — F988 Other specified behavioral and emotional disorders with onset usually occurring in childhood and adolescence: Secondary | ICD-10-CM

## 2020-12-25 MED ORDER — LISDEXAMFETAMINE DIMESYLATE 40 MG PO CAPS: 40.0000 mg | ORAL_CAPSULE | Freq: Every day | ORAL | 0 refills | Status: DC

## 2020-12-30 ENCOUNTER — Ambulatory Visit: Payer: BC Managed Care – PPO | Admitting: Internal Medicine

## 2020-12-30 ENCOUNTER — Other Ambulatory Visit: Payer: Self-pay

## 2020-12-30 ENCOUNTER — Encounter: Payer: Self-pay | Admitting: Internal Medicine

## 2020-12-30 VITALS — BP 132/90 | HR 76 | Temp 98.4°F | Ht 62.0 in | Wt 247.0 lb

## 2020-12-30 DIAGNOSIS — I10 Essential (primary) hypertension: Secondary | ICD-10-CM

## 2020-12-30 DIAGNOSIS — G4733 Obstructive sleep apnea (adult) (pediatric): Secondary | ICD-10-CM

## 2020-12-30 DIAGNOSIS — R413 Other amnesia: Secondary | ICD-10-CM | POA: Diagnosis not present

## 2020-12-30 DIAGNOSIS — Z6841 Body Mass Index (BMI) 40.0 and over, adult: Secondary | ICD-10-CM

## 2020-12-30 DIAGNOSIS — F988 Other specified behavioral and emotional disorders with onset usually occurring in childhood and adolescence: Secondary | ICD-10-CM

## 2020-12-30 DIAGNOSIS — Z23 Encounter for immunization: Secondary | ICD-10-CM

## 2020-12-30 MED ORDER — SHINGRIX 50 MCG/0.5ML IM SUSR: 0.5000 mL | Freq: Once | INTRAMUSCULAR | 0 refills | Status: AC

## 2020-12-30 NOTE — Progress Notes (Signed)
I,Brooke [name],acting as a Education administrator for Brooke Greenland, MD.,have documented all relevant documentation on the behalf of Brooke Greenland, MD,as directed by  Brooke Greenland, MD while in the presence of Brooke Greenland, MD.  This visit occurred during the SARS-CoV-2 public health emergency.  Safety protocols were in place, including screening questions prior to the visit, additional usage of staff PPE, and extensive cleaning of exam room while observing appropriate contact time as indicated for disinfecting solutions.  Subjective:     Patient ID: Brooke [name] , female    DOB: [birth year] , 50 y.o.   MRN: [id number]   Chief Complaint  Patient presents with   ADHD    HPI  The patient is here today for a follow-up on ADHD.  She reports compliance with meds. She has not had any issues with the medication.   Hypertension This is a chronic problem. The current episode started more than 1 year ago. The problem has been gradually improving since onset. The problem is controlled. Pertinent negatives include no blurred vision, chest pain, palpitations or shortness of breath. Risk factors for coronary artery disease include obesity. Past treatments include diuretics. The current treatment provides mild improvement. Compliance problems include exercise.     Past Medical History:  Diagnosis Date   ADD (attention deficit disorder)    Allergy    allergic rhinitis   Anemia    nos   Anxiety    Arthritis    right ankle   Asthma    as a child   Back pain    Constipation    Depression    Elevated blood pressure reading without diagnosis of hypertension    Genital herpes    Lactose intolerance    Migraine    Prediabetes    Sleep apnea    Stress    Swelling      Family History  Problem Relation Age of Onset   Hypertension Mother    Diabetes Mother    Hyperlipidemia Mother    Hypertension Father    Sleep apnea Father    Alcoholism Father    Obesity Father    Hypertension Other     Diabetes Maternal Aunt    Diabetes Maternal Uncle    Heart failure Paternal Aunt    Diabetes Paternal Aunt    Heart failure Maternal Grandmother    Heart failure Maternal Grandfather    Breast cancer Sister      Current Outpatient Medications:    cycloSPORINE (RESTASIS) 0.05 % ophthalmic emulsion, Place 1 drop into both eyes 2 (two) times daily., Disp: , Rfl:    hydrochlorothiazide (HYDRODIURIL) 25 MG tablet, Take 1 tablet (25 mg total) by mouth daily., Disp: 90 tablet, Rfl: 1   lisdexamfetamine (VYVANSE) 40 MG capsule, Take 1 capsule (40 mg total) by mouth daily., Disp: 30 capsule, Rfl: 0   Magnesium Glycinate POWD, 400 mg by Does not apply route daily. (Patient taking differently: Take 400 mg by mouth at bedtime.), Disp: 100 g, Rfl: 0   Multiple Vitamin (MULTIVITAMIN) tablet, Take 1 tablet by mouth daily., Disp: , Rfl:    nebivolol (BYSTOLIC) 5 MG tablet, TAKE 1 TABLET BY MOUTH DAILY, Disp: 90 tablet, Rfl: 1   Semaglutide-Weight Management 2.4 MG/0.75ML SOAJ, INJECT 2.4 MG INTO THE SKIN ONCE A WEEK., Disp: 3 mL, Rfl: 1   sertraline (ZOLOFT) 100 MG tablet, Take 1 tablet (100 mg total) by mouth daily., Disp: 90 tablet, Rfl: 1   valACYclovir (VALTREX) 500  MG tablet, Take 1 tablet (500 mg total) by mouth as needed (genital herpes)., Disp: 90 tablet, Rfl: 0   valsartan (DIOVAN) 160 MG tablet, Take 1 tablet (160 mg total) by mouth daily., Disp: 30 tablet, Rfl: 11   Vitamin D, Cholecalciferol, 25 MCG (1000 UT) TABS, Take 5,000 Units by mouth daily. , Disp: , Rfl:    No Known Allergies   Review of Systems  Constitutional: Negative.   Eyes:  Negative for blurred vision.  Respiratory: Negative.  Negative for shortness of breath.   Cardiovascular: Negative.  Negative for chest pain and palpitations.  Gastrointestinal: Negative.   Neurological: Negative.   Psychiatric/Behavioral: Negative.      Today's Vitals   12/30/20 1428  BP: 132/90  Pulse: 76  Temp: 98.4 F (36.9 C)  TempSrc:  Oral  Weight: 247 lb (112 kg)  Height: 5' 2"  (1.575 m)   Body mass index is 45.18 kg/m.  Wt Readings from Last 3 Encounters:  12/30/20 247 lb (112 kg)  11/11/20 238 lb 6.4 oz (108.1 kg)  10/12/20 240 lb 12.8 oz (109.2 kg)    BP Readings from Last 3 Encounters:  12/30/20 132/90  11/11/20 118/90  10/12/20 134/80    Objective:  Physical Exam Vitals and nursing note reviewed.  Constitutional:      Appearance: Normal appearance.  HENT:     Head: Normocephalic and atraumatic.     Nose:     Comments: Masked     Mouth/Throat:     Comments: Masked  Cardiovascular:     Rate and Rhythm: Normal rate and regular rhythm.     Heart sounds: Normal heart sounds.  Pulmonary:     Effort: Pulmonary effort is normal.     Breath sounds: Normal breath sounds.  Musculoskeletal:     Cervical back: Normal range of motion.  Skin:    General: Skin is warm.  Neurological:     General: No focal deficit present.     Mental Status: She is alert.  Psychiatric:        Mood and Affect: Mood normal.        Behavior: Behavior normal.        Assessment And Plan:     1. ADD (attention deficit disorder) without hyperactivity Comments: Chronic, stable. She will c/w Vyvanse. PDMP reviewed.  She will f/u in 3 months for re-evaluation.   2. Essential hypertension Comments: Fair control. I will not make any changes today. Encouraged to incorporate more exercise into her daily routine.  - BMP8+EGFR  3. Memory changes Comments: I will check labs as listed below. I will make further recommendations once her labs are available for review. I also stressed importance of CPAP compliance.  - TSH - Vitamin B12 - RPR - Ambulatory referral to Neurology  4. OSA (obstructive sleep apnea) Comments: I will send referral to Neuro for f/u. Unfortunately, she admits she has not been using her CPAP.  - Ambulatory referral to Neurology  5. Class 3 severe obesity due to excess calories with serious comorbidity and  body mass index (BMI) of 45.0 to 49.9 in adult [facility]) Comments: BMI 45. She is currently on Wegovy, followed at [facility] clinic. Encouraged to incorporate more exercise into her daily routine.   6. Immunization due Comments: I will send rx Shingrix to her local pharmacy.   Patient was given opportunity to ask questions. Patient verbalized understanding of the plan and was able to repeat key elements of the plan. All questions  were answered to their satisfaction.   I, Brooke Greenland, MD, have reviewed. all documentation for this visit. The documentation on 01/01/21 for the exam, diagnosis, procedures, and orders are all accurate and complete   IF YOU HAVE BEEN REFERRED TO A SPECIALIST, IT MAY TAKE 1-2 WEEKS TO SCHEDULE/PROCESS THE REFERRAL. IF YOU HAVE NOT HEARD FROM US/SPECIALIST IN TWO WEEKS, PLEASE GIVE [place] A CALL AT [phone].   THE PATIENT IS ENCOURAGED TO PRACTICE SOCIAL DISTANCING DUE TO THE COVID-19 PANDEMIC.

## 2020-12-31 LAB — BMP8+EGFR
BUN/Creatinine Ratio: 13 (ref 9–23)
BUN: 10 mg/dL (ref 6–24)
CO2: 25 mmol/L (ref 20–29)
Calcium: 9.3 mg/dL (ref 8.7–10.2)
Chloride: 99 mmol/L (ref 96–106)
Creatinine, Ser: 0.8 mg/dL (ref 0.57–1.00)
Glucose: 77 mg/dL (ref 65–99)
Potassium: 4.6 mmol/L (ref 3.5–5.2)
Sodium: 138 mmol/L (ref 134–144)
eGFR: 90 mL/min/{1.73_m2} (ref >59)

## 2020-12-31 LAB — TSH: TSH: 1.57 u[IU]/mL (ref 0.450–4.500)

## 2020-12-31 LAB — RPR: RPR Ser Ql: NONREACTIVE

## 2020-12-31 LAB — VITAMIN B12: Vitamin B-12: 365 pg/mL (ref 232–1245)

## 2021-02-23 ENCOUNTER — Other Ambulatory Visit: Payer: Self-pay | Admitting: Internal Medicine

## 2021-02-23 DIAGNOSIS — I10 Essential (primary) hypertension: Secondary | ICD-10-CM

## 2021-02-24 ENCOUNTER — Other Ambulatory Visit: Payer: Self-pay | Admitting: Internal Medicine

## 2021-02-24 DIAGNOSIS — F988 Other specified behavioral and emotional disorders with onset usually occurring in childhood and adolescence: Secondary | ICD-10-CM

## 2021-02-24 MED ORDER — LISDEXAMFETAMINE DIMESYLATE 40 MG PO CAPS
40.0000 mg | ORAL_CAPSULE | Freq: Every day | ORAL | 0 refills | Status: DC
Start: 2021-02-24 — End: 2021-04-08

## 2021-03-17 ENCOUNTER — Ambulatory Visit: Payer: BC Managed Care – PPO | Admitting: Neurology

## 2021-03-17 ENCOUNTER — Encounter: Payer: Self-pay | Admitting: Neurology

## 2021-03-17 ENCOUNTER — Other Ambulatory Visit: Payer: Self-pay

## 2021-03-17 VITALS — BP 134/88 | HR 77 | Ht 62.0 in | Wt 254.6 lb

## 2021-03-17 DIAGNOSIS — Z6841 Body Mass Index (BMI) 40.0 and over, adult: Secondary | ICD-10-CM

## 2021-03-17 DIAGNOSIS — R413 Other amnesia: Secondary | ICD-10-CM | POA: Diagnosis not present

## 2021-03-17 DIAGNOSIS — G4733 Obstructive sleep apnea (adult) (pediatric): Secondary | ICD-10-CM

## 2021-03-17 DIAGNOSIS — R4789 Other speech disturbances: Secondary | ICD-10-CM | POA: Diagnosis not present

## 2021-03-17 NOTE — Progress Notes (Signed)
Secure message sent to [employer] regarding orders placed for mask refit and CPAP supplies.

## 2021-03-17 NOTE — Progress Notes (Signed)
Subjective:    Patient ID: Brooke [name] is a 50 y.o. female.  HPI    [name], MD, PhD [facility] [address] [place], [place] [address]  Dear Dr. Baird Cancer,  I saw your patient, Brooke [name], upon your kind request in my neurologic clinic today for initial consultation of her memory loss. The patient is unaccompanied today.  As you know, Brooke [name] is a 50 year old right-handed woman with an underlying medical history of ADD, anemia, anxiety, arthritis, asthma, prediabetes, allergies, depression, migraine headaches, hypertension, and severe obesity with a BMI of over 83, who reports an approximately 1 year history of difficulty with her short-term memory, difficulty focusing and multitasking and word finding difficulty.  She does not have a family history of dementia.  She has not used her CPAP in about a year.  She had trouble using the nasal pillows she admits.  She also loaned her machine to her brother-in-law who needed to be on treatment.  She is willing to get back on her CPAP machine.  She would like to use a different interface.  I reviewed your office note from 12/30/2020.  She reported that she was not using her CPAP at the time.  She had lab work through your office including TSH, vitamin B12, and RPR, I reviewed test results in her chart.  RPR was nonreactive, TSH in the normal range, vitamin B12 was on the lower end of the spectrum at 365.  She was advised to proceed with a B12 injection. She reports that she received 3 injections. I had previously evaluated her for sleep apnea.  She has been lost to follow-up since August 2019. She was diagnosed with moderate to severe obstructive sleep apnea with a baseline sleep study on 09/12/2017.  She had a subsequent titration study.   She works as a Development worker, international aid principal.  She lives alone, has 2 dogs in the household.  She is a restless sleeper.  Bedtime is generally between  8 and 9 and rise time between 530 and 6:30 AM.  She has not had a brain scan.  She feels that her left nostril is often congested.  She does have occasional nasal drainage.  She has been on sertraline for about 5 years.  She is now followed by [facility] weight management and has lost weight.  She drinks alcohol rarely, she is a non-smoker.  She drinks caffeine in the form of coffee, 1 cup in the mornings typically.  Previously:  01/30/2018: 50 year old right-handed woman with an underlying medical history of migraine headaches, arthritis, anxiety, anemia, allergies, history of asthma, and morbid obesity with a BMI of over 50, who presents for follow-up consultation of her obstructive sleep apnea, after sleep study testing and starting CPAP therapy at home. The patient is unaccompanied today. She missed an appointment on 01/29/2018. I first met her on 08/14/2017 at the request of her primary care nurse practitioner, at which time the patient reported snoring and daytime somnolence. She was advised to proceed with sleep study testing. She has undergone a baseline sleep study followed by a CPAP titration study. I went over her test results with her in detail today. Baseline sleep study from 09/12/2017 showed a sleep latency of 23.5 minutes, REM latency delayed at 266 minutes, sleep efficiency of 85.4%. She had a fairly well preserved sleep architecture. Overall AHI was in the moderate range at 15.5 per hour, REM AHI was in the severe range at  38 per hour, supine AHI at 13.8 per hour. She had no significant PLMS, average oxygen saturation was 96%, nadir was 71%. Based on her medical history and test results she was advised to proceed with a full night CPAP titration study. She had this on 10/28/2017. Sleep latency was 28.5 minutes, REM latency 167 minutes, sleep efficiency 80.4%. She had an increased percentage of slow-wave sleep and REM sleep was decreased at 13.8%. She was fitted with nasal pillows and titrated on  CPAP of 5 cm to 7 cm. Her AHI on the final setting was 0 per hour with supine non-REM sleep achieved an O2 nadir of 92%. Based on her test results I suggested we try a home CPAP pressure of 8 cm via nasal pillows.   I reviewed her CPAP compliance data from 11/27/2017 through 12/27/2017 which is a total of 31 days, during which time she used her CPAP 25 days with percent used days greater than 4 hours at 77%, indicating adequate compliance with an average usage of 6 hours and 4 minutes, residual AHI at goal at 2.6 per hour, leak on the low side with the 95th percentile at 4.4 L/m on a pressure of 8 cm with EPR of 3. In the past month her compliance has decreased a little bit, she has used her machine 20 out of 30 days. She reports that she missed several days because of recent travel. She uses melatonin at night for sleep. She generally tries to take her machine on her travels. She would say that she has seen some improvement in her headaches but not consistently but she also noticed an increase in her blood pressure values lately. She is motivated to work on weight loss. She does wake up a little better rested, does not feel is sluggish during the day. She is motivated to continue with treatment. She is using nasal pillows successfully, had some nasal irritation in the beginning but that has subsided.     08/14/2017: (She) reports snoring and excessive daytime somnolence. I reviewed your office note from 07/02/2017, which you kindly included. Her Epworth sleepiness score is 15 out of 24, fatigue score is 34 out of 63. She reports not waking up rested, sometimes she feels slowing cognition. She wakes up with muscle stiffness. She is single and lives alone, she works for the [employer] system. She is a nonsmoker and drinks alcohol rarely, caffeine in the form of coffee 1 large cup per day on average.  She has a stressful drop. 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 She used to be principal  for 5 years but it caused her too much stress, at which point she had to take a step back. She was able to lose weight a few years ago in the realm of 50 pounds but slowly gained it back. She has nocturia about once or twice per average night, she has woken up with a headache. Years ago, when she was still principal at her school she tried something for sleep, possibly trazodone. She currently takes melatonin every night. Bedtime is around 11 or 12, rise time around 5:30. She has no pets. She typically sleeps on her sides. She does not have an official family history of sleep apnea but suspects that her father may have sleep apnea. She has had sleep paralysis before, less now than some years ago. She has had rare enuresis.    Her Past Medical History Is Significant For: Past Medical History:  Diagnosis Date  ADD (attention deficit disorder)    Allergy    allergic rhinitis   Anemia    nos   Anxiety    Arthritis    right ankle   Asthma    as a child   Back pain    Constipation    Depression    Elevated blood pressure reading without diagnosis of hypertension    Genital herpes    Lactose intolerance    Migraine    Prediabetes    Sleep apnea    Stress    Swelling     Her Past Surgical History Is Significant For: Past Surgical History:  Procedure Laterality Date   ANKLE SURGERY     COLONOSCOPY WITH PROPOFOL N/A 10/06/2019   Procedure: COLONOSCOPY WITH PROPOFOL;  Surgeon: Ronnette Juniper, MD;  Location: [facility];  Service: Gastroenterology;  Laterality: N/A;   POLYPECTOMY  10/06/2019   Procedure: POLYPECTOMY;  Surgeon: Ronnette Juniper, MD;  Location: [facility];  Service: Gastroenterology;;    Her Family History Is Significant For: Family History  Problem Relation Age of Onset   Hypertension Mother    Diabetes Mother    Hyperlipidemia Mother    Hypertension Father    Sleep apnea Father    Alcoholism Father    Obesity Father    Hypertension Other    Diabetes Maternal Aunt     Diabetes Maternal Uncle    Heart failure Paternal Aunt    Diabetes Paternal Aunt    Heart failure Maternal Grandmother    Heart failure Maternal Grandfather    Breast cancer Sister     Her Social History Is Significant For: Social History   Socioeconomic History   Marital status: Single    Spouse name: Not on file   Number of children: Not on file   Years of education: Not on file   Highest education level: Not on file  Occupational History   Occupation: Asst Principal  Tobacco Use   Smoking status: Never   Smokeless tobacco: Never  Vaping Use   Vaping Use: Never used  Substance and Sexual Activity   Alcohol use: No    Comment: OCC   Drug use: No   Sexual activity: Not on file  Other Topics Concern   Not on file  Social History Narrative   Not on file   Social Determinants of Health   Financial Resource Strain: Not on file  Food Insecurity: Not on file  Transportation Needs: Not on file  Physical Activity: Not on file  Stress: Not on file  Social Connections: Not on file    Her Allergies Are:  No Known Allergies:   Her Current Medications Are:  Outpatient Encounter Medications as of 03/17/2021  Medication Sig   cycloSPORINE (RESTASIS) 0.05 % ophthalmic emulsion Place 1 drop into both eyes 2 (two) times daily.   lisdexamfetamine (VYVANSE) 40 MG capsule Take 1 capsule (40 mg total) by mouth daily.   Magnesium Glycinate POWD 400 mg by Does not apply route daily. (Patient taking differently: Take 400 mg by mouth at bedtime.)   Multiple Vitamin (MULTIVITAMIN) tablet Take 1 tablet by mouth daily.   nebivolol (BYSTOLIC) 5 MG tablet TAKE 1 TABLET BY MOUTH DAILY   Semaglutide-Weight Management 2.4 MG/0.75ML SOAJ INJECT 2.4 MG INTO THE SKIN ONCE A WEEK.   sertraline (ZOLOFT) 100 MG tablet Take 1 tablet (100 mg total) by mouth daily.   valACYclovir (VALTREX) 500 MG tablet Take 1 tablet (500 mg total) by mouth as needed (genital  herpes).   valsartan (DIOVAN) 160 MG tablet  Take 1 tablet (160 mg total) by mouth daily.   Vitamin D, Cholecalciferol, 25 MCG (1000 UT) TABS Take 5,000 Units by mouth daily.    hydrochlorothiazide (HYDRODIURIL) 25 MG tablet Take 1 tablet (25 mg total) by mouth daily.   No facility-administered encounter medications on file as of 03/17/2021.  :   Review of Systems:  Out of a complete 14 point review of systems, all are reviewed and negative with the exception of these symptoms as listed below:   Review of Systems  Neurological:        Pt is here for sleep consult . Pt states she stopped using the CPAP for a year or more .  Pt states she has hypertension, Wakes up with fatigue and headache . Pt states she snores, and gasp for breathe during the night .   FSS:52 ESS:7   Objective:  Neurological Exam  Physical Exam Physical Examination:   Vitals:   03/17/21 0915  BP: 134/88  Pulse: 77    General Examination: The patient is a very pleasant 50 y.o. female in no acute distress. She appears well-developed and well-nourished and well groomed.   HEENT: Normocephalic, atraumatic, pupils are equal, round and reactive to light and accommodation. She wears corrective eyeglasses. Extraocular tracking is good without limitation to gaze excursion or nystagmus noted. Normal smooth pursuit is noted. Hearing is grossly intact. Tympanic membranes are clear bilaterally. Face is symmetric with normal facial animation and normal facial sensation. Speech is clear with no dysarthria noted. There is no hypophonia. There is no lip, neck/head, jaw or voice tremor. Neck is supple with full range of passive and active motion. There are no carotid bruits on auscultation. Oropharynx exam reveals: mild mouth dryness, good dental hygiene and moderate airway crowding. Tongue protrudes centrally and palate elevates symmetrically. Nasal inspection reveals mild nasal mucosal bogginess, mild redness and no septal deviation.    Chest: Clear to auscultation without  wheezing, rhonchi or crackles noted.   Heart: S1+S2+0, regular and normal without murmurs, rubs or gallops noted.    Abdomen: Soft, non-tender and non-distended.   Extremities: There is no pitting edema in the distal lower extremities bilaterally.    Skin: Warm and dry without trophic changes noted.   Musculoskeletal: exam reveals no obvious joint deformities.    Neurologically:  Mental status: The patient is awake, alert and oriented in all 4 spheres. Her immediate and remote memory, attention, language skills and fund of knowledge are appropriate. There is no evidence of aphasia, agnosia, apraxia or anomia. Speech is clear with normal prosody and enunciation. Thought process is linear. Mood is normal and affect is normal.   MMSE - Mini Mental State Exam 03/17/2021  Orientation to time 5  Orientation to Place 5  Registration 3  Attention/ Calculation 5  Recall 3  Language- name 2 objects 2  Language- repeat 1  Language- follow 3 step command 3  Language- read & follow direction 1  Write a sentence 1  Copy design 1  Total score 30    On 03/17/2021: CDT: 4/4, AFT: 15/min.  Cranial nerves II - XII are as described above under HEENT exam. Motor exam: Normal bulk, strength and tone is noted. There is no drift, tremor or rebound. Romberg is negative. Reflexes are 2+ throughout. Fine motor skills and coordination: Grossly intact in the upper and lower extremities.  Cerebellar testing: No dysmetria or intention tremor. There is no truncal or  gait ataxia.  Sensory exam: intact to light touch in the upper and lower extremities.  Gait, station and balance: She stands easily. No veering to one side is noted. No leaning to one side is noted. Posture is age-appropriate and stance is narrow based. Gait shows normal stride length and normal pace. No problems turning are noted. Tandem walk is unremarkable.   Assessment and plan:  In summary, Brooke [name] is a very pleasant 50 year old  female with an underlying medical history of ADD, anemia, anxiety, arthritis, asthma, prediabetes, allergies, depression, migraine headaches, hypertension, and severe obesity with a BMI of over 63, who presents for evaluation of her memory loss including short-term memory issues and word finding difficulty for the past year.  Her history is not alarming for any significant organic memory loss diagnosis such as dementia.  Nevertheless, symptoms may be rooted in different contributing diagnoses including her ADD, history of depression, lower vitamin B12 level, and untreated sleep apnea.  We talked about this.  She may want to schedule another B12 injection.  She is advised to talk to your office about monthly injections at this point.  In addition, we talked about the importance of healthy lifestyle, she is pursuing weight loss actively.  She is willing to get back on her CPAP machine.  We mutually agreed to try her on a different nasal interface as the nasal pillows bothered her.  Her memory scores look good today.  Nevertheless, I would like to proceed with a brain MRI with and without contrast.  She had recent blood work through your office and I did not suggest any new blood work today from my end.  She is advised to follow-up routinely in this clinic in 3 to 4 months.  I wrote for new CPAP related supplies and a mask refit.  I answered all her questions today and the patient was in agreement.  This was an extended visit with high complexity, including record review, counseling and coordination of care, discussion of memory loss, its prognosis and management, and discussion of sleep apnea, its prognosis and management. Thank you very much for allowing me to participate in the care of this nice patient. If I can be of any further assistance to you please do not hesitate to call me at [phone].   Sincerely,     [name], MD, PhD

## 2021-03-30 ENCOUNTER — Ambulatory Visit: Payer: BC Managed Care – PPO

## 2021-03-30 DIAGNOSIS — R4789 Other speech disturbances: Secondary | ICD-10-CM | POA: Diagnosis not present

## 2021-03-30 DIAGNOSIS — R413 Other amnesia: Secondary | ICD-10-CM | POA: Diagnosis not present

## 2021-03-30 DIAGNOSIS — G4733 Obstructive sleep apnea (adult) (pediatric): Secondary | ICD-10-CM

## 2021-03-30 MED ORDER — GADOBENATE DIMEGLUMINE 529 MG/ML IV SOLN
20.0000 mL | Freq: Once | INTRAVENOUS | Status: AC | PRN
  Administered 2021-03-30: 20 mL via INTRAVENOUS

## 2021-04-04 ENCOUNTER — Encounter: Payer: BC Managed Care – PPO | Admitting: Internal Medicine

## 2021-04-04 ENCOUNTER — Encounter: Payer: Self-pay | Admitting: *Deleted

## 2021-04-04 NOTE — Progress Notes (Unsigned)
Error

## 2021-04-08 ENCOUNTER — Other Ambulatory Visit: Payer: Self-pay | Admitting: Internal Medicine

## 2021-04-08 DIAGNOSIS — F988 Other specified behavioral and emotional disorders with onset usually occurring in childhood and adolescence: Secondary | ICD-10-CM

## 2021-04-13 MED ORDER — LISDEXAMFETAMINE DIMESYLATE 40 MG PO CAPS: 40.0000 mg | ORAL_CAPSULE | Freq: Every day | ORAL | 0 refills | Status: DC

## 2021-04-20 ENCOUNTER — Ambulatory Visit: Payer: BC Managed Care – PPO | Admitting: Nurse Practitioner

## 2021-04-20 ENCOUNTER — Other Ambulatory Visit: Payer: Self-pay

## 2021-04-20 VITALS — BP 140/80 | HR 71 | Temp 98.8°F | Ht 63.6 in | Wt 261.8 lb

## 2021-04-20 DIAGNOSIS — Z6841 Body Mass Index (BMI) 40.0 and over, adult: Secondary | ICD-10-CM | POA: Diagnosis not present

## 2021-04-20 DIAGNOSIS — I10 Essential (primary) hypertension: Secondary | ICD-10-CM

## 2021-04-20 MED ORDER — NEBIVOLOL HCL 10 MG PO TABS
10.0000 mg | ORAL_TABLET | Freq: Every day | ORAL | 0 refills | Status: DC
Start: 2021-04-20 — End: 2021-08-22

## 2021-04-20 NOTE — Patient Instructions (Signed)

## 2021-04-20 NOTE — Progress Notes (Signed)
I,Tianna Badgett,acting as a Education administrator for [employer], NP.,have documented all relevant documentation on the behalf of [employer], NP,as directed by  Bary Castilla, NP while in the presence of Bary Castilla, NP.  This visit occurred during the SARS-CoV-2 public health emergency.  Safety protocols were in place, including screening questions prior to the visit, additional usage of staff PPE, and extensive cleaning of exam room while observing appropriate contact time as indicated for disinfecting solutions.  Subjective:     Patient ID: Brooke [name] , female    DOB: 02/16/1971 , 50 y.o.   MRN: [id number]   Chief Complaint  Patient presents with  . Hypertension    HPI  Patient is here for elevated blood pressure readings and blurred vision. She reports that her blood pressure was 158/97 yesterday. Her eyes feel tight, light headache. She does have a physical next week. Will blood work next week.   Hypertension Associated symptoms include headaches. Pertinent negatives include no chest pain, palpitations or shortness of breath.    Past Medical History:  Diagnosis Date  . ADD (attention deficit disorder)   . Allergy    allergic rhinitis  . Anemia    nos  . Anxiety   . Arthritis    right ankle  . Asthma    as a child  . Back pain   . Constipation   . Depression   . Elevated blood pressure reading without diagnosis of hypertension   . Genital herpes   . Lactose intolerance   . Migraine   . Prediabetes   . Sleep apnea   . Stress   . Swelling      Family History  Problem Relation Age of Onset  . Hypertension Mother   . Diabetes Mother   . Hyperlipidemia Mother   . Hypertension Father   . Sleep apnea Father   . Alcoholism Father   . Obesity Father   . Hypertension Other   . Diabetes Maternal Aunt   . Diabetes Maternal Uncle   . Heart failure Paternal Aunt   . Diabetes Paternal Aunt   . Heart failure Maternal Grandmother   . Heart failure  Maternal Grandfather   . Breast cancer Sister      Current Outpatient Medications:  .  nebivolol (BYSTOLIC) 10 MG tablet, Take 1 tablet (10 mg total) by mouth daily., Disp: 30 tablet, Rfl: 0 .  cycloSPORINE (RESTASIS) 0.05 % ophthalmic emulsion, Place 1 drop into both eyes 2 (two) times daily., Disp: , Rfl:  .  hydrochlorothiazide (HYDRODIURIL) 25 MG tablet, Take 1 tablet (25 mg total) by mouth daily., Disp: 90 tablet, Rfl: 1 .  lisdexamfetamine (VYVANSE) 40 MG capsule, Take 1 capsule (40 mg total) by mouth daily., Disp: 30 capsule, Rfl: 0 .  Magnesium Glycinate POWD, 400 mg by Does not apply route daily. (Patient taking differently: Take 400 mg by mouth at bedtime.), Disp: 100 g, Rfl: 0 .  Multiple Vitamin (MULTIVITAMIN) tablet, Take 1 tablet by mouth daily., Disp: , Rfl:  .  Semaglutide-Weight Management 2.4 MG/0.75ML SOAJ, INJECT 2.4 MG INTO THE SKIN ONCE A WEEK., Disp: 3 mL, Rfl: 1 .  sertraline (ZOLOFT) 100 MG tablet, Take 1 tablet (100 mg total) by mouth daily., Disp: 90 tablet, Rfl: 1 .  valACYclovir (VALTREX) 500 MG tablet, Take 1 tablet (500 mg total) by mouth as needed (genital herpes)., Disp: 90 tablet, Rfl: 0 .  valsartan (DIOVAN) 160 MG tablet, Take 1 tablet (160 mg total) by mouth daily.,  Disp: 30 tablet, Rfl: 11 .  Vitamin D, Cholecalciferol, 25 MCG (1000 UT) TABS, Take 5,000 Units by mouth daily. , Disp: , Rfl:    No Known Allergies   Review of Systems  Constitutional: Negative.   Eyes:  Positive for visual disturbance.  Respiratory: Negative.  Negative for shortness of breath and wheezing.   Cardiovascular:  Negative for chest pain and palpitations.  Gastrointestinal: Negative.  Negative for constipation and diarrhea.  Musculoskeletal:  Negative for arthralgias and myalgias.  Neurological:  Positive for headaches.    Today's Vitals   04/20/21 1418  BP: 140/80  Pulse: 71  Temp: 98.8 F (37.1 C)  TempSrc: Oral  Weight: 261 lb 12.8 oz (118.8 kg)  Height: 5' 3.6"  (1.615 m)   Body mass index is 45.5 kg/m.  Wt Readings from Last 3 Encounters:  04/20/21 261 lb 12.8 oz (118.8 kg)  03/17/21 254 lb 9.6 oz (115.5 kg)  12/30/20 247 lb (112 kg)    Objective:  Physical Exam Constitutional:      Appearance: Normal appearance.  HENT:     Head: Normocephalic and atraumatic.  Cardiovascular:     Rate and Rhythm: Normal rate and regular rhythm.     Pulses: Normal pulses.     Heart sounds: Normal heart sounds. No murmur heard. Pulmonary:     Effort: Pulmonary effort is normal. No respiratory distress.     Breath sounds: Normal breath sounds. No wheezing.  Skin:    General: Skin is warm and dry.     Capillary Refill: Capillary refill takes less than 2 seconds.  Neurological:     Mental Status: She is alert.        Assessment And Plan:     1. Essential hypertension -pt. at home BP was 140-150/80-90  -Denies chest pain or SOB  -Will increase Bystolic 5 mg to 10 mg  -pt comes in next week for physical and will check BP again.  -Limit the intake of processed foods and salt intake. You should increase your intake of green vegetables and fruits. Limit the use of alcohol. Limit fast foods and fried foods. Avoid high fatty saturated and trans fat foods. Keep yourself hydrated with drinking water. Avoid red meats. Eat lean meats instead. Exercise for atleast 30-45 min for atleast 4-5 times a week.  - nebivolol (BYSTOLIC) 10 MG tablet; Take 1 tablet (10 mg total) by mouth daily.  Dispense: 30 tablet; Refill: 0  2. Class 3 severe obesity due to excess calories with serious comorbidity and body mass index (BMI) of 45.0 to 49.9 in adult [facility]) -Advised patient on a healthy diet including avoiding fast food and red meats. Increase the intake of lean meats including grilled chicken and [place].  Drink a lot of water. Decrease intake of fatty foods. Exercise for 30-45 min. 4-5 a week to decrease the risk of cardiac event.   The patient was encouraged to call or send  a message through [employer] for any questions or concerns.   Follow up: if symptoms persist or do not get better.   Side effects and appropriate use of all the medication(s) were discussed with the patient today. Patient advised to use the medication(s) as directed by their healthcare provider. The patient was encouraged to read, review, and understand all associated package inserts and contact our office with any questions or concerns. The patient accepts the risks of the treatment plan and had an opportunity to ask questions.   Staying healthy and adopting  a healthy lifestyle for your overall health is important. You should eat 7 or more servings of fruits and vegetables per day. You should drink plenty of water to keep yourself hydrated and your kidneys healthy. This includes about 65-80+ fluid ounces of water. Limit your intake of animal fats especially for elevated cholesterol. Avoid highly processed food and limit your salt intake if you have hypertension. Avoid foods high in saturated/Trans fats. Along with a healthy diet it is also very important to maintain time for yourself to maintain a healthy mental health with low stress levels. You should get atleast 150 min of moderate intensity exercise weekly for a healthy heart. Along with eating right and exercising, aim for at least 7-9 hours of sleep daily.  Eat more whole grains which includes barley, wheat berries, oats, brown rice and whole wheat pasta. Use healthy plant oils which include olive, soy, corn, sunflower and peanut. Limit your caffeine and sugary drinks. Limit your intake of fast foods. Limit milk and dairy products to one or two daily servings.   Patient was given opportunity to ask questions. Patient verbalized understanding of the plan and was able to repeat key elements of the plan. All questions were answered to their satisfaction.  Raman Mariabella Nilsen, DNP   I, Raman Anjelo Pullman have reviewed all documentation for this visit. The  documentation on 04/20/21 for the exam, diagnosis, procedures, and orders are all accurate and complete.   IF YOU HAVE BEEN REFERRED TO A SPECIALIST, IT MAY TAKE 1-2 WEEKS TO SCHEDULE/PROCESS THE REFERRAL. IF YOU HAVE NOT HEARD FROM US/SPECIALIST IN TWO WEEKS, PLEASE GIVE [place] A CALL AT [phone].   THE PATIENT IS ENCOURAGED TO PRACTICE SOCIAL DISTANCING DUE TO THE COVID-19 PANDEMIC.

## 2021-04-28 ENCOUNTER — Other Ambulatory Visit: Payer: Self-pay

## 2021-04-28 ENCOUNTER — Ambulatory Visit (INDEPENDENT_AMBULATORY_CARE_PROVIDER_SITE_OTHER): Payer: BC Managed Care – PPO | Admitting: Nurse Practitioner

## 2021-04-28 VITALS — BP 132/70 | HR 67 | Temp 98.7°F | Ht 63.6 in | Wt 255.6 lb

## 2021-04-28 DIAGNOSIS — R7303 Prediabetes: Secondary | ICD-10-CM | POA: Diagnosis not present

## 2021-04-28 DIAGNOSIS — E782 Mixed hyperlipidemia: Secondary | ICD-10-CM | POA: Diagnosis not present

## 2021-04-28 DIAGNOSIS — Z6841 Body Mass Index (BMI) 40.0 and over, adult: Secondary | ICD-10-CM

## 2021-04-28 DIAGNOSIS — Z23 Encounter for immunization: Secondary | ICD-10-CM | POA: Diagnosis not present

## 2021-04-28 DIAGNOSIS — F988 Other specified behavioral and emotional disorders with onset usually occurring in childhood and adolescence: Secondary | ICD-10-CM | POA: Diagnosis not present

## 2021-04-28 DIAGNOSIS — I10 Essential (primary) hypertension: Secondary | ICD-10-CM | POA: Diagnosis not present

## 2021-04-28 DIAGNOSIS — Z0000 Encounter for general adult medical examination without abnormal findings: Secondary | ICD-10-CM | POA: Diagnosis not present

## 2021-04-28 LAB — POCT URINALYSIS DIPSTICK
Bilirubin, UA: NEGATIVE
Glucose, UA: NEGATIVE
Ketones, UA: NEGATIVE
Leukocytes, UA: NEGATIVE
Nitrite, UA: NEGATIVE
Protein, UA: NEGATIVE
Spec Grav, UA: 1.025 (ref 1.010–1.025)
Urobilinogen, UA: 0.2 E.U./dL
pH, UA: 6 (ref 5.0–8.0)

## 2021-04-28 LAB — POCT UA - MICROALBUMIN
Albumin/Creatinine Ratio, Urine, POC: <30
Creatinine, POC: 300 mg/dL
Microalbumin Ur, POC: 10 mg/L

## 2021-04-28 MED ORDER — LISDEXAMFETAMINE DIMESYLATE 40 MG PO CAPS: 40.0000 mg | ORAL_CAPSULE | Freq: Every day | ORAL | 0 refills | Status: DC

## 2021-04-28 NOTE — Patient Instructions (Signed)
Health Maintenance, Female Adopting a healthy lifestyle and getting preventive care are important in promoting health and wellness. Ask your health care provider about: The right schedule for you to have regular tests and exams. Things you can do on your own to prevent diseases and keep yourself healthy. What should I know about diet, weight, and exercise? Eat a healthy diet  Eat a diet that includes plenty of vegetables, fruits, low-fat dairy products, and lean protein. Do not eat a lot of foods that are high in solid fats, added sugars, or sodium. Maintain a healthy weight Body mass index (BMI) is used to identify weight problems. It estimates body fat based on height and weight. Your health care provider can help determine your BMI and help you achieve or maintain a healthy weight. Get regular exercise Get regular exercise. This is one of the most important things you can do for your health. Most adults should: Exercise for at least 150 minutes each week. The exercise should increase your heart rate and make you sweat (moderate-intensity exercise). Do strengthening exercises at least twice a week. This is in addition to the moderate-intensity exercise. Spend less time sitting. Even light physical activity can be beneficial. Watch cholesterol and blood lipids Have your blood tested for lipids and cholesterol at 50 years of age, then have this test every 5 years. Have your cholesterol levels checked more often if: Your lipid or cholesterol levels are high. You are older than 50 years of age. You are at high risk for heart disease. What should I know about cancer screening? Depending on your health history and family history, you may need to have cancer screening at various ages. This may include screening for: Breast cancer. Cervical cancer. Colorectal cancer. Skin cancer. Lung cancer. What should I know about heart disease, diabetes, and high blood pressure? Blood pressure and heart  disease High blood pressure causes heart disease and increases the risk of stroke. This is more likely to develop in people who have high blood pressure readings, are of African descent, or are overweight. Have your blood pressure checked: Every 3-5 years if you are 18-39 years of age. Every year if you are 40 years old or older. Diabetes Have regular diabetes screenings. This checks your fasting blood sugar level. Have the screening done: Once every three years after age 40 if you are at a normal weight and have a low risk for diabetes. More often and at a younger age if you are overweight or have a high risk for diabetes. What should I know about preventing infection? Hepatitis B If you have a higher risk for hepatitis B, you should be screened for this virus. Talk with your health care provider to find out if you are at risk for hepatitis B infection. Hepatitis C Testing is recommended for: Everyone born from 1945 through 1965. Anyone with known risk factors for hepatitis C. Sexually transmitted infections (STIs) Get screened for STIs, including gonorrhea and chlamydia, if: You are sexually active and are younger than 50 years of age. You are older than 50 years of age and your health care provider tells you that you are at risk for this type of infection. Your sexual activity has changed since you were last screened, and you are at increased risk for chlamydia or gonorrhea. Ask your health care provider if you are at risk. Ask your health care provider about whether you are at high risk for HIV. Your health care provider may recommend a prescription medicine   to help prevent HIV infection. If you choose to take medicine to prevent HIV, you should first get tested for HIV. You should then be tested every 3 months for as long as you are taking the medicine. Pregnancy If you are about to stop having your period (premenopausal) and you may become pregnant, seek counseling before you get  pregnant. Take 400 to 800 micrograms (mcg) of folic acid every day if you become pregnant. Ask for birth control (contraception) if you want to prevent pregnancy. Osteoporosis and menopause Osteoporosis is a disease in which the bones lose minerals and strength with aging. This can result in bone fractures. If you are 65 years old or older, or if you are at risk for osteoporosis and fractures, ask your health care provider if you should: Be screened for bone loss. Take a calcium or vitamin D supplement to lower your risk of fractures. Be given hormone replacement therapy (HRT) to treat symptoms of menopause. Follow these instructions at home: Lifestyle Do not use any products that contain nicotine or tobacco, such as cigarettes, e-cigarettes, and chewing tobacco. If you need help quitting, ask your health care provider. Do not use street drugs. Do not share needles. Ask your health care provider for help if you need support or information about quitting drugs. Alcohol use Do not drink alcohol if: Your health care provider tells you not to drink. You are pregnant, may be pregnant, or are planning to become pregnant. If you drink alcohol: Limit how much you use to 0-1 drink a day. Limit intake if you are breastfeeding. Be aware of how much alcohol is in your drink. In the [place] one drink equals one 12 oz bottle of beer (355 mL), one 5 oz glass of wine (148 mL), or one 1 oz glass of hard liquor (44 mL). General instructions Schedule regular health, dental, and eye exams. Stay current with your vaccines. Tell your health care provider if: You often feel depressed. You have ever been abused or do not feel safe at home. Summary Adopting a healthy lifestyle and getting preventive care are important in promoting health and wellness. Follow your health care provider's instructions about healthy diet, exercising, and getting tested or screened for diseases. Follow your health care provider's  instructions on monitoring your cholesterol and blood pressure. This information is not intended to replace advice given to you by your health care provider. Make sure you discuss any questions you have with your health care provider. Document Revised: 08/20/2020 Document Reviewed: 06/05/2018 Elsevier Patient Education  2022 Elsevier Inc.  

## 2021-04-28 NOTE — Progress Notes (Signed)
I,Tianna Badgett,acting as a Education administrator for [employer], NP.,have documented all relevant documentation on the behalf of [employer], NP,as directed by  Bary Castilla, NP while in the presence of Bary Castilla, NP.  This visit occurred during the SARS-CoV-2 public health emergency.  Safety protocols were in place, including screening questions prior to the visit, additional usage of staff PPE, and extensive cleaning of exam room while observing appropriate contact time as indicated for disinfecting solutions.  Subjective:     Patient ID: Brooke [name] , female    DOB: 22-Dec-1970 , 50 y.o.   MRN: [id number]   Chief Complaint  Patient presents with   Annual Exam    HPI  The patient is here today for a physical examination.   She goes to [facility] for her paps, she was seeing Dr. Margie Billet who has recently retired. She has yet to establish with new provider. She had a papssmear last year.  She is not dieting or exercising.  Mammogram is going to schedule one for this year.   Hypertension This is a chronic problem. The current episode started more than 1 year ago. The problem has been gradually improving since onset. The problem is controlled. Pertinent negatives include no blurred vision, chest pain, headaches, palpitations or shortness of breath.    Past Medical History:  Diagnosis Date   ADD (attention deficit disorder)    Allergy    allergic rhinitis   Anemia    nos   Anxiety    Arthritis    right ankle   Asthma    as a child   Back pain    Constipation    Depression    Elevated blood pressure reading without diagnosis of hypertension    Genital herpes    Lactose intolerance    Migraine    Prediabetes    Sleep apnea    Stress    Swelling      Family History  Problem Relation Age of Onset   Hypertension Mother    Diabetes Mother    Hyperlipidemia Mother    Hypertension Father    Sleep apnea Father    Alcoholism Father    Obesity Father     Hypertension Other    Diabetes Maternal Aunt    Diabetes Maternal Uncle    Heart failure Paternal Aunt    Diabetes Paternal Aunt    Heart failure Maternal Grandmother    Heart failure Maternal Grandfather    Breast cancer Sister      Current Outpatient Medications:    cycloSPORINE (RESTASIS) 0.05 % ophthalmic emulsion, Place 1 drop into both eyes 2 (two) times daily., Disp: , Rfl:    lisdexamfetamine (VYVANSE) 40 MG capsule, Take 1 capsule (40 mg total) by mouth daily., Disp: 30 capsule, Rfl: 0   Multiple Vitamin (MULTIVITAMIN) tablet, Take 1 tablet by mouth daily., Disp: , Rfl:    nebivolol (BYSTOLIC) 10 MG tablet, Take 1 tablet (10 mg total) by mouth daily., Disp: 30 tablet, Rfl: 0   Semaglutide-Weight Management 2.4 MG/0.75ML SOAJ, INJECT 2.4 MG INTO THE SKIN ONCE A WEEK., Disp: 3 mL, Rfl: 1   sertraline (ZOLOFT) 100 MG tablet, Take 1 tablet (100 mg total) by mouth daily., Disp: 90 tablet, Rfl: 1   valACYclovir (VALTREX) 500 MG tablet, Take 1 tablet (500 mg total) by mouth as needed (genital herpes)., Disp: 90 tablet, Rfl: 0   valsartan (DIOVAN) 160 MG tablet, Take 1 tablet (160 mg total) by mouth daily., Disp: 30  tablet, Rfl: 11   Vitamin D, Cholecalciferol, 25 MCG (1000 UT) TABS, Take 5,000 Units by mouth daily. , Disp: , Rfl:    No Known Allergies    The patient states she uses none for birth control. Last LMP was No LMP recorded. (Menstrual status: IUD).. Negative for Dysmenorrhea. Negative for: breast discharge, breast lump(s), breast pain and breast self exam. Associated symptoms include abnormal vaginal bleeding. Pertinent negatives include abnormal bleeding (hematology), anxiety, decreased libido, depression, difficulty falling sleep, dyspareunia, history of infertility, nocturia, sexual dysfunction, sleep disturbances, urinary incontinence, urinary urgency, vaginal discharge and vaginal itching. Diet regular.The patient states her exercise level is    . The patient's tobacco use  is:  Social History   Tobacco Use  Smoking Status Never  Smokeless Tobacco Never  . She has been exposed to passive smoke. The patient's alcohol use is:  Social History   Substance and Sexual Activity  Alcohol Use No   Comment: OCC  . Additional information: Last pap , next one scheduled for 2021.    Review of Systems  Constitutional: Negative.  Negative for chills and fever.  HENT: Negative.  Negative for congestion, sinus pain and sneezing.   Eyes: Negative.  Negative for blurred vision.  Respiratory: Negative.  Negative for cough and shortness of breath.   Cardiovascular: Negative.  Negative for chest pain and palpitations.  Gastrointestinal: Negative.  Negative for constipation and diarrhea.  Endocrine: Negative.  Negative for polydipsia and polyphagia.  Genitourinary: Negative.   Musculoskeletal: Negative.  Negative for arthralgias and myalgias.  Skin: Negative.   Allergic/Immunologic: Negative.   Neurological: Negative.  Negative for dizziness, seizures, weakness and headaches.  Hematological: Negative.   Psychiatric/Behavioral: Negative.      Today's Vitals   04/28/21 1518  BP: 132/70  Pulse: 67  Temp: 98.7 F (37.1 C)  TempSrc: Oral  Weight: 255 lb 9.6 oz (115.9 kg)  Height: 5' 3.6" (1.615 m)   Body mass index is 44.43 kg/m.  Wt Readings from Last 3 Encounters:  04/28/21 255 lb 9.6 oz (115.9 kg)  04/20/21 261 lb 12.8 oz (118.8 kg)  03/17/21 254 lb 9.6 oz (115.5 kg)    Objective:  Physical Exam Vitals and nursing note reviewed.  Constitutional:      Appearance: Normal appearance. She is obese.  HENT:     Head: Normocephalic and atraumatic.     Right Ear: Tympanic membrane, ear canal and external ear normal. There is no impacted cerumen.     Left Ear: Tympanic membrane, ear canal and external ear normal. There is no impacted cerumen.     Nose: Nose normal. No congestion or rhinorrhea.     Mouth/Throat:     Mouth: Mucous membranes are moist.      Pharynx: Oropharynx is clear. No oropharyngeal exudate or posterior oropharyngeal erythema.  Eyes:     Extraocular Movements: Extraocular movements intact.     Conjunctiva/sclera: Conjunctivae normal.     Pupils: Pupils are equal, round, and reactive to light.  Cardiovascular:     Rate and Rhythm: Normal rate and regular rhythm.     Pulses: Normal pulses.     Heart sounds: Normal heart sounds. No murmur heard. Pulmonary:     Effort: Pulmonary effort is normal. No respiratory distress.     Breath sounds: Normal breath sounds. No wheezing.  Abdominal:     General: Abdomen is flat. Bowel sounds are normal.     Palpations: Abdomen is soft.  Genitourinary:  Comments: deferred Musculoskeletal:        General: Normal range of motion.     Cervical back: Normal range of motion and neck supple.  Skin:    General: Skin is warm and dry.     Capillary Refill: Capillary refill takes less than 2 seconds.  Neurological:     General: No focal deficit present.     Mental Status: She is alert and oriented to person, place, and time.  Psychiatric:        Mood and Affect: Mood normal.        Behavior: Behavior normal.        Assessment And Plan:     1. Annual physical exam -Patient is here for their annual physical exam and we discussed any changes to medication and medical history.  -Behavior modification was discussed as well as diet and exercise history  -Patient will continue to exercise regularly and modify their diet.  -Recommendation for yearly physical annuals, immunization and screenings including mammogram and colonoscopy were discussed with the patient.  -Recommended intake of multivitamin, vitamin D and calcium.  -Individualized advise was given to the patient pertaining to their own health history in regards to diet, exercise, medical condition and referrals.   2. Essential hypertension -Limit the intake of processed foods and salt intake. You should increase your intake of green  vegetables and fruits. Limit the use of alcohol. Limit fast foods and fried foods. Avoid high fatty saturated and trans fat foods. Keep yourself hydrated with drinking water. Avoid red meats. Eat lean meats instead. Exercise for atleast 30-45 min for atleast 4-5 times a week.  - POCT Urinalysis Dipstick (81002) - POCT UA - Microalbumin - EKG 12-Lead - CBC - CMP14+EGFR  3. Prediabetes --Discussed with patient the importance of glycemic control and long term complications from uncontrolled diabetes. Discussed with the patient the importance of compliance with home glucose monitoring, diet which includes decrease amount of sugary drinks and foods. Importance of exercise was also discussed with the patient. Importance of eye exams, self foot care and compliance to office visits was also discussed with the patient.  - Hemoglobin A1c  4. 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  Dispense: 30 capsule; Refill: 0  6 Need for influenza vaccination - Flu Vaccine QUAD 6+ mos PF IM (Fluarix Quad PF)  7. Class 3 severe obesity due to excess calories with serious comorbidity and body mass index (BMI) of 40.0 to 44.9 in adult [facility]) Advised patient on a healthy diet including avoiding fast food and red meats. Increase the intake of lean meats including grilled chicken and [place].  Drink a lot of water. Decrease intake of fatty foods. Exercise for 30-45 min. 4-5 a week to decrease the risk of cardiac event.   The patient was encouraged to call or send a message through [facility] for any questions or concerns.   Follow up: if symptoms persist or do not get better.   Side effects and appropriate use of all the medication(s) were discussed with the patient today. Patient advised to use the medication(s) as directed by their healthcare provider. The patient was encouraged  to read, review, and understand all associated package inserts and contact our office with any questions or concerns. The patient accepts the risks of the treatment plan and had an opportunity to ask questions.   Staying healthy and  adopting a healthy lifestyle for your overall health is important. You should eat 7 or more servings of fruits and vegetables per day. You should drink plenty of water to keep yourself hydrated and your kidneys healthy. This includes about 65-80+ fluid ounces of water. Limit your intake of animal fats especially for elevated cholesterol. Avoid highly processed food and limit your salt intake if you have hypertension. Avoid foods high in saturated/Trans fats. Along with a healthy diet it is also very important to maintain time for yourself to maintain a healthy mental health with low stress levels. You should get atleast 150 min of moderate intensity exercise weekly for a healthy heart. Along with eating right and exercising, aim for at least 7-9 hours of sleep daily.  Eat more whole grains which includes barley, wheat berries, oats, brown rice and whole wheat pasta. Use healthy plant oils which include olive, soy, corn, sunflower and peanut. Limit your caffeine and sugary drinks. Limit your intake of fast foods. Limit milk and dairy products to one or two daily servings.   Patient was given opportunity to ask questions. Patient verbalized understanding of the plan and was able to repeat key elements of the plan. All questions were answered to their satisfaction.  Raman Artavis Cowie, DNP   I, Raman Jo Booze have reviewed all documentation for this visit. The documentation on 04/28/21 for the exam, diagnosis, procedures, and orders are all accurate and complete.    THE PATIENT IS ENCOURAGED TO PRACTICE SOCIAL DISTANCING DUE TO THE COVID-19 PANDEMIC.

## 2021-04-29 LAB — LIPID PANEL
Chol/HDL Ratio: 2.7 ratio (ref 0.0–4.4)
Cholesterol, Total: 195 mg/dL (ref 100–199)
HDL: 73 mg/dL (ref >39)
LDL Chol Calc (NIH): 114 mg/dL — ABNORMAL HIGH (ref 0–99)
Triglycerides: 40 mg/dL (ref 0–149)
VLDL Cholesterol Cal: 8 mg/dL (ref 5–40)

## 2021-04-29 LAB — CMP14+EGFR
ALT: 16 IU/L (ref 0–32)
AST: 20 IU/L (ref 0–40)
Albumin/Globulin Ratio: 1.3 (ref 1.2–2.2)
Albumin: 4.2 g/dL (ref 3.8–4.8)
Alkaline Phosphatase: 105 IU/L (ref 44–121)
BUN/Creatinine Ratio: 10 (ref 9–23)
BUN: 8 mg/dL (ref 6–24)
Bilirubin Total: 0.3 mg/dL (ref 0.0–1.2)
CO2: 28 mmol/L (ref 20–29)
Calcium: 9.2 mg/dL (ref 8.7–10.2)
Chloride: 101 mmol/L (ref 96–106)
Creatinine, Ser: 0.8 mg/dL (ref 0.57–1.00)
Globulin, Total: 3.2 g/dL (ref 1.5–4.5)
Glucose: 84 mg/dL (ref 70–99)
Potassium: 4.4 mmol/L (ref 3.5–5.2)
Sodium: 140 mmol/L (ref 134–144)
Total Protein: 7.4 g/dL (ref 6.0–8.5)
eGFR: 90 mL/min/{1.73_m2} (ref >59)

## 2021-04-29 LAB — HEMOGLOBIN A1C
Est. average glucose Bld gHb Est-mCnc: 103 mg/dL
Hgb A1c MFr Bld: 5.2 % (ref 4.8–5.6)

## 2021-04-29 LAB — CBC
Hematocrit: 37.9 % (ref 34.0–46.6)
Hemoglobin: 11.9 g/dL (ref 11.1–15.9)
MCH: 24.4 pg — ABNORMAL LOW (ref 26.6–33.0)
MCHC: 31.4 g/dL — ABNORMAL LOW (ref 31.5–35.7)
MCV: 78 fL — ABNORMAL LOW (ref 79–97)
Platelets: 393 10*3/uL (ref 150–450)
RBC: 4.87 x10E6/uL (ref 3.77–5.28)
RDW: 14.5 % (ref 11.7–15.4)
WBC: 5.9 10*3/uL (ref 3.4–10.8)

## 2021-05-03 ENCOUNTER — Ambulatory Visit
Admission: RE | Admit: 2021-05-03 | Discharge: 2021-05-03 | Disposition: A | Discharge status: Home / Self Care | Payer: BC Managed Care – PPO | Source: Ambulatory Visit | Attending: Family Medicine | Admitting: Family Medicine

## 2021-05-03 ENCOUNTER — Other Ambulatory Visit: Payer: Self-pay | Admitting: Family Medicine

## 2021-05-03 DIAGNOSIS — R1012 Left upper quadrant pain: Secondary | ICD-10-CM

## 2021-05-04 ENCOUNTER — Encounter: Payer: Self-pay | Admitting: Internal Medicine

## 2021-05-05 ENCOUNTER — Other Ambulatory Visit: Payer: Self-pay

## 2021-05-05 NOTE — Telephone Encounter (Signed)
Prior auth response from [employer] says the PA has been resolved, no additional PA is required.

## 2021-05-24 ENCOUNTER — Other Ambulatory Visit: Payer: Self-pay | Admitting: Internal Medicine

## 2021-05-24 DIAGNOSIS — F331 Major depressive disorder, recurrent, moderate: Secondary | ICD-10-CM

## 2021-06-02 ENCOUNTER — Telehealth: Payer: Self-pay | Admitting: Neurology

## 2021-06-02 NOTE — Telephone Encounter (Signed)
Voicemail left and mychart msg sent asking pt to contact us to r/s 12/22 appt with Dr. Rexene Alberts.

## 2021-06-16 ENCOUNTER — Ambulatory Visit: Payer: BC Managed Care – PPO | Admitting: Neurology

## 2021-07-12 ENCOUNTER — Encounter: Payer: Self-pay | Admitting: Internal Medicine

## 2021-07-12 ENCOUNTER — Other Ambulatory Visit: Payer: Self-pay | Admitting: Internal Medicine

## 2021-07-12 DIAGNOSIS — F988 Other specified behavioral and emotional disorders with onset usually occurring in childhood and adolescence: Secondary | ICD-10-CM

## 2021-07-12 MED ORDER — LISDEXAMFETAMINE DIMESYLATE 40 MG PO CAPS: 40.0000 mg | ORAL_CAPSULE | Freq: Every day | ORAL | 0 refills | Status: DC

## 2021-08-01 ENCOUNTER — Encounter: Payer: Self-pay | Admitting: Internal Medicine

## 2021-08-02 ENCOUNTER — Ambulatory Visit: Payer: BC Managed Care – PPO | Admitting: Internal Medicine

## 2021-08-22 ENCOUNTER — Other Ambulatory Visit: Payer: Self-pay

## 2021-08-22 ENCOUNTER — Ambulatory Visit: Payer: BC Managed Care – PPO | Admitting: Internal Medicine

## 2021-08-22 ENCOUNTER — Encounter: Payer: Self-pay | Admitting: Internal Medicine

## 2021-08-22 ENCOUNTER — Other Ambulatory Visit: Payer: Self-pay | Admitting: Internal Medicine

## 2021-08-22 VITALS — BP 146/84 | HR 90 | Temp 98.0°F | Ht 62.8 in | Wt 269.4 lb

## 2021-08-22 DIAGNOSIS — R635 Abnormal weight gain: Secondary | ICD-10-CM | POA: Diagnosis not present

## 2021-08-22 DIAGNOSIS — F331 Major depressive disorder, recurrent, moderate: Secondary | ICD-10-CM

## 2021-08-22 DIAGNOSIS — Z23 Encounter for immunization: Secondary | ICD-10-CM | POA: Diagnosis not present

## 2021-08-22 DIAGNOSIS — I10 Essential (primary) hypertension: Secondary | ICD-10-CM

## 2021-08-22 DIAGNOSIS — Z6841 Body Mass Index (BMI) 40.0 and over, adult: Secondary | ICD-10-CM

## 2021-08-22 MED ORDER — SERTRALINE HCL 100 MG PO TABS: ORAL_TABLET | ORAL | 1 refills | Status: DC

## 2021-08-22 MED ORDER — NEBIVOLOL HCL 10 MG PO TABS: 10.0000 mg | ORAL_TABLET | Freq: Every day | ORAL | 5 refills | Status: DC

## 2021-08-22 NOTE — Progress Notes (Signed)
Rich Brave Llittleton,acting as a Education administrator for Maximino Greenland, MD.,have documented all relevant documentation on the behalf of Maximino Greenland, MD,as directed by  Maximino Greenland, MD while in the presence of Maximino Greenland, MD.  This visit occurred during the SARS-CoV-2 public health emergency.  Safety protocols were in place, including screening questions prior to the visit, additional usage of staff PPE, and extensive cleaning of exam room while observing appropriate contact time as indicated for disinfecting solutions.  Subjective:     Patient ID: Brooke [name] , female    DOB: 29-Jan-1971 , 51 y.o.   MRN: [id number]   Chief Complaint  Patient presents with   Hypertension    HPI  Patient presents today for a bp check. She reports the last time she was her Raman NP increased bystolic to 27NT. She reports she has been out of her medicine for a couple weeks now.   Hypertension This is a chronic problem. The current episode started more than 1 year ago. The problem has been gradually improving since onset. The problem is controlled. Associated symptoms include headaches. Pertinent negatives include no blurred vision, chest pain, palpitations or shortness of breath. Risk factors for coronary artery disease include obesity. Past treatments include diuretics. The current treatment provides mild improvement. Compliance problems include exercise.     Past Medical History:  Diagnosis Date   ADD (attention deficit disorder)    Allergy    allergic rhinitis   Anemia    nos   Anxiety    Arthritis    right ankle   Asthma    as a child   Back pain    Constipation    Depression    Elevated blood pressure reading without diagnosis of hypertension    Genital herpes    Lactose intolerance    Migraine    Prediabetes    Sleep apnea    Stress    Swelling      Family History  Problem Relation Age of Onset   Hypertension Mother    Diabetes Mother    Hyperlipidemia Mother     Hypertension Father    Sleep apnea Father    Alcoholism Father    Obesity Father    Hypertension Other    Diabetes Maternal Aunt    Diabetes Maternal Uncle    Heart failure Paternal Aunt    Diabetes Paternal Aunt    Heart failure Maternal Grandmother    Heart failure Maternal Grandfather    Breast cancer Sister      Current Outpatient Medications:    lisdexamfetamine (VYVANSE) 40 MG capsule, Take 1 capsule (40 mg total) by mouth daily., Disp: 30 capsule, Rfl: 0   Multiple Vitamin (MULTIVITAMIN) tablet, Take 1 tablet by mouth daily., Disp: , Rfl:    Semaglutide-Weight Management 2.4 MG/0.75ML SOAJ, INJECT 2.4 MG INTO THE SKIN ONCE A WEEK., Disp: 3 mL, Rfl: 1   valACYclovir (VALTREX) 500 MG tablet, Take 1 tablet (500 mg total) by mouth as needed (genital herpes)., Disp: 90 tablet, Rfl: 0   valsartan (DIOVAN) 160 MG tablet, TAKE 1 TABLET(160 MG) BY MOUTH DAILY, Disp: 30 tablet, Rfl: 11   Vitamin D, Cholecalciferol, 25 MCG (1000 UT) TABS, Take 5,000 Units by mouth daily. , Disp: , Rfl:    nebivolol (BYSTOLIC) 10 MG tablet, Take 1 tablet (10 mg total) by mouth daily., Disp: 30 tablet, Rfl: 5   sertraline (ZOLOFT) 100 MG tablet, TAKE 1 TABLET(100 MG) BY MOUTH DAILY, Disp:  90 tablet, Rfl: 1   No Known Allergies   Review of Systems  Constitutional: Negative.   Eyes:  Negative for blurred vision.  Respiratory: Negative.  Negative for shortness of breath.   Cardiovascular: Negative.  Negative for chest pain and palpitations.  Neurological:  Positive for headaches.  Psychiatric/Behavioral: Negative.         Unfortunately, she is still dealing with a lot of stress at work.     Today's Vitals   08/22/21 1550 08/22/21 1637  BP: 132/90 (!) 146/84  Pulse: 90   Temp: 98 F (36.7 C)   Weight: 269 lb 6.4 oz (122.2 kg)   Height: 5' 2.8" (1.595 m)   PainSc: 0-No pain    Body mass index is 48.03 kg/m.  Wt Readings from Last 3 Encounters:  08/22/21 269 lb 6.4 oz (122.2 kg)  04/28/21 255 lb  9.6 oz (115.9 kg)  04/20/21 261 lb 12.8 oz (118.8 kg)     Objective:  Physical Exam Vitals and nursing note reviewed.  Constitutional:      Appearance: Normal appearance. She is obese.  HENT:     Head: Normocephalic and atraumatic.     Nose:     Comments: Masked     Mouth/Throat:     Comments: Masked  Eyes:     Extraocular Movements: Extraocular movements intact.  Cardiovascular:     Rate and Rhythm: Normal rate and regular rhythm.     Heart sounds: Normal heart sounds.  Pulmonary:     Effort: Pulmonary effort is normal.     Breath sounds: Normal breath sounds.  Musculoskeletal:     Cervical back: Normal range of motion.  Skin:    General: Skin is warm.  Neurological:     General: No focal deficit present.     Mental Status: She is alert.  Psychiatric:        Mood and Affect: Mood normal.        Behavior: Behavior normal.        Assessment And Plan:     1. Essential hypertension Comments: Chronic, uncontrolled. She is encouraged to follow low sodium diet and incorporate more exercise into her daily routine.  - nebivolol (BYSTOLIC) 10 MG tablet; Take 1 tablet (10 mg total) by mouth daily.  Dispense: 30 tablet; Refill: 5 - CMP14+EGFR  2. Moderate episode of recurrent major depressive disorder ([place]) Comments: Chronic, she feels her sx are stable. I will send refill of sertraline. - sertraline (ZOLOFT) 100 MG tablet; TAKE 1 TABLET(100 MG) BY MOUTH DAILY  Dispense: 90 tablet; Refill: 1  3. Weight gain Comments: She is aware of 14 lb weight gain since Nov 2022. We discussed [employer] fitness, she agrees to read more about this program. I will also check labs as below.  - CMP14+EGFR - Insulin, random(561)  4. Class 3 severe obesity due to excess calories with serious comorbidity and body mass index (BMI) of 45.0 to 49.9 in adult [facility]) Comments: BMI 48. She is aware of 14 lb weight gain. She is encouraged to aim for at least 150 minutes of exercise per week.  5. Immunization  due Comments: She was given Shingrix IM x 1.  - Varicella-zoster vaccine IM (Shingrix)     Patient was given opportunity to ask questions. Patient verbalized understanding of the plan and was able to repeat key elements of the plan. All questions were answered to their satisfaction.   I, Maximino Greenland, MD, have reviewed all documentation for this  visit. The documentation on 08/22/21 for the exam, diagnosis, procedures, and orders are all accurate and complete.   IF YOU HAVE BEEN REFERRED TO A SPECIALIST, IT MAY TAKE 1-2 WEEKS TO SCHEDULE/PROCESS THE REFERRAL. IF YOU HAVE NOT HEARD FROM US/SPECIALIST IN TWO WEEKS, PLEASE GIVE [place] A CALL AT [phone] X [phone].   THE PATIENT IS ENCOURAGED TO PRACTICE SOCIAL DISTANCING DUE TO THE COVID-19 PANDEMIC.

## 2021-08-22 NOTE — Progress Notes (Signed)
Rich Brave Llittleton,acting as a Education administrator for Maximino Greenland, MD.,have documented all relevant documentation on the behalf of Maximino Greenland, MD,as directed by  Maximino Greenland, MD while in the presence of Maximino Greenland, MD.  This visit occurred during the SARS-CoV-2 public health emergency.  Safety protocols were in place, including screening questions prior to the visit, additional usage of staff PPE, and extensive cleaning of exam room while observing appropriate contact time as indicated for disinfecting solutions.  Subjective:     Patient ID: Brooke [name] , female    DOB: 22-Oct-1970 , 51 y.o.   MRN: [id number]   Chief Complaint  Patient presents with   Hypertension    HPI  Patient presents today for a bp check. She reports the last time she was her Raman NP increased bystolic to 10mg . She reports she has been out of her medicine for a couple weeks now.   Hypertension Associated symptoms include headaches. Pertinent negatives include no chest pain, palpitations or shortness of breath.    Past Medical History:  Diagnosis Date   ADD (attention deficit disorder)    Allergy    allergic rhinitis   Anemia    nos   Anxiety    Arthritis    right ankle   Asthma    as a child   Back pain    Constipation    Depression    Elevated blood pressure reading without diagnosis of hypertension    Genital herpes    Lactose intolerance    Migraine    Prediabetes    Sleep apnea    Stress    Swelling      Family History  Problem Relation Age of Onset   Hypertension Mother    Diabetes Mother    Hyperlipidemia Mother    Hypertension Father    Sleep apnea Father    Alcoholism Father    Obesity Father    Hypertension Other    Diabetes Maternal Aunt    Diabetes Maternal Uncle    Heart failure Paternal Aunt    Diabetes Paternal Aunt    Heart failure Maternal Grandmother    Heart failure Maternal Grandfather    Breast cancer Sister      Current Outpatient Medications:     lisdexamfetamine (VYVANSE) 40 MG capsule, Take 1 capsule (40 mg total) by mouth daily., Disp: 30 capsule, Rfl: 0   Multiple Vitamin (MULTIVITAMIN) tablet, Take 1 tablet by mouth daily., Disp: , Rfl:    Semaglutide-Weight Management 2.4 MG/0.75ML SOAJ, INJECT 2.4 MG INTO THE SKIN ONCE A WEEK., Disp: 3 mL, Rfl: 1   sertraline (ZOLOFT) 100 MG tablet, TAKE 1 TABLET(100 MG) BY MOUTH DAILY, Disp: 90 tablet, Rfl: 1   valACYclovir (VALTREX) 500 MG tablet, Take 1 tablet (500 mg total) by mouth as needed (genital herpes)., Disp: 90 tablet, Rfl: 0   valsartan (DIOVAN) 160 MG tablet, TAKE 1 TABLET(160 MG) BY MOUTH DAILY, Disp: 30 tablet, Rfl: 11   Vitamin D, Cholecalciferol, 25 MCG (1000 UT) TABS, Take 5,000 Units by mouth daily. , Disp: , Rfl:    nebivolol (BYSTOLIC) 10 MG tablet, Take 1 tablet (10 mg total) by mouth daily. (Patient not taking: Reported on 08/22/2021), Disp: 30 tablet, Rfl: 0   No Known Allergies   Review of Systems  Respiratory:  Negative for shortness of breath.   Cardiovascular:  Negative for chest pain and palpitations.  Neurological:  Positive for headaches.    Today's Vitals  08/22/21 1550  BP: 132/90  Pulse: 90  Temp: 98 F (36.7 C)  Weight: 269 lb 6.4 oz (122.2 kg)  Height: 5' 2.8" (1.595 m)  PainSc: 0-No pain   Body mass index is 48.03 kg/m.  Wt Readings from Last 3 Encounters:  08/22/21 269 lb 6.4 oz (122.2 kg)  04/28/21 255 lb 9.6 oz (115.9 kg)  04/20/21 261 lb 12.8 oz (118.8 kg)     Objective:  Physical Exam      Assessment And Plan:     1. Essential hypertension  2. Immunization due  3. Class 3 severe obesity due to excess calories with serious comorbidity and body mass index (BMI) of 45.0 to 49.9 in adult [facility])     Patient was given opportunity to ask questions. Patient verbalized understanding of the plan and was able to repeat key elements of the plan. All questions were answered to their satisfaction.  Maximino Greenland, MD   I, Maximino Greenland, MD, have reviewed all documentation for this visit. The documentation on 08/22/21 for the exam, diagnosis, procedures, and orders are all accurate and complete.   IF YOU HAVE BEEN REFERRED TO A SPECIALIST, IT MAY TAKE 1-2 WEEKS TO SCHEDULE/PROCESS THE REFERRAL. IF YOU HAVE NOT HEARD FROM US/SPECIALIST IN TWO WEEKS, PLEASE GIVE [place] A CALL AT [phone] X [phone].   THE PATIENT IS ENCOURAGED TO PRACTICE SOCIAL DISTANCING DUE TO THE COVID-19 PANDEMIC.

## 2021-08-22 NOTE — Patient Instructions (Signed)

## 2021-08-23 LAB — CMP14+EGFR
ALT: 18 IU/L (ref 0–32)
AST: 20 IU/L (ref 0–40)
Albumin/Globulin Ratio: 1.4 (ref 1.2–2.2)
Albumin: 4.3 g/dL (ref 3.8–4.9)
Alkaline Phosphatase: 115 IU/L (ref 44–121)
BUN/Creatinine Ratio: 14 (ref 9–23)
BUN: 10 mg/dL (ref 6–24)
Bilirubin Total: 0.2 mg/dL (ref 0.0–1.2)
CO2: 27 mmol/L (ref 20–29)
Calcium: 9.7 mg/dL (ref 8.7–10.2)
Chloride: 101 mmol/L (ref 96–106)
Creatinine, Ser: 0.7 mg/dL (ref 0.57–1.00)
Globulin, Total: 3.1 g/dL (ref 1.5–4.5)
Glucose: 101 mg/dL — ABNORMAL HIGH (ref 70–99)
Potassium: 3.6 mmol/L (ref 3.5–5.2)
Sodium: 140 mmol/L (ref 134–144)
Total Protein: 7.4 g/dL (ref 6.0–8.5)
eGFR: 105 mL/min/{1.73_m2} (ref >59)

## 2021-08-23 LAB — INSULIN, RANDOM: INSULIN: 92.7 u[IU]/mL — ABNORMAL HIGH (ref 2.6–24.9)

## 2021-08-26 ENCOUNTER — Other Ambulatory Visit: Payer: Self-pay | Admitting: Internal Medicine

## 2021-08-26 DIAGNOSIS — F988 Other specified behavioral and emotional disorders with onset usually occurring in childhood and adolescence: Secondary | ICD-10-CM

## 2021-08-28 MED ORDER — LISDEXAMFETAMINE DIMESYLATE 40 MG PO CAPS: 40.0000 mg | ORAL_CAPSULE | Freq: Every day | ORAL | 0 refills | Status: DC

## 2021-09-06 ENCOUNTER — Encounter: Payer: Self-pay | Admitting: Internal Medicine

## 2021-10-25 ENCOUNTER — Encounter: Payer: Self-pay | Admitting: Internal Medicine

## 2021-10-25 ENCOUNTER — Ambulatory Visit: Payer: BC Managed Care – PPO | Admitting: Internal Medicine

## 2021-10-25 VITALS — BP 126/80 | HR 74 | Temp 98.0°F | Ht 65.6 in | Wt 275.8 lb

## 2021-10-25 DIAGNOSIS — F988 Other specified behavioral and emotional disorders with onset usually occurring in childhood and adolescence: Secondary | ICD-10-CM

## 2021-10-25 DIAGNOSIS — I10 Essential (primary) hypertension: Secondary | ICD-10-CM

## 2021-10-25 DIAGNOSIS — Z6841 Body Mass Index (BMI) 40.0 and over, adult: Secondary | ICD-10-CM

## 2021-10-25 DIAGNOSIS — E66813 Obesity, class 3: Secondary | ICD-10-CM

## 2021-10-25 MED ORDER — LISDEXAMFETAMINE DIMESYLATE 40 MG PO CAPS: 40.0000 mg | ORAL_CAPSULE | Freq: Every day | ORAL | 0 refills | Status: DC

## 2021-10-25 NOTE — Patient Instructions (Signed)

## 2021-10-25 NOTE — Progress Notes (Signed)
?Rich Brave Llittleton,acting as a Education administrator for Maximino Greenland, MD.,have documented all relevant documentation on the behalf of Maximino Greenland, MD,as directed by  Maximino Greenland, MD while in the presence of Maximino Greenland, MD.  ?This visit occurred during the SARS-CoV-2 public health emergency.  Safety protocols were in place, including screening questions prior to the visit, additional usage of staff PPE, and extensive cleaning of exam room while observing appropriate contact time as indicated for disinfecting solutions. ? ?Subjective:  ?  ? Patient ID: Brooke [name] , female    DOB: 11/23/1970 , 51 y.o.   MRN: [id number] ? ? ?Chief Complaint  ?Patient presents with  ? Weight Check  ? ADD F/U  ? ? ?HPI ? ?She presents today for a weight check. She has been taking Wegovy without any issues. She is now walking more frequently, she aims for at least five days per week.  ?  ? ?Past Medical History:  ?Diagnosis Date  ? ADD (attention deficit disorder)   ? Allergy   ? allergic rhinitis  ? Anemia   ? nos  ? Anxiety   ? Arthritis   ? right ankle  ? Asthma   ? as a child  ? Back pain   ? Constipation   ? Depression   ? Elevated blood pressure reading without diagnosis of hypertension   ? Genital herpes   ? Lactose intolerance   ? Migraine   ? Prediabetes   ? Sleep apnea   ? Stress   ? Swelling   ?  ? ?Family History  ?Problem Relation Age of Onset  ? Hypertension Mother   ? Diabetes Mother   ? Hyperlipidemia Mother   ? Hypertension Father   ? Sleep apnea Father   ? Alcoholism Father   ? Obesity Father   ? Hypertension Other   ? Diabetes Maternal Aunt   ? Diabetes Maternal Uncle   ? Heart failure Paternal Aunt   ? Diabetes Paternal Aunt   ? Heart failure Maternal Grandmother   ? Heart failure Maternal Grandfather   ? Breast cancer Sister   ? ? ? ?Current Outpatient Medications:  ?  Multiple Vitamin (MULTIVITAMIN) tablet, Take 1 tablet by mouth daily., Disp: , Rfl:  ?  nebivolol (BYSTOLIC) 10 MG tablet, Take 1 tablet  (10 mg total) by mouth daily., Disp: 30 tablet, Rfl: 5 ?  Semaglutide-Weight Management 2.4 MG/0.75ML SOAJ, INJECT 2.4 MG INTO THE SKIN ONCE A WEEK., Disp: 3 mL, Rfl: 1 ?  sertraline (ZOLOFT) 100 MG tablet, TAKE 1 TABLET(100 MG) BY MOUTH DAILY, Disp: 90 tablet, Rfl: 1 ?  valACYclovir (VALTREX) 500 MG tablet, Take 1 tablet (500 mg total) by mouth as needed (genital herpes)., Disp: 90 tablet, Rfl: 0 ?  valsartan (DIOVAN) 160 MG tablet, TAKE 1 TABLET(160 MG) BY MOUTH DAILY, Disp: 30 tablet, Rfl: 11 ?  Vitamin D, Cholecalciferol, 25 MCG (1000 UT) TABS, Take 5,000 Units by mouth daily. , Disp: , Rfl:  ?  lisdexamfetamine (VYVANSE) 40 MG capsule, Take 1 capsule (40 mg total) by mouth daily., Disp: 30 capsule, Rfl: 0  ? ?No Known Allergies  ? ?Review of Systems  ?Constitutional: Negative.   ?Respiratory: Negative.    ?Cardiovascular: Negative.   ?Gastrointestinal: Negative.   ?Neurological: Negative.   ?Psychiatric/Behavioral: Negative.     ? ?Today's Vitals  ? 10/25/21 1608  ?BP: 126/80  ?Pulse: 74  ?Temp: 98 ?F (36.7 ?C)  ?Weight: 275 lb 12.8 oz (125.1  kg)  ?Height: 5' 5.6" (1.666 m)  ?PainSc: 0-No pain  ? ?Body mass index is 45.06 kg/m?.  ?Wt Readings from Last 3 Encounters:  ?10/25/21 275 lb 12.8 oz (125.1 kg)  ?08/22/21 269 lb 6.4 oz (122.2 kg)  ?04/28/21 255 lb 9.6 oz (115.9 kg)  ?  ? ?Objective:  ?Physical Exam ?Vitals and nursing note reviewed.  ?Constitutional:   ?   Appearance: Normal appearance.  ?HENT:  ?   Head: Normocephalic and atraumatic.  ?Eyes:  ?   Extraocular Movements: Extraocular movements intact.  ?Cardiovascular:  ?   Rate and Rhythm: Normal rate and regular rhythm.  ?   Heart sounds: Normal heart sounds.  ?Pulmonary:  ?   Effort: Pulmonary effort is normal.  ?   Breath sounds: Normal breath sounds.  ?Musculoskeletal:  ?   Cervical back: Normal range of motion.  ?Skin: ?   General: Skin is warm.  ?Neurological:  ?   General: No focal deficit present.  ?   Mental Status: She is alert.  ?Psychiatric:      ?   Mood and Affect: Mood normal.     ?   Behavior: Behavior normal.  ?   ?Assessment And Plan:  ?   ?1. Class 3 severe obesity due to excess calories with serious comorbidity and body mass index (BMI) of 45.0 to 49.9 in adult [facility]) ?Comments: She is aware of 6lb weight gain. She will c/w daily walks and encouraged to incorporate more strength training into her exercise routine. F/u in 8 wks.  ? ?2. Essential hypertension ?Comments: Chronic, well controlled. She is encouraged to follow low sodium diet.  ? 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 PDMP reviewed.  ?- lisdexamfetamine (VYVANSE) 40 MG capsule; Take 1 capsule (40 mg total) by mouth daily.  Dispense: 30 capsule; Refill: 0 ?  ?Patient was given opportunity to ask questions. Patient verbalized understanding of the plan and was able to repeat key elements of the plan. All questions were answered to their satisfaction.  ? ?I, Maximino Greenland, MD, have reviewed all documentation for this visit. The documentation on 10/25/21 for the exam, diagnosis, procedures, and orders are all accurate and complete.  ? ?IF YOU HAVE BEEN REFERRED TO A SPECIALIST, IT MAY TAKE 1-2 WEEKS TO SCHEDULE/PROCESS THE REFERRAL. IF YOU HAVE NOT HEARD FROM US/SPECIALIST IN TWO WEEKS, PLEASE GIVE [place] A CALL AT [phone].  ? ?THE PATIENT IS ENCOURAGED TO PRACTICE SOCIAL DISTANCING DUE TO THE COVID-19 PANDEMIC.   ?

## 2021-12-06 ENCOUNTER — Other Ambulatory Visit: Payer: Self-pay | Admitting: Internal Medicine

## 2021-12-06 ENCOUNTER — Other Ambulatory Visit: Payer: Self-pay

## 2021-12-06 ENCOUNTER — Ambulatory Visit (INDEPENDENT_AMBULATORY_CARE_PROVIDER_SITE_OTHER): Payer: BC Managed Care – PPO

## 2021-12-06 VITALS — BP 130/76 | HR 91 | Temp 98.3°F

## 2021-12-06 DIAGNOSIS — F988 Other specified behavioral and emotional disorders with onset usually occurring in childhood and adolescence: Secondary | ICD-10-CM

## 2021-12-06 DIAGNOSIS — I10 Essential (primary) hypertension: Secondary | ICD-10-CM

## 2021-12-06 DIAGNOSIS — Z23 Encounter for immunization: Secondary | ICD-10-CM

## 2021-12-06 MED ORDER — NEBIVOLOL HCL 10 MG PO TABS: 10.0000 mg | ORAL_TABLET | Freq: Every day | ORAL | 0 refills | Status: DC

## 2021-12-06 MED ORDER — LISDEXAMFETAMINE DIMESYLATE 40 MG PO CAPS: 40.0000 mg | ORAL_CAPSULE | Freq: Every day | ORAL | 0 refills | Status: DC

## 2021-12-06 NOTE — Patient Instructions (Signed)
Recombinant Zoster (Shingles) Vaccine: What You Need to Know 1. Why get vaccinated? Recombinant zoster (shingles) vaccine can prevent shingles. Shingles (also called herpes zoster, or just zoster) is a painful skin rash, usually with blisters. In addition to the rash, shingles can cause fever, headache, chills, or upset stomach. Rarely, shingles can lead to complications such as pneumonia, hearing problems, blindness, brain inflammation (encephalitis), or death. The risk of shingles increases with age. The most common complication of shingles is long-term nerve pain called postherpetic neuralgia (PHN). PHN occurs in the areas where the shingles rash was and can last for months or years after the rash goes away. The pain from PHN can be severe and debilitating. The risk of PHN increases with age. An older adult with shingles is more likely to develop PHN and have longer lasting and more severe pain than a younger person. People with weakened immune systems also have a higher risk of getting shingles and complications from the disease. Shingles is caused by varicella-zoster virus, the same virus that causes chickenpox. After you have chickenpox, the virus stays in your body and can cause shingles later in life. Shingles cannot be passed from one person to another, but the virus that causes shingles can spread and cause chickenpox in someone who has never had chickenpox or has never received chickenpox vaccine. 2. Recombinant shingles vaccine Recombinant shingles vaccine provides strong protection against shingles. By preventing shingles, recombinant shingles vaccine also protects against PHN and other complications. Recombinant shingles vaccine is recommended for: Adults 68 years and older Adults 19 years and older who have a weakened immune system because of disease or treatments Shingles vaccine is given as a two-dose series. For most people, the second dose should be given 2 to 6 months after the first  dose. Some people who have or will have a weakened immune system can get the second dose 1 to 2 months after the first dose. Ask your health care provider for guidance. People who have had shingles in the past and people who have received varicella (chickenpox) vaccine are recommended to get recombinant shingles vaccine. The vaccine is also recommended for people who have already gotten another type of shingles vaccine, the live shingles vaccine. There is no live virus in recombinant shingles vaccine. Shingles vaccine may be given at the same time as other vaccines. 3. Talk with your health care provider Tell your vaccination provider if the person getting the vaccine: Has had an allergic reaction after a previous dose of recombinant shingles vaccine, or has any severe, life-threatening allergies Is currently experiencing an episode of shingles Is pregnant In some cases, your health care provider may decide to postpone shingles vaccination until a future visit. People with minor illnesses, such as a cold, may be vaccinated. People who are moderately or severely ill should usually wait until they recover before getting recombinant shingles vaccine. Your health care provider can give you more information. 4. Risks of a vaccine reaction A sore arm with mild or moderate pain is very common after recombinant shingles vaccine. Redness and swelling can also happen at the site of the injection. Tiredness, muscle pain, headache, shivering, fever, stomach pain, and nausea are common after recombinant shingles vaccine. These side effects may temporarily prevent a vaccinated person from doing regular activities. Symptoms usually go away on their own in 2 to 3 days. You should still get the second dose of recombinant shingles vaccine even if you had one of these reactions after the first dose. Guillain-Barr  syndrome (GBS), a serious nervous system disorder, has been reported very rarely after recombinant zoster  vaccine. People sometimes faint after medical procedures, including vaccination. Tell your provider if you feel dizzy or have vision changes or ringing in the ears. As with any medicine, there is a very remote chance of a vaccine causing a severe allergic reaction, other serious injury, or death. 5. What if there is a serious problem? An allergic reaction could occur after the vaccinated person leaves the clinic. If you see signs of a severe allergic reaction (hives, swelling of the face and throat, difficulty breathing, a fast heartbeat, dizziness, or weakness), call 9-1-1 and get the person to the nearest hospital. For other signs that concern you, call your health care provider. Adverse reactions should be reported to the Vaccine Adverse Event Reporting System (VAERS). Your health care provider will usually file this report, or you can do it yourself. Visit the VAERS website at www.vaers.hhs.gov or call 1-800-822-7967. VAERS is only for reporting reactions, and VAERS staff members do not give medical advice. 6. How can I learn more? Ask your health care provider. Call your local or state health department. Visit the website of the Food and Drug Administration (FDA) for vaccine package inserts and additional information at www.fda.gov/vaccinesblood-biologics/vaccines. Contact the Centers for Disease Control and Prevention (CDC): Call 1-800-232-4636 (1-800-CDC-INFO) or Visit CDC's website at www.cdc.gov/vaccines. Source: CDC Vaccine Information Statement Recombinant Zoster Vaccine (07/30/2020) This same material is available at www.cdc.gov for no charge. This information is not intended to replace advice given to you by your health care provider. Make sure you discuss any questions you have with your health care provider. Document Revised: 05/11/2021 Document Reviewed: 08/13/2020 Elsevier Patient Education  2023 Elsevier Inc.  

## 2021-12-06 NOTE — Progress Notes (Signed)
Pt here today for second shingrix.

## 2021-12-07 IMAGING — MG MM DIGITAL SCREENING BILAT W/ TOMO AND CAD
6 of 12 series · 6 of 36 positions shown · non-contrast
Comparison: Previous exam(s).

CLINICAL DATA: Screening.

EXAM:
DIGITAL SCREENING BILATERAL MAMMOGRAM WITH TOMOSYNTHESIS AND CAD
TECHNIQUE: Bilateral screening digital craniocaudal and mediolateral oblique
mammograms were obtained. Bilateral screening digital breast
tomosynthesis was performed. The images were evaluated with
computer-aided detection.

[R MLO synth-2D (1 of 2)]
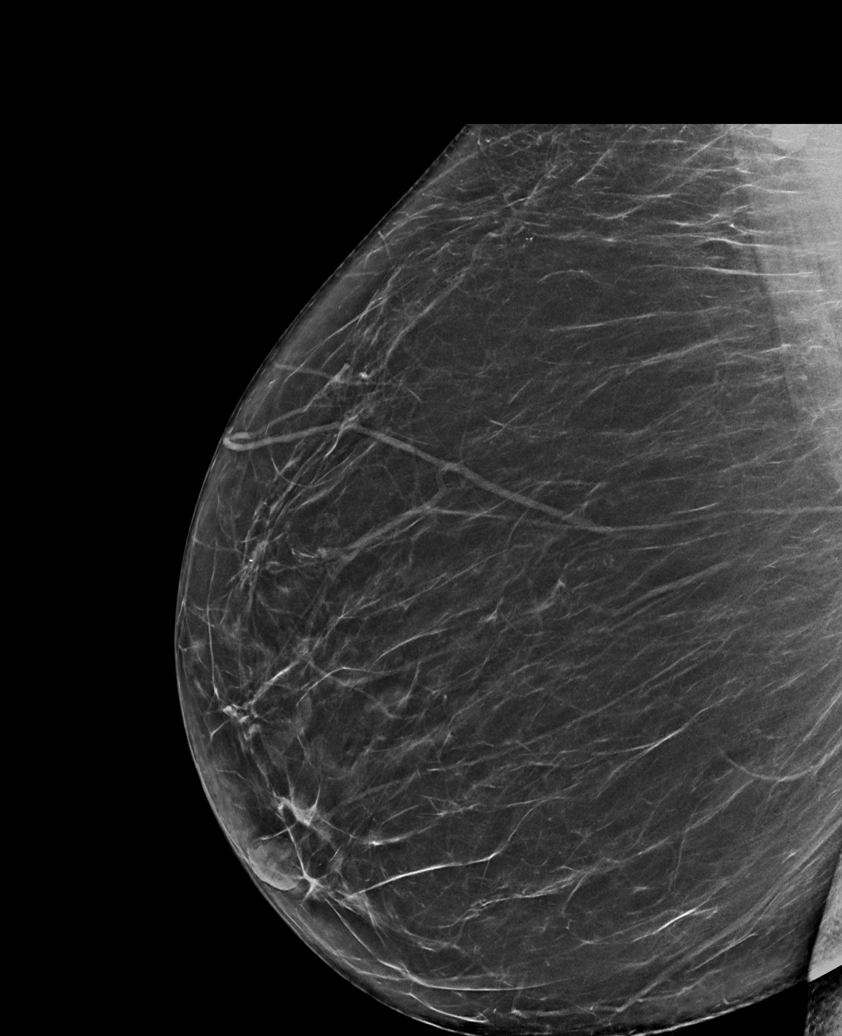

[L MLO synth-2D]
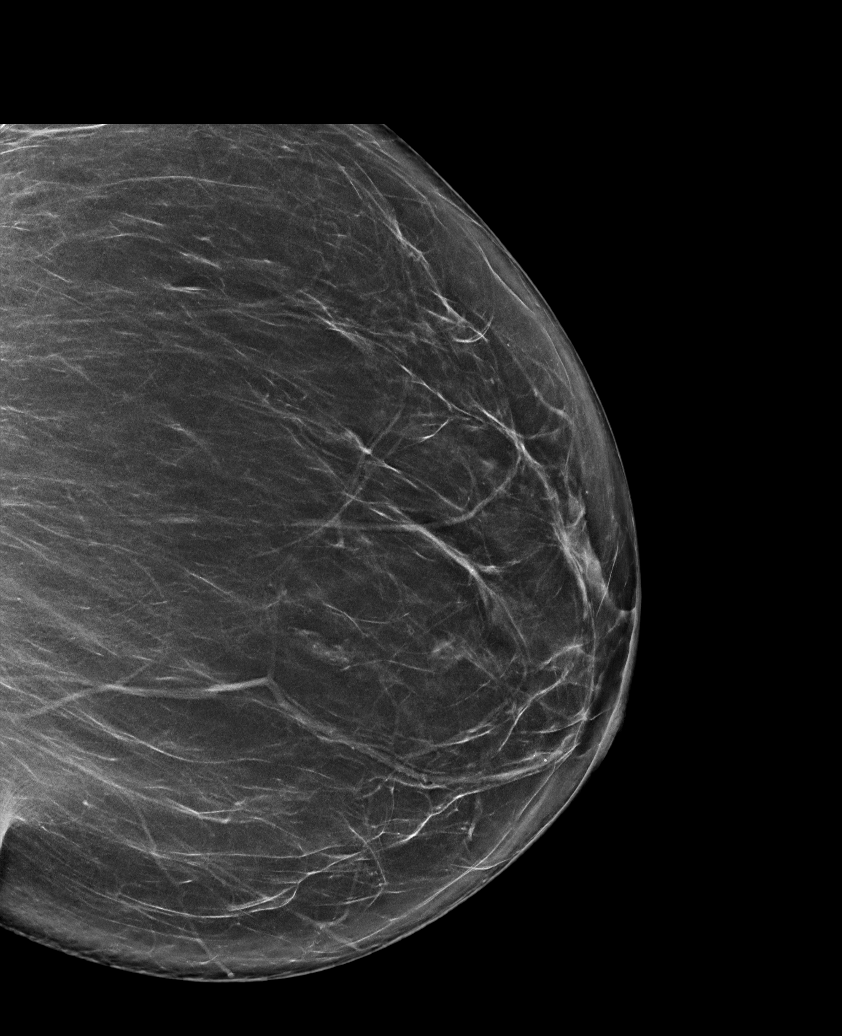

[R CC synth-2D]
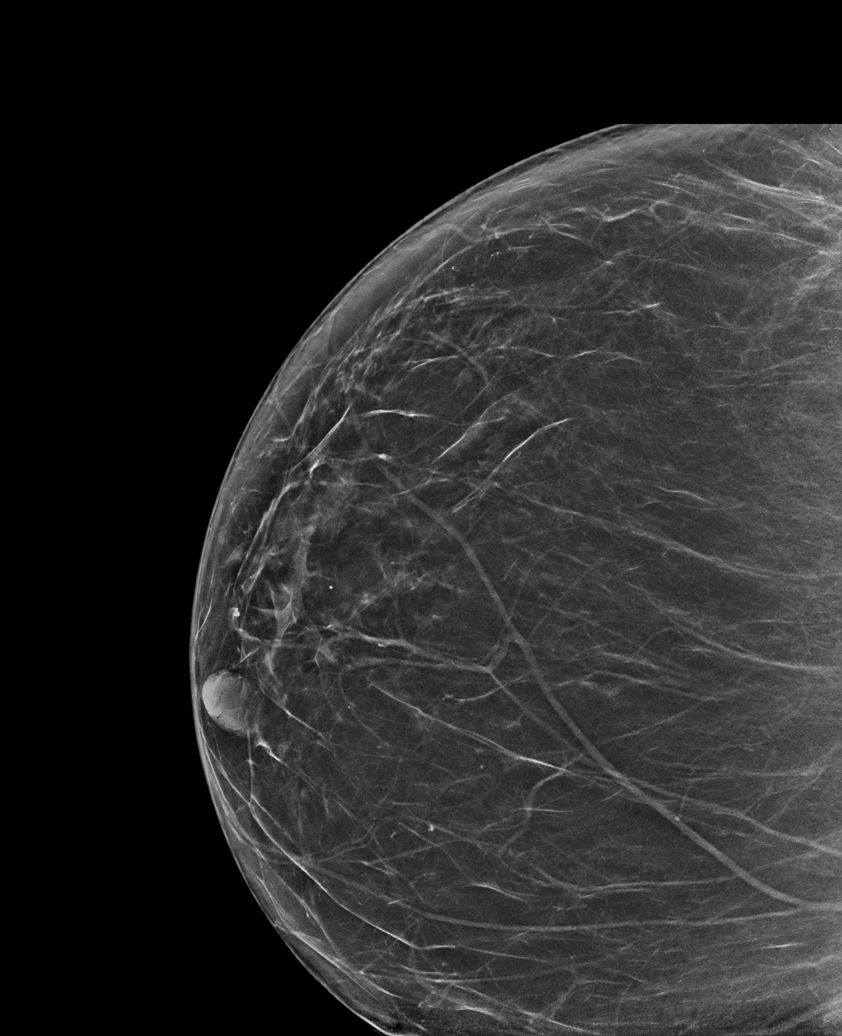

[R MLO synth-2D (2 of 2)]
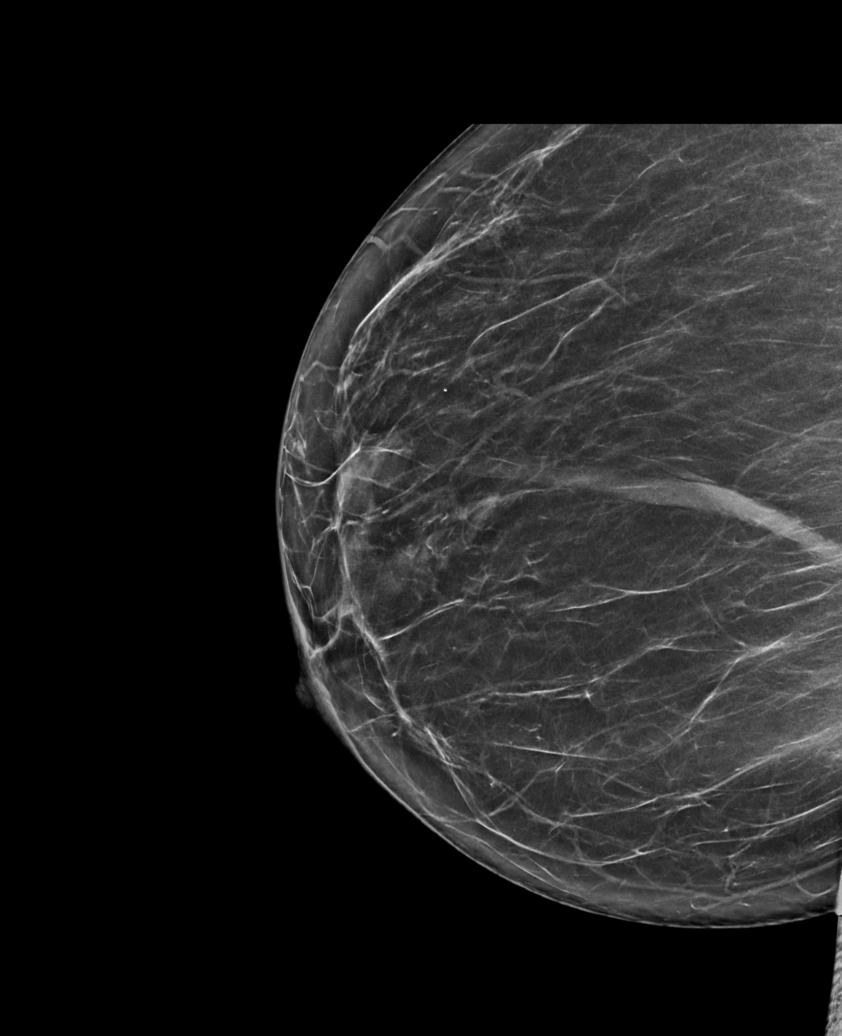

[R CV synth-2D]
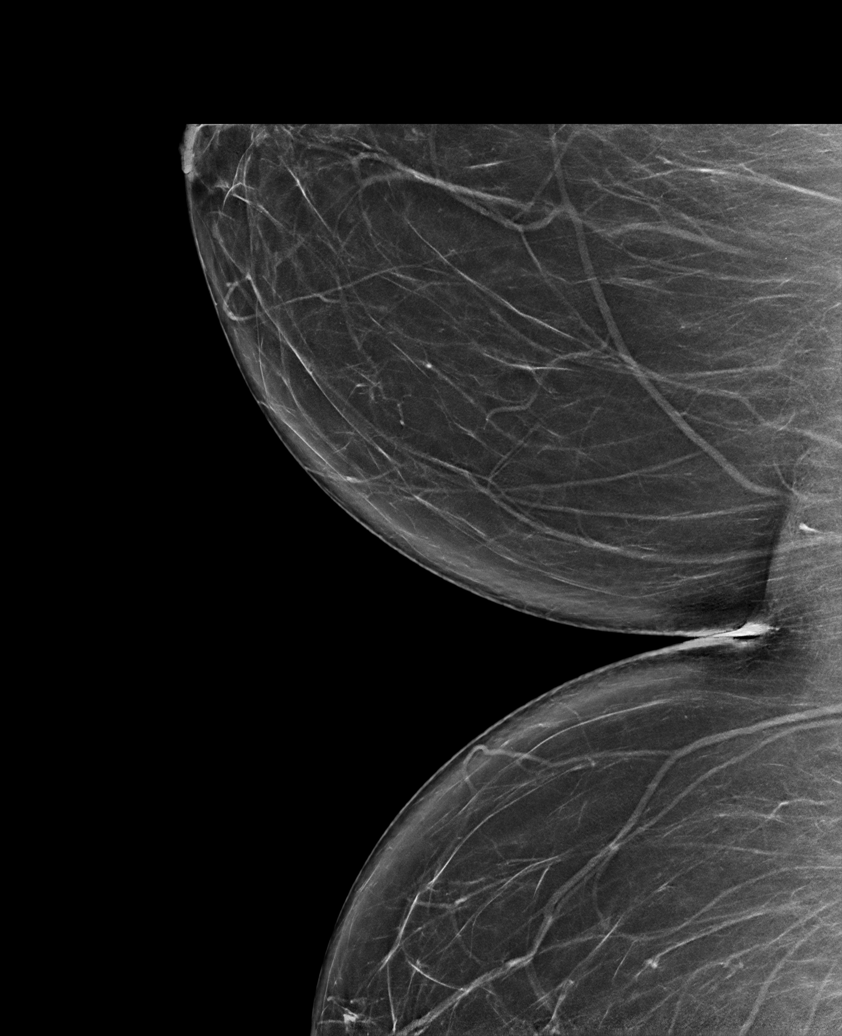

[L CC synth-2D]
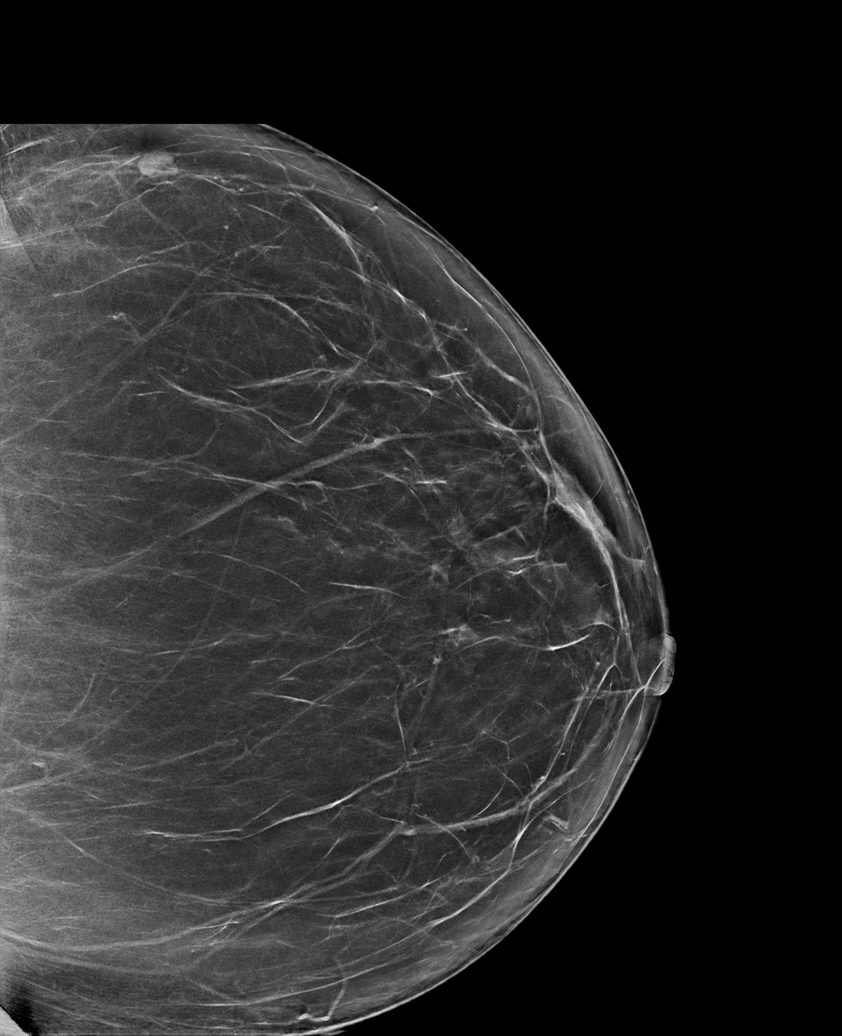

[6 of 36 positions shown; findings below may reference images not displayed]

ACR Breast Density Category b: There are scattered areas of
fibroglandular density.
FINDINGS: There are no findings suspicious for malignancy. The images were
evaluated with computer-aided detection.
IMPRESSION: No mammographic evidence of malignancy. A result letter of this
screening mammogram will be mailed directly to the patient.

RECOMMENDATION:
Screening mammogram in one year. (Code:[id number])

BI-RADS CATEGORY  1: Negative.

## 2021-12-26 ENCOUNTER — Encounter: Payer: Self-pay | Admitting: Internal Medicine

## 2021-12-26 ENCOUNTER — Ambulatory Visit: Payer: BC Managed Care – PPO | Admitting: Internal Medicine

## 2021-12-26 VITALS — BP 124/76 | HR 65 | Temp 98.2°F | Ht 63.0 in | Wt 276.0 lb

## 2021-12-26 DIAGNOSIS — I10 Essential (primary) hypertension: Secondary | ICD-10-CM | POA: Diagnosis not present

## 2021-12-26 DIAGNOSIS — F4321 Adjustment disorder with depressed mood: Secondary | ICD-10-CM

## 2021-12-26 DIAGNOSIS — F988 Other specified behavioral and emotional disorders with onset usually occurring in childhood and adolescence: Secondary | ICD-10-CM | POA: Diagnosis not present

## 2021-12-26 DIAGNOSIS — Z6841 Body Mass Index (BMI) 40.0 and over, adult: Secondary | ICD-10-CM

## 2021-12-26 NOTE — Patient Instructions (Signed)
Hypertension, Adult ?Hypertension is another name for high blood pressure. High blood pressure forces your heart to work harder to pump blood. This can cause problems over time. ?There are two numbers in a blood pressure reading. There is a top number (systolic) over a bottom number (diastolic). It is best to have a blood pressure that is below 120/80. ?What are the causes? ?The cause of this condition is not known. Some other conditions can lead to high blood pressure. ?What increases the risk? ?Some lifestyle factors can make you more likely to develop high blood pressure: ?Smoking. ?Not getting enough exercise or physical activity. ?Being overweight. ?Having too much fat, sugar, calories, or salt (sodium) in your diet. ?Drinking too much alcohol. ?Other risk factors include: ?Having any of these conditions: ?Heart disease. ?Diabetes. ?High cholesterol. ?Kidney disease. ?Obstructive sleep apnea. ?Having a family history of high blood pressure and high cholesterol. ?Age. The risk increases with age. ?Stress. ?What are the signs or symptoms? ?High blood pressure may not cause symptoms. Very high blood pressure (hypertensive crisis) may cause: ?Headache. ?Fast or uneven heartbeats (palpitations). ?Shortness of breath. ?Nosebleed. ?Vomiting or feeling like you may vomit (nauseous). ?Changes in how you see. ?Very bad chest pain. ?Feeling dizzy. ?Seizures. ?How is this treated? ?This condition is treated by making healthy lifestyle changes, such as: ?Eating healthy foods. ?Exercising more. ?Drinking less alcohol. ?Your doctor may prescribe medicine if lifestyle changes do not help enough and if: ?Your top number is above 130. ?Your bottom number is above 80. ?Your personal target blood pressure may vary. ?Follow these instructions at home: ?Eating and drinking ? ?If told, follow the DASH eating plan. To follow this plan: ?Fill one half of your plate at each meal with fruits and vegetables. ?Fill one fourth of your plate  at each meal with whole grains. Whole grains include whole-wheat pasta, brown rice, and whole-grain bread. ?Eat or drink low-fat dairy products, such as skim milk or low-fat yogurt. ?Fill one fourth of your plate at each meal with low-fat (lean) proteins. Low-fat proteins include fish, chicken without skin, eggs, beans, and tofu. ?Avoid fatty meat, cured and processed meat, or chicken with skin. ?Avoid pre-made or processed food. ?Limit the amount of salt in your diet to less than 1,500 mg each day. ?Do not drink alcohol if: ?Your doctor tells you not to drink. ?You are pregnant, may be pregnant, or are planning to become pregnant. ?If you drink alcohol: ?Limit how much you have to: ?0-1 drink a day for women. ?0-2 drinks a day for men. ?Know how much alcohol is in your drink. In the [place] one drink equals one 12 oz bottle of beer (355 mL), one 5 oz glass of wine (148 mL), or one 1? oz glass of hard liquor (44 mL). ?Lifestyle ? ?Work with your doctor to stay at a healthy weight or to lose weight. Ask your doctor what the best weight is for you. ?Get at least 30 minutes of exercise that causes your heart to beat faster (aerobic exercise) most days of the week. This may include walking, swimming, or biking. ?Get at least 30 minutes of exercise that strengthens your muscles (resistance exercise) at least 3 days a week. This may include lifting weights or doing Pilates. ?Do not smoke or use any products that contain nicotine or tobacco. If you need help quitting, ask your doctor. ?Check your blood pressure at home as told by your doctor. ?Keep all follow-up visits. ?Medicines ?Take over-the-counter and prescription medicines   only as told by your doctor. Follow directions carefully. ?Do not skip doses of blood pressure medicine. The medicine does not work as well if you skip doses. Skipping doses also puts you at risk for problems. ?Ask your doctor about side effects or reactions to medicines that you should watch  for. ?Contact a doctor if: ?You think you are having a reaction to the medicine you are taking. ?You have headaches that keep coming back. ?You feel dizzy. ?You have swelling in your ankles. ?You have trouble with your vision. ?Get help right away if: ?You get a very bad headache. ?You start to feel mixed up (confused). ?You feel weak or numb. ?You feel faint. ?You have very bad pain in your: ?Chest. ?Belly (abdomen). ?You vomit more than once. ?You have trouble breathing. ?These symptoms may be an emergency. Get help right away. Call 911. ?Do not wait to see if the symptoms will go away. ?Do not drive yourself to the hospital. ?Summary ?Hypertension is another name for high blood pressure. ?High blood pressure forces your heart to work harder to pump blood. ?For most people, a normal blood pressure is less than 120/80. ?Making healthy choices can help lower blood pressure. If your blood pressure does not get lower with healthy choices, you may need to take medicine. ?This information is not intended to replace advice given to you by your health care provider. Make sure you discuss any questions you have with your health care provider. ?Document Revised: 03/31/2021 Document Reviewed: 03/31/2021 ?Elsevier Patient Education ? 2023 Elsevier Inc. ? ?

## 2021-12-26 NOTE — Progress Notes (Signed)
Rich Brave Llittleton,acting as a Education administrator for Maximino Greenland, MD.,have documented all relevant documentation on the behalf of Maximino Greenland, MD,as directed by  Maximino Greenland, MD while in the presence of Maximino Greenland, MD.  This visit occurred during the SARS-CoV-2 public health emergency.  Safety protocols were in place, including screening questions prior to the visit, additional usage of staff PPE, and extensive cleaning of exam room while observing appropriate contact time as indicated for disinfecting solutions.  Subjective:     Patient ID: Brooke [name] , female    DOB: September 17, 1970 , 51 y.o.   MRN: [id number]   Chief Complaint  Patient presents with   Hypertension    HPI  Patient presents today for a bp check. She reports compliance with meds. Denies headaches, chest pain and shortness of breath. Admits she has not been exercising recently. Unfortunately, her sister recently passed with metastatic breast cancer at the age of 61.  She has no other immediate family members that have been diagnosed with breast cancer.   Hypertension This is a chronic problem. The current episode started more than 1 year ago. The problem has been gradually improving since onset. The problem is controlled. Pertinent negatives include no blurred vision, chest pain, palpitations or shortness of breath. Risk factors for coronary artery disease include obesity. Past treatments include diuretics. The current treatment provides mild improvement. Compliance problems include exercise.      Past Medical History:  Diagnosis Date   ADD (attention deficit disorder)    Allergy    allergic rhinitis   Anemia    nos   Anxiety    Arthritis    right ankle   Asthma    as a child   Back pain    Constipation    Depression    Elevated blood pressure reading without diagnosis of hypertension    Genital herpes    Lactose intolerance    Migraine    Prediabetes    Sleep apnea    Stress    Swelling       Family History  Problem Relation Age of Onset   Hypertension Mother    Diabetes Mother    Hyperlipidemia Mother    Hypertension Father    Sleep apnea Father    Alcoholism Father    Obesity Father    Breast cancer Sister    Heart failure Maternal Grandmother    Heart failure Maternal Grandfather    Diabetes Maternal Aunt    Diabetes Maternal Uncle    Heart failure Paternal Aunt    Diabetes Paternal Aunt    Hypertension Other      Current Outpatient Medications:    lisdexamfetamine (VYVANSE) 40 MG capsule, Take 1 capsule (40 mg total) by mouth daily., Disp: 30 capsule, Rfl: 0   Multiple Vitamin (MULTIVITAMIN) tablet, Take 1 tablet by mouth daily., Disp: , Rfl:    nebivolol (BYSTOLIC) 10 MG tablet, Take 1 tablet (10 mg total) by mouth daily., Disp: 90 tablet, Rfl: 0   sertraline (ZOLOFT) 100 MG tablet, TAKE 1 TABLET(100 MG) BY MOUTH DAILY, Disp: 90 tablet, Rfl: 1   valACYclovir (VALTREX) 500 MG tablet, Take 1 tablet (500 mg total) by mouth as needed (genital herpes)., Disp: 90 tablet, Rfl: 0   valsartan (DIOVAN) 160 MG tablet, TAKE 1 TABLET(160 MG) BY MOUTH DAILY, Disp: 30 tablet, Rfl: 11   Vitamin D, Cholecalciferol, 25 MCG (1000 UT) TABS, Take 5,000 Units by mouth daily. , Disp: , Rfl:  No Known Allergies   Review of Systems  Constitutional: Negative.   Eyes: Negative.  Negative for blurred vision.  Respiratory: Negative.  Negative for shortness of breath.   Cardiovascular: Negative.  Negative for chest pain and palpitations.  Gastrointestinal: Negative.   Musculoskeletal: Negative.   Skin: Negative.   Neurological: Negative.   Psychiatric/Behavioral: Negative.       Today's Vitals   12/26/21 1146  BP: 124/76  Pulse: 65  Temp: 98.2 F (36.8 C)  Weight: 276 lb (125.2 kg)  Height: '5\' 3"'$  (1.6 m)  PainSc: 0-No pain   Body mass index is 48.89 kg/m.  Wt Readings from Last 3 Encounters:  12/26/21 276 lb (125.2 kg)  10/25/21 275 lb 12.8 oz (125.1 kg)  08/22/21  269 lb 6.4 oz (122.2 kg)     Objective:  Physical Exam Vitals and nursing note reviewed.  Constitutional:      Appearance: Normal appearance. She is obese.  HENT:     Head: Normocephalic and atraumatic.  Eyes:     Extraocular Movements: Extraocular movements intact.  Cardiovascular:     Rate and Rhythm: Normal rate and regular rhythm.     Heart sounds: Normal heart sounds.  Pulmonary:     Effort: Pulmonary effort is normal.     Breath sounds: Normal breath sounds.  Musculoskeletal:     Cervical back: Normal range of motion.  Skin:    General: Skin is warm.  Neurological:     General: No focal deficit present.     Mental Status: She is alert.  Psychiatric:        Mood and Affect: Mood normal.        Behavior: Behavior normal.      Assessment And Plan:     1. Essential hypertension Comments: Chronic, well controlled. She is encouraged to follow low sodium diet.  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 Last refilled 12/06/21.  She will f/u in 4 months.   3. Grief Comments: I will refer her to Hospice for grief counseling.  - Ambulatory referral to Hospice  4. Class 3 severe obesity due to excess calories with serious comorbidity and body mass index (BMI) of 45.0 to 49.9 in adult [facility]) Comments: BMI 48. She is encouraged to increase daily activity as tolerated.  She agrees to [employer] referral.  - Amb Referral To Provider Referral Exercise Program (P.R.E.P)   Patient was given opportunity to ask questions. Patient verbalized understanding of the plan and was able to repeat key elements of the plan. All questions were answered to their satisfaction.   I, Maximino Greenland, MD, have reviewed all documentation for this visit. The documentation on 12/26/21 for the exam, diagnosis, procedures, and orders are all accurate and complete.   IF YOU HAVE BEEN  REFERRED TO A SPECIALIST, IT MAY TAKE 1-2 WEEKS TO SCHEDULE/PROCESS THE REFERRAL. IF YOU HAVE NOT HEARD FROM US/SPECIALIST IN TWO WEEKS, PLEASE GIVE [place] A CALL AT [phone].   THE PATIENT IS ENCOURAGED TO PRACTICE SOCIAL DISTANCING DUE TO THE COVID-19 PANDEMIC.

## 2021-12-31 ENCOUNTER — Telehealth: Payer: Self-pay

## 2021-12-31 NOTE — Telephone Encounter (Signed)
Attempted to reach pt reference PREP referral Unable to leave message-mailbox full Will reattempt again at later date

## 2022-01-05 ENCOUNTER — Other Ambulatory Visit: Payer: Self-pay | Admitting: Internal Medicine

## 2022-01-05 DIAGNOSIS — F988 Other specified behavioral and emotional disorders with onset usually occurring in childhood and adolescence: Secondary | ICD-10-CM

## 2022-01-06 MED ORDER — LISDEXAMFETAMINE DIMESYLATE 40 MG PO CAPS: 40.0000 mg | ORAL_CAPSULE | Freq: Every day | ORAL | 0 refills | Status: DC

## 2022-01-12 ENCOUNTER — Other Ambulatory Visit (HOSPITAL_COMMUNITY): Payer: Self-pay

## 2022-01-13 ENCOUNTER — Other Ambulatory Visit: Payer: Self-pay | Admitting: Internal Medicine

## 2022-01-13 DIAGNOSIS — Z1231 Encounter for screening mammogram for malignant neoplasm of breast: Secondary | ICD-10-CM

## 2022-01-23 ENCOUNTER — Ambulatory Visit
Admission: RE | Admit: 2022-01-23 | Discharge: 2022-01-23 | Disposition: A | Discharge status: Home / Self Care | Payer: BC Managed Care – PPO | Source: Ambulatory Visit | Attending: Internal Medicine | Admitting: Internal Medicine

## 2022-01-23 DIAGNOSIS — Z1231 Encounter for screening mammogram for malignant neoplasm of breast: Secondary | ICD-10-CM

## 2022-01-25 ENCOUNTER — Other Ambulatory Visit: Payer: Self-pay | Admitting: Internal Medicine

## 2022-01-25 DIAGNOSIS — R928 Other abnormal and inconclusive findings on diagnostic imaging of breast: Secondary | ICD-10-CM

## 2022-01-30 ENCOUNTER — Ambulatory Visit
Admission: RE | Admit: 2022-01-30 | Discharge: 2022-01-30 | Disposition: A | Discharge status: Home / Self Care | Payer: BC Managed Care – PPO | Source: Ambulatory Visit | Attending: Internal Medicine | Admitting: Internal Medicine

## 2022-01-30 ENCOUNTER — Ambulatory Visit: Payer: BC Managed Care – PPO

## 2022-01-30 DIAGNOSIS — R928 Other abnormal and inconclusive findings on diagnostic imaging of breast: Secondary | ICD-10-CM

## 2022-02-01 ENCOUNTER — Encounter (INDEPENDENT_AMBULATORY_CARE_PROVIDER_SITE_OTHER): Payer: Self-pay

## 2022-02-20 ENCOUNTER — Other Ambulatory Visit: Payer: Self-pay | Admitting: Internal Medicine

## 2022-02-20 DIAGNOSIS — F988 Other specified behavioral and emotional disorders with onset usually occurring in childhood and adolescence: Secondary | ICD-10-CM

## 2022-02-21 MED ORDER — LISDEXAMFETAMINE DIMESYLATE 40 MG PO CAPS: 40.0000 mg | ORAL_CAPSULE | Freq: Every day | ORAL | 0 refills | Status: DC

## 2022-04-09 ENCOUNTER — Encounter: Payer: Self-pay | Admitting: Internal Medicine

## 2022-04-10 ENCOUNTER — Other Ambulatory Visit: Payer: Self-pay

## 2022-04-10 MED ORDER — VALSARTAN 160 MG PO TABS: ORAL_TABLET | ORAL | 11 refills | Status: DC

## 2022-04-12 ENCOUNTER — Encounter: Payer: Self-pay | Admitting: Internal Medicine

## 2022-04-12 ENCOUNTER — Other Ambulatory Visit: Payer: Self-pay | Admitting: Internal Medicine

## 2022-04-12 DIAGNOSIS — F331 Major depressive disorder, recurrent, moderate: Secondary | ICD-10-CM

## 2022-04-12 DIAGNOSIS — F988 Other specified behavioral and emotional disorders with onset usually occurring in childhood and adolescence: Secondary | ICD-10-CM

## 2022-04-12 MED ORDER — LISDEXAMFETAMINE DIMESYLATE 40 MG PO CAPS: 40.0000 mg | ORAL_CAPSULE | Freq: Every day | ORAL | 0 refills | Status: DC

## 2022-04-12 MED ORDER — SERTRALINE HCL 100 MG PO TABS: ORAL_TABLET | ORAL | 1 refills | Status: DC

## 2022-05-10 ENCOUNTER — Encounter: Payer: BC Managed Care – PPO | Admitting: Internal Medicine

## 2022-05-19 ENCOUNTER — Other Ambulatory Visit: Payer: Self-pay | Admitting: Internal Medicine

## 2022-05-19 DIAGNOSIS — I10 Essential (primary) hypertension: Secondary | ICD-10-CM

## 2022-06-01 ENCOUNTER — Other Ambulatory Visit: Payer: Self-pay | Admitting: Internal Medicine

## 2022-06-01 DIAGNOSIS — F988 Other specified behavioral and emotional disorders with onset usually occurring in childhood and adolescence: Secondary | ICD-10-CM

## 2022-06-02 MED ORDER — LISDEXAMFETAMINE DIMESYLATE 40 MG PO CAPS: 40.0000 mg | ORAL_CAPSULE | Freq: Every day | ORAL | 0 refills | Status: DC

## 2022-06-13 ENCOUNTER — Encounter: Payer: Self-pay | Admitting: Internal Medicine

## 2022-06-13 ENCOUNTER — Other Ambulatory Visit (HOSPITAL_COMMUNITY): Payer: Self-pay

## 2022-06-13 ENCOUNTER — Ambulatory Visit (INDEPENDENT_AMBULATORY_CARE_PROVIDER_SITE_OTHER): Payer: BC Managed Care – PPO | Admitting: Internal Medicine

## 2022-06-13 VITALS — BP 132/80 | HR 67 | Temp 98.4°F | Ht 63.4 in | Wt 293.0 lb

## 2022-06-13 DIAGNOSIS — Z23 Encounter for immunization: Secondary | ICD-10-CM

## 2022-06-13 DIAGNOSIS — Z0000 Encounter for general adult medical examination without abnormal findings: Secondary | ICD-10-CM

## 2022-06-13 DIAGNOSIS — R911 Solitary pulmonary nodule: Secondary | ICD-10-CM | POA: Diagnosis not present

## 2022-06-13 DIAGNOSIS — F331 Major depressive disorder, recurrent, moderate: Secondary | ICD-10-CM

## 2022-06-13 DIAGNOSIS — F988 Other specified behavioral and emotional disorders with onset usually occurring in childhood and adolescence: Secondary | ICD-10-CM | POA: Diagnosis not present

## 2022-06-13 DIAGNOSIS — I10 Essential (primary) hypertension: Secondary | ICD-10-CM

## 2022-06-13 DIAGNOSIS — Z6841 Body Mass Index (BMI) 40.0 and over, adult: Secondary | ICD-10-CM

## 2022-06-13 LAB — POCT URINALYSIS DIPSTICK
Bilirubin, UA: NEGATIVE
Blood, UA: NEGATIVE
Glucose, UA: NEGATIVE
Ketones, UA: 15
Leukocytes, UA: NEGATIVE
Nitrite, UA: NEGATIVE
Protein, UA: POSITIVE — AB
Spec Grav, UA: >=1.03 — AB (ref 1.010–1.025)
Urobilinogen, UA: 0.2 E.U./dL
pH, UA: 5.5 (ref 5.0–8.0)

## 2022-06-13 MED ORDER — LISDEXAMFETAMINE DIMESYLATE 40 MG PO CAPS
40.0000 mg | ORAL_CAPSULE | Freq: Every day | ORAL | 0 refills | Status: DC
  Filled 2022-06-13 – 2022-06-23 (×3): qty 30, 30d supply, fill #0

## 2022-06-13 NOTE — Progress Notes (Signed)
Rich Brave Llittleton,acting as a Education administrator for Maximino Greenland, MD.,have documented all relevant documentation on the behalf of Maximino Greenland, MD,as directed by  Maximino Greenland, MD while in the presence of Maximino Greenland, MD.   Subjective:     Patient ID: ARLANDA SHIPLETT , female    DOB: 1970/11/06 , 51 y.o.   MRN: [id number]   Chief Complaint  Patient presents with  . Annual Exam    HPI  The patient is here today for a physical examination. She is followed by Dr. Delora Fuel at [facility] for her GYN care.  Patient reports having headaches for the past 3 nights. She admits non-compliance with CPAP. She woke up on Sunday morning with a very bad headache that almost caused her to call EMS. She reports she used her cpap and her headache was better.  Hypertension This is a chronic problem. The current episode started more than 1 year ago. The problem has been gradually improving since onset. The problem is controlled. Associated symptoms include headaches. Pertinent negatives include no blurred vision, chest pain, palpitations or shortness of breath.     Past Medical History:  Diagnosis Date  . ADD (attention deficit disorder)   . Allergy    allergic rhinitis  . Anemia    nos  . Anxiety   . Arthritis    right ankle  . Asthma    as a child  . Back pain   . Constipation   . Depression   . Elevated blood pressure reading without diagnosis of hypertension   . Genital herpes   . Lactose intolerance   . Migraine   . Prediabetes   . Sleep apnea   . Stress   . Swelling      Family History  Problem Relation Age of Onset  . Hypertension Mother   . Diabetes Mother   . Hyperlipidemia Mother   . Hypertension Father   . Sleep apnea Father   . Alcoholism Father   . Obesity Father   . Breast cancer Sister   . Heart failure Maternal Grandmother   . Heart failure Maternal Grandfather   . Diabetes Maternal Aunt   . Diabetes Maternal Uncle   . Heart failure Paternal Aunt   . Diabetes  Paternal Aunt   . Hypertension Other      Current Outpatient Medications:  Marland Kitchen  Multiple Vitamin (MULTIVITAMIN) tablet, Take 1 tablet by mouth daily., Disp: , Rfl:  .  nebivolol (BYSTOLIC) 10 MG tablet, TAKE 1 TABLET(10 MG) BY MOUTH DAILY, Disp: 90 tablet, Rfl: 0 .  sertraline (ZOLOFT) 100 MG tablet, TAKE 1 TABLET(100 MG) BY MOUTH DAILY, Disp: 90 tablet, Rfl: 1 .  valACYclovir (VALTREX) 500 MG tablet, Take 1 tablet (500 mg total) by mouth as needed (genital herpes)., Disp: 90 tablet, Rfl: 0 .  valsartan (DIOVAN) 160 MG tablet, TAKE 1 TABLET(160 MG) BY MOUTH DAILY, Disp: 30 tablet, Rfl: 11 .  lisdexamfetamine (VYVANSE) 40 MG capsule, Take 1 capsule (40 mg total) by mouth daily. (Patient not taking: Reported on 06/13/2022), Disp: 30 capsule, Rfl: 0 .  Vitamin D, Cholecalciferol, 25 MCG (1000 UT) TABS, Take 5,000 Units by mouth daily. , Disp: , Rfl:    No Known Allergies    The patient states she uses {contraceptive methods:5051} for birth control. Last LMP was No LMP recorded. (Menstrual status: IUD).. {Dysmenorrhea-menorrhagia:21918}. Negative for: breast discharge, breast lump(s), breast pain and breast self exam. Associated symptoms include abnormal vaginal bleeding. Pertinent negatives  include abnormal bleeding (hematology), anxiety, decreased libido, depression, difficulty falling sleep, dyspareunia, history of infertility, nocturia, sexual dysfunction, sleep disturbances, urinary incontinence, urinary urgency, vaginal discharge and vaginal itching. Diet regular.The patient states her exercise level is    . The patient's tobacco use is:  Social History   Tobacco Use  Smoking Status Never  Smokeless Tobacco Never  . She has been exposed to passive smoke. The patient's alcohol use is:  Social History   Substance and Sexual Activity  Alcohol Use No   Comment: OCC  . Additional information: Last pap ***, next one scheduled for ***.    Review of Systems  Constitutional: Negative.    HENT: Negative.    Eyes: Negative.  Negative for blurred vision.  Respiratory: Negative.  Negative for shortness of breath.   Cardiovascular: Negative.  Negative for chest pain and palpitations.  Endocrine: Negative.   Genitourinary: Negative.   Musculoskeletal: Negative.   Skin: Negative.   Allergic/Immunologic: Negative.   Neurological:  Positive for headaches.  Hematological: Negative.   Psychiatric/Behavioral: Negative.       Today's Vitals   06/13/22 1414  BP: 132/80  Pulse: 67  Temp: 98.4 F (36.9 C)  Weight: 293 lb (132.9 kg)  Height: 5' 3.4" (1.61 m)  PainSc: 0-No pain   Body mass index is 51.25 kg/m.  Wt Readings from Last 3 Encounters:  06/13/22 293 lb (132.9 kg)  12/26/21 276 lb (125.2 kg)  10/25/21 275 lb 12.8 oz (125.1 kg)     Objective:  Physical Exam Vitals and nursing note reviewed.  Constitutional:      Appearance: Normal appearance.  HENT:     Head: Normocephalic and atraumatic.     Right Ear: Tympanic membrane, ear canal and external ear normal.     Left Ear: Tympanic membrane, ear canal and external ear normal.     Nose:     Comments: Masked     Mouth/Throat:     Comments: Masked  Eyes:     Extraocular Movements: Extraocular movements intact.     Conjunctiva/sclera: Conjunctivae normal.     Pupils: Pupils are equal, round, and reactive to light.  Cardiovascular:     Rate and Rhythm: Normal rate and regular rhythm.     Pulses: Normal pulses.     Heart sounds: Normal heart sounds.  Pulmonary:     Effort: Pulmonary effort is normal.     Breath sounds: Normal breath sounds.  Abdominal:     General: Bowel sounds are normal.     Palpations: Abdomen is soft.  Genitourinary:    Comments: deferred Musculoskeletal:        General: Normal range of motion.     Cervical back: Normal range of motion and neck supple.  Skin:    General: Skin is warm and dry.  Neurological:     General: No focal deficit present.     Mental Status: She is alert  and oriented to person, place, and time.  Psychiatric:        Mood and Affect: Mood normal.        Behavior: Behavior normal.    Assessment And Plan:     1. Encounter for general adult medical examination w/o abnormal findings  2. Essential hypertension - POCT Urinalysis Dipstick (81002) - Microalbumin / Creatinine Urine Ratio - EKG 12-Lead  3. Mixed hyperlipidemia  4. Prediabetes  5. Class 3 severe obesity due to excess calories with body mass index (BMI) of 50.0 to 59.9 in  adult, unspecified whether serious comorbidity present ([place])  6. Immunization due - Flu Vaccine QUAD 6+ mos PF IM (Fluarix Quad PF)  Patient was given opportunity to ask questions. Patient verbalized understanding of the plan and was able to repeat key elements of the plan. All questions were answered to their satisfaction.   I, Maximino Greenland, MD, have reviewed all documentation for this visit. The documentation on 06/13/22 for the exam, diagnosis, procedures, and orders are all accurate and complete.  THE PATIENT IS ENCOURAGED TO PRACTICE SOCIAL DISTANCING DUE TO THE COVID-19 PANDEMIC.

## 2022-06-14 ENCOUNTER — Encounter: Payer: Self-pay | Admitting: Internal Medicine

## 2022-06-14 DIAGNOSIS — R911 Solitary pulmonary nodule: Secondary | ICD-10-CM | POA: Insufficient documentation

## 2022-06-14 LAB — MICROALBUMIN / CREATININE URINE RATIO
Creatinine, Urine: 437.2 mg/dL
Microalb/Creat Ratio: 5 mg/g creat (ref 0–29)
Microalbumin, Urine: 20 ug/mL

## 2022-06-14 LAB — CMP14+EGFR
ALT: 23 IU/L (ref 0–32)
AST: 24 IU/L (ref 0–40)
Albumin/Globulin Ratio: 1.3 (ref 1.2–2.2)
Albumin: 4.3 g/dL (ref 3.8–4.9)
Alkaline Phosphatase: 123 IU/L — ABNORMAL HIGH (ref 44–121)
BUN/Creatinine Ratio: 16 (ref 9–23)
BUN: 15 mg/dL (ref 6–24)
Bilirubin Total: <0.2 mg/dL (ref 0.0–1.2)
CO2: 25 mmol/L (ref 20–29)
Calcium: 9.7 mg/dL (ref 8.7–10.2)
Chloride: 101 mmol/L (ref 96–106)
Creatinine, Ser: 0.96 mg/dL (ref 0.57–1.00)
Globulin, Total: 3.4 g/dL (ref 1.5–4.5)
Glucose: 84 mg/dL (ref 70–99)
Potassium: 4.2 mmol/L (ref 3.5–5.2)
Sodium: 142 mmol/L (ref 134–144)
Total Protein: 7.7 g/dL (ref 6.0–8.5)
eGFR: 72 mL/min/{1.73_m2} (ref >59)

## 2022-06-14 LAB — LIPID PANEL
Chol/HDL Ratio: 3.4 ratio (ref 0.0–4.4)
Cholesterol, Total: 234 mg/dL — ABNORMAL HIGH (ref 100–199)
HDL: 69 mg/dL (ref >39)
LDL Chol Calc (NIH): 139 mg/dL — ABNORMAL HIGH (ref 0–99)
Triglycerides: 148 mg/dL (ref 0–149)
VLDL Cholesterol Cal: 26 mg/dL (ref 5–40)

## 2022-06-14 LAB — CBC
Hematocrit: 38.3 % (ref 34.0–46.6)
Hemoglobin: 12.1 g/dL (ref 11.1–15.9)
MCH: 24.6 pg — ABNORMAL LOW (ref 26.6–33.0)
MCHC: 31.6 g/dL (ref 31.5–35.7)
MCV: 78 fL — ABNORMAL LOW (ref 79–97)
Platelets: 411 10*3/uL (ref 150–450)
RBC: 4.91 x10E6/uL (ref 3.77–5.28)
RDW: 15.4 % (ref 11.7–15.4)
WBC: 7.5 10*3/uL (ref 3.4–10.8)

## 2022-06-14 LAB — HEMOGLOBIN A1C
Est. average glucose Bld gHb Est-mCnc: 120 mg/dL
Hgb A1c MFr Bld: 5.8 % — ABNORMAL HIGH (ref 4.8–5.6)

## 2022-06-15 ENCOUNTER — Telehealth: Payer: Self-pay

## 2022-06-15 NOTE — Telephone Encounter (Signed)
Called to discuss PREP program referral; left voicemail. 

## 2022-06-16 ENCOUNTER — Other Ambulatory Visit (HOSPITAL_COMMUNITY): Payer: Self-pay

## 2022-06-16 ENCOUNTER — Telehealth: Payer: Self-pay

## 2022-06-16 NOTE — Telephone Encounter (Signed)
Returned my call, would like to attend PREP at [facility] on 07/10/22 every M/W 3:30-4:45; will contact early January to set up assessment visit.

## 2022-06-20 ENCOUNTER — Encounter: Payer: Self-pay | Admitting: Internal Medicine

## 2022-06-23 ENCOUNTER — Other Ambulatory Visit (HOSPITAL_COMMUNITY): Payer: Self-pay

## 2022-06-29 ENCOUNTER — Telehealth: Payer: Self-pay

## 2022-06-29 NOTE — Telephone Encounter (Signed)
Called to confirm participation in [place] class at [facility] on 07/10/22; assessment scheduled for 07/03/22 at 2 pm

## 2022-07-03 ENCOUNTER — Other Ambulatory Visit: Payer: BC Managed Care – PPO

## 2022-07-03 NOTE — Progress Notes (Signed)
[facility] PREP Evaluation  Patient Details  Name: Brooke [name] MRN: [id number] Date of Birth: [birth year] Age: 52 y.o. PCP: Glendale Chard, MD  Vitals:   07/03/22 1448  BP: (!) 156/98  Pulse: 73  SpO2: 98%  Weight: 297 lb (134.7 kg)     [facility] Eval - 07/03/22 1400       [facility] "PREP" Location   [facility] "PREP" Location [place]      Referral    Referring Provider [place]    Reason for referral Hypertension;Inactivity;Obesitity/Overweight    Program Start Date 07/10/22      Measurement   Waist Circumference 51 inches    Hip Circumference 59 inches    Body fat --   E4     Information for Trainer   Goals --   To lose 15 pounds by end of program; establish strength training program   Current Exercise --   walking dogs   Orthopedic Concerns --   back pain, knee pain, unstable ankles   Pertinent Medical History --   HTN   Current Barriers --   work     Timed Up and Go (TUGS)   Timed Up and Go Low risk <9 seconds      Mobility and Daily Activities   I find it easy to walk up or down two or more flights of stairs. 1    I have no trouble taking out the trash. 4    I do housework such as vacuuming and dusting on my own without difficulty. 4    I can easily lift a gallon of milk (8lbs). 3    I can easily walk a mile. 1    I have no trouble reaching into high cupboards or reaching down to pick up something from the floor. 2    I do not have trouble doing out-door work such as Armed forces logistics/support/administrative officer, raking leaves, or gardening. 1      Mobility and Daily Activities   I feel younger than my age. 1    I feel independent. 4    I feel energetic. 1    I live an active life.  1    I feel strong. 1    I feel healthy. 1    I feel active as other people my age. 1      How fit and strong are you.   Fit and Strong Total Score 26            Past Medical History:  Diagnosis Date   ADD (attention deficit disorder)    Allergy    allergic rhinitis   Anemia    nos   Anxiety     Arthritis    right ankle   Asthma    as a child   Back pain    Constipation    Depression    Elevated blood pressure reading without diagnosis of hypertension    Genital herpes    Lactose intolerance    Migraine    Prediabetes    Sleep apnea    Stress    Swelling    Past Surgical History:  Procedure Laterality Date   ANKLE SURGERY     COLONOSCOPY WITH PROPOFOL N/A 10/06/2019   Procedure: COLONOSCOPY WITH PROPOFOL;  Surgeon: Ronnette Juniper, MD;  Location: [facility];  Service: Gastroenterology;  Laterality: N/A;   POLYPECTOMY  10/06/2019   Procedure: POLYPECTOMY;  Surgeon: Ronnette Juniper, MD;  Location: [facility];  Service: Gastroenterology;;  Social History   Tobacco Use  Smoking Status Never  Smokeless Tobacco Never  To begin PREP class at [facility] on 07/10/22, every M/W 3:30-4:45  [name] 07/03/2022, 2:52 PM

## 2022-07-06 ENCOUNTER — Encounter: Payer: Self-pay | Admitting: Internal Medicine

## 2022-07-10 ENCOUNTER — Encounter: Payer: Self-pay | Admitting: Internal Medicine

## 2022-07-10 ENCOUNTER — Ambulatory Visit (INDEPENDENT_AMBULATORY_CARE_PROVIDER_SITE_OTHER): Payer: BC Managed Care – PPO | Admitting: Internal Medicine

## 2022-07-10 VITALS — BP 120/84 | HR 70 | Temp 98.1°F | Wt 298.0 lb

## 2022-07-10 DIAGNOSIS — Z6841 Body Mass Index (BMI) 40.0 and over, adult: Secondary | ICD-10-CM | POA: Diagnosis not present

## 2022-07-10 DIAGNOSIS — J069 Acute upper respiratory infection, unspecified: Secondary | ICD-10-CM

## 2022-07-10 MED ORDER — HYDROCODONE BIT-HOMATROP MBR 5-1.5 MG/5ML PO SOLN: 5.0000 mL | Freq: Four times a day (QID) | ORAL | 0 refills | Status: DC | PRN

## 2022-07-10 MED ORDER — BENZONATATE 100 MG PO CAPS: 100.0000 mg | ORAL_CAPSULE | Freq: Three times a day (TID) | ORAL | 1 refills | Status: DC | PRN

## 2022-07-10 MED ORDER — TRIAMCINOLONE ACETONIDE 40 MG/ML IJ SUSP: 40.0000 mg | Freq: Once | INTRAMUSCULAR | Status: DC

## 2022-07-10 MED ORDER — ALBUTEROL SULFATE HFA 108 (90 BASE) MCG/ACT IN AERS: 2.0000 | INHALATION_SPRAY | Freq: Four times a day (QID) | RESPIRATORY_TRACT | 2 refills | Status: DC | PRN

## 2022-07-10 NOTE — Progress Notes (Unsigned)
Rich Brave [place],acting as a Education administrator for Maximino Greenland, MD.,have documented all relevant documentation on the behalf of Maximino Greenland, MD,as directed by  Maximino Greenland, MD while in the presence of Maximino Greenland, MD.    Subjective:     Patient ID: Brooke [name] , female    DOB: 08-30-1970 , 52 y.o.   MRN: [id number]   Chief Complaint  Patient presents with   Cough    HPI  Patient presents today for a cough for the past 3 weeks. She denies known ill contacts. Initially, she just had a dry cough. It has deepened over the past week. Admits she has  busy with her sister's wedding. Did not initially seek medical evaluation. Denies fever, has had some body aches and congestion. Denies sore throat.   Cough This is a new problem. The current episode started 1 to 4 weeks ago. The problem has been unchanged. The cough is Productive of brown sputum. Associated symptoms include nasal congestion, postnasal drip, rhinorrhea, shortness of breath and wheezing. Pertinent negatives include no sore throat.     Past Medical History:  Diagnosis Date   ADD (attention deficit disorder)    Allergy    allergic rhinitis   Anemia    nos   Anxiety    Arthritis    right ankle   Asthma    as a child   Back pain    Constipation    Depression    Elevated blood pressure reading without diagnosis of hypertension    Genital herpes    Lactose intolerance    Migraine    Prediabetes    Sleep apnea    Stress    Swelling      Family History  Problem Relation Age of Onset   Hypertension Mother    Diabetes Mother    Hyperlipidemia Mother    Hypertension Father    Sleep apnea Father    Alcoholism Father    Obesity Father    Breast cancer Sister    Heart failure Maternal Grandmother    Heart failure Maternal Grandfather    Diabetes Maternal Aunt    Diabetes Maternal Uncle    Heart failure Paternal Aunt    Diabetes Paternal Aunt    Hypertension Other      Current Outpatient  Medications:    albuterol (VENTOLIN HFA) 108 (90 Base) MCG/ACT inhaler, Inhale 2 puffs into the lungs every 6 (six) hours as needed for wheezing or shortness of breath., Disp: 8 g, Rfl: 2   benzonatate (TESSALON PERLES) 100 MG capsule, Take 1 capsule (100 mg total) by mouth 3 (three) times daily as needed for cough., Disp: 30 capsule, Rfl: 1   HYDROcodone bit-homatropine (HYDROMET) 5-1.5 MG/5ML syrup, Take 5 mLs by mouth every 6 (six) hours as needed., Disp: 120 mL, Rfl: 0   lisdexamfetamine (VYVANSE) 40 MG capsule, Take 1 capsule (40 mg total) by mouth daily., Disp: 30 capsule, Rfl: 0   Multiple Vitamin (MULTIVITAMIN) tablet, Take 1 tablet by mouth daily., Disp: , Rfl:    nebivolol (BYSTOLIC) 10 MG tablet, TAKE 1 TABLET(10 MG) BY MOUTH DAILY, Disp: 90 tablet, Rfl: 0   sertraline (ZOLOFT) 100 MG tablet, TAKE 1 TABLET(100 MG) BY MOUTH DAILY, Disp: 90 tablet, Rfl: 1   valACYclovir (VALTREX) 500 MG tablet, Take 1 tablet (500 mg total) by mouth as needed (genital herpes)., Disp: 90 tablet, Rfl: 0   valsartan (DIOVAN) 160 MG tablet, TAKE 1 TABLET(160 MG) BY MOUTH  DAILY, Disp: 30 tablet, Rfl: 11   Vitamin D, Cholecalciferol, 25 MCG (1000 UT) TABS, Take 5,000 Units by mouth daily. , Disp: , Rfl:    No Known Allergies   Review of Systems  Constitutional: Negative.   HENT:  Positive for postnasal drip and rhinorrhea. Negative for sore throat.   Respiratory:  Positive for cough, shortness of breath and wheezing.   Cardiovascular: Negative.   Gastrointestinal: Negative.   Neurological: Negative.   Psychiatric/Behavioral: Negative.       Today's Vitals   07/10/22 1442  BP: 120/84  Pulse: 70  Temp: 98.1 F (36.7 C)  SpO2: 98%  Weight: 298 lb (135.2 kg)   Body mass index is 52.12 kg/m.   Objective:  Physical Exam Vitals and nursing note reviewed.  Constitutional:      Appearance: Normal appearance.  HENT:     Head: Normocephalic and atraumatic.     Right Ear: Tympanic membrane, ear canal  and external ear normal. There is no impacted cerumen.     Left Ear: Tympanic membrane, ear canal and external ear normal. There is no impacted cerumen.  Eyes:     Extraocular Movements: Extraocular movements intact.  Cardiovascular:     Rate and Rhythm: Normal rate and regular rhythm.     Heart sounds: Normal heart sounds.  Pulmonary:     Effort: Pulmonary effort is normal.     Breath sounds: Rhonchi present.  Musculoskeletal:     Cervical back: Normal range of motion.  Skin:    General: Skin is warm.  Neurological:     General: No focal deficit present.     Mental Status: She is alert.  Psychiatric:        Mood and Affect: Mood normal.        Behavior: Behavior normal.         Assessment And Plan:     1. Viral URI with cough Comments: I suggested Kenalog IM x 1, she declined due to recent joint steroid injection.  I will send rx benzonatate capsules to use prn and hydromet syrup to use nightly. I will also send rx albuterol inhaler to use prn. I defer other treatment until I have reviewed resp panel results.  - Respiratory Panel w/ SARS-CoV2  2. Class 3 severe obesity due to excess calories with serious comorbidity and body mass index (BMI) of 50.0 to 59.9 in adult [facility]) Comments: She is encouraged to aim for at least 150 minutes of exercise/week, while initially striving for BMI<45 to decrease cardiac risk.   Patient was given opportunity to ask questions. Patient verbalized understanding of the plan and was able to repeat key elements of the plan. All questions were answered to their satisfaction.   I, Maximino Greenland, MD, have reviewed all documentation for this visit. The documentation on 07/11/22 for the exam, diagnosis, procedures, and orders are all accurate and complete.   IF YOU HAVE BEEN REFERRED TO A SPECIALIST, IT MAY TAKE 1-2 WEEKS TO SCHEDULE/PROCESS THE REFERRAL. IF YOU HAVE NOT HEARD FROM US/SPECIALIST IN TWO WEEKS, PLEASE GIVE [place] A CALL AT [phone].    THE PATIENT IS ENCOURAGED TO PRACTICE SOCIAL DISTANCING DUE TO THE COVID-19 PANDEMIC.

## 2022-07-10 NOTE — Progress Notes (Signed)
[facility] PREP Weekly Session  Patient Details  Name: Brooke [name] MRN: [id number] Date of Birth: 03/21/1971 Age: 52 y.o. PCP: Glendale Chard, MD  There were no vitals filed for this visit.   [facility] Weekly seesion - 07/10/22 1700       [facility] "PREP" Location   [facility] "PREP" Location [facility]      Weekly Session   Topic Discussed Goal setting and welcome to the program   Introductions, review of PREP notebook, tour of facility   Classes attended to date [place] 07/10/2022, 5:01 PM

## 2022-07-10 NOTE — Patient Instructions (Signed)

## 2022-07-12 LAB — RESPIRATORY PANEL W/ SARS-COV2

## 2022-07-13 ENCOUNTER — Other Ambulatory Visit: Payer: Self-pay

## 2022-07-13 ENCOUNTER — Telehealth: Payer: Self-pay

## 2022-07-13 ENCOUNTER — Encounter: Payer: Self-pay | Admitting: Internal Medicine

## 2022-07-13 MED ORDER — AZITHROMYCIN 250 MG PO TABS: ORAL_TABLET | ORAL | 0 refills | Status: AC

## 2022-07-13 NOTE — Telephone Encounter (Signed)
Error

## 2022-07-17 NOTE — Progress Notes (Signed)
[facility] PREP Weekly Session  Patient Details  Name: Brooke [name] MRN: [id number] Date of Birth: 14-Feb-1971 Age: 52 y.o. PCP: Glendale Chard, MD  Vitals:   07/17/22 1651  Weight: 291 lb 3.2 oz (132.1 kg)     [facility] Weekly seesion - 07/17/22 1600       [facility] "PREP" Location   [facility] "PREP[facility] Location [facility]      Weekly Session   Topic Discussed Importance of resistance training;Other ways to be active   Cardio: work up to 150 minutes/week; strength training start at twice a week, work up for 3 times a week for 20-40 minutes   Classes attended to date [place] 07/17/2022, 4:52 PM

## 2022-07-25 NOTE — Progress Notes (Signed)
[facility] PREP Weekly Session  Patient Details  Name: Brooke [name] MRN: [id number] Date of Birth: Jul 09, 1970 Age: 52 y.o. PCP: Glendale Chard, MD  Vitals:   07/25/22 0836  Weight: 295 lb (133.8 kg)     [facility] Weekly seesion - 07/25/22 0800       [facility] "PREP" Location   [facility] "PREP[facility] Location [facility]      Weekly Session   Topic Discussed Healthy eating tips   Foods to reduce, foods to increase: introduction to [employer]; water: 1/2 body wt in oz.; eat the rainbow of colors   Classes attended to date [place] 07/25/2022, 8:37 AM

## 2022-07-26 ENCOUNTER — Other Ambulatory Visit: Payer: BC Managed Care – PPO

## 2022-07-31 ENCOUNTER — Other Ambulatory Visit: Payer: Self-pay | Admitting: Internal Medicine

## 2022-07-31 ENCOUNTER — Other Ambulatory Visit: Payer: Self-pay

## 2022-07-31 NOTE — Progress Notes (Signed)
[facility] PREP Weekly Session  Patient Details  Name: Brooke [name] MRN: [id number] Date of Birth: 05/29/1971 Age: 52 y.o. PCP: Glendale Chard, MD  Vitals:   07/31/22 1654  Weight: 297 lb (134.7 kg)     [facility] Weekly seesion - 07/31/22 1600       [facility] "PREP" Location   [facility]PREP[facility] Location [facility]      Weekly Session   Topic Discussed Health habits   Sugar demo   Minutes exercised this week 60 minutes    Classes attended to date [place] 07/31/2022, 4:55 PM

## 2022-08-07 NOTE — Progress Notes (Signed)
[facility] PREP Weekly Session  Patient Details  Name: Brooke [name] MRN: [id number] Date of Birth: August 29, 1970 Age: 52 y.o. PCP: Glendale Chard, MD  Vitals:   08/07/22 1638  Weight: 296 lb (134.3 kg)     [facility] Weekly seesion - 08/07/22 1600       [facility] "PREP" Location   [facility] "PREP[facility] Location [facility]      Weekly Session   Topic Discussed Restaurant Eating   Salt demo, limit Na intake to 1500-2333m/day;   Minutes exercised this week 80 minutes    Classes attended to date [place]/05/2023, 4:39 PM

## 2022-08-08 ENCOUNTER — Ambulatory Visit (INDEPENDENT_AMBULATORY_CARE_PROVIDER_SITE_OTHER): Payer: BC Managed Care – PPO | Admitting: Internal Medicine

## 2022-08-08 ENCOUNTER — Encounter: Payer: Self-pay | Admitting: Internal Medicine

## 2022-08-08 VITALS — BP 126/88 | HR 68 | Temp 98.1°F | Ht 63.0 in | Wt 296.2 lb

## 2022-08-08 DIAGNOSIS — I10 Essential (primary) hypertension: Secondary | ICD-10-CM

## 2022-08-08 DIAGNOSIS — Z6841 Body Mass Index (BMI) 40.0 and over, adult: Secondary | ICD-10-CM

## 2022-08-08 DIAGNOSIS — F988 Other specified behavioral and emotional disorders with onset usually occurring in childhood and adolescence: Secondary | ICD-10-CM

## 2022-08-08 DIAGNOSIS — F331 Major depressive disorder, recurrent, moderate: Secondary | ICD-10-CM | POA: Diagnosis not present

## 2022-08-08 NOTE — Patient Instructions (Addendum)
The 10-year ASCVD risk score (Arnett DK, et al., 2019) is: 3.3%   Values used to calculate the score:     Age: 52 years     Sex: Female     Is Non-Hispanic African American: Yes     Diabetic: No     Tobacco smoker: No     Systolic Blood Pressure: 123XX123 mmHg     Is BP treated: Yes     HDL Cholesterol: 69 mg/dL     Total Cholesterol: 234 mg/dL   Attention Deficit Hyperactivity Disorder, Adult Attention deficit hyperactivity disorder (ADHD) is a mental health disorder that starts during childhood. For many people with ADHD, the disorder continues into the adult years. Treatment can help you manage your symptoms. There are three main types of ADHD: Inattentive. With this type, adults have difficulty paying attention. This may affect cognitive abilities. Hyperactive-impulsive. With this type, adults have a lot of energy and have difficulty controlling their behavior. Combination type. Some people may have symptoms of both types. What are the causes? The exact cause of ADHD is not known. Most experts believe a person's genes and environment possibly contribute to ADHD. What increases the risk? The following factors may make you more likely to develop this condition: Having a first-degree relative such as a parent, brother, or sister, with the condition. Being born before 52 weeks of pregnancy (prematurely) or at a low birth weight. Being born to a mother who smoked tobacco or drank alcohol during pregnancy. Having experienced a brain injury. Being exposed to lead or other toxins in the womb or early in life. What are the signs or symptoms? Symptoms of this condition depend on the type of ADHD. Symptoms of the inattentive type include: Difficulty paying attention or following instructions. Often making simple mistakes. Being disorganized. Avoiding tasks that require time and attention. Losing and forgetting things. Symptoms of the hyperactive-impulsive type include: Restlessness. Talking  out of turn, interrupting others, or talking too much. Difficulty with: Sitting still. Feeling motivated. Relaxing. Waiting in line or waiting for a turn. People with the combination type have symptoms of both of the other types. In adults, this condition may lead to certain problems, such as: Keeping jobs. Performing tasks at work. Having stable relationships. Being on time or keeping to a schedule. How is this diagnosed? This condition is diagnosed based on your current symptoms and your history of symptoms. The diagnosis can be made by a health care provider such as a primary care provider or a mental health care specialist. Your health care provider may use a symptom checklist or a behavior rating scale to evaluate your symptoms. Your health care provider may also want to talk with people who have observed your behaviors throughout your life. How is this treated? This condition can be treated with medicines and behavior therapy. Medicines may be the best option to reduce impulsive behaviors and improve attention. Your health care provider may recommend: Stimulant medicines. These are the most common medicines used for adult ADHD. They affect certain chemicals in the brain (neurotransmitters) and improve your ability to control your symptoms. A non-stimulant medicine. These medicines can also improve focus, attention, and impulsive behavior. It may take weeks to months to see the effects of this medicine. Counseling and behavioral management are also important for treating ADHD. Counseling is often used along with medicine. Your health care provider may suggest: Cognitive behavioral therapy (CBT). This type of therapy teaches you to replace negative thoughts and actions with positive thoughts  and actions. When used as part of ADHD treatment, this therapy may also include: Coping strategies for organization, time management, impulse control, and stress reduction. Mindfulness and meditation  training. Behavioral management. You may work with a Leisure centre manager who is specially trained to help people with ADHD manage and organize activities and function more effectively. Follow these instructions at home: Medicines  Take over-the-counter and prescription medicines only as told by your health care provider. Talk with your health care provider about the possible side effects of your medicines and how to manage them. Alcohol use Do not drink alcohol if: Your health care provider tells you not to drink. You are pregnant, may be pregnant, or are planning to become pregnant. If you drink alcohol: Limit how much you use to: 0-1 drink a day for women. 0-2 drinks a day for men. Know how much alcohol is in your drink. In the [place] one drink equals one 12 oz bottle of beer (355 mL), one 5 oz glass of wine (148 mL), or one 1 oz glass of hard liquor (44 mL). Lifestyle  Do not use illegal drugs. Get enough sleep. Eat a healthy diet. Exercise regularly. Exercise can help to reduce stress and anxiety. General instructions Learn as much as you can about adult ADHD, and work closely with your health care providers to find the treatments that work best for you. Follow the same schedule each day. Use reminder devices like notes, calendars, and phone apps to stay on time and organized. Keep all follow-up visits. Your health care provider will need to monitor your condition and adjust your treatment over time. Where to find more information A health care provider may be able to recommend resources that are available online or over the phone. You could start with: Attention Deficit Disorder Association (ADDA): [employer] National Institute of Mental Health [facility] [place]): https://www.frey.org/ Contact a health care provider if: Your symptoms continue to cause problems. You have side effects from your medicine, such as: Repeated muscle twitches, coughing, or speech outbursts. Sleep problems. Loss of  appetite. Dizziness. Unusually fast heartbeat. Stomach pains. Headaches. You are struggling with anxiety, depression, or substance abuse. Get help right away if: You have a severe reaction to a medicine. This symptom may be an emergency. Get help right away. Call 911. Do not wait to see if the symptom will go away. Do not drive yourself to the hospital. Take one of these steps if you feel like you may hurt yourself or others, or have thoughts about taking your own life: Go to your nearest emergency room. Call 911. Call the [place] at [phone] or 988. This is open 24 hours a day Text the Crisis Text Line at 339-200-3628. Summary ADHD is a mental health disorder that starts during childhood and often continues into your adult years. The exact cause of ADHD is not known. Most experts believe genetics and environmental factors contribute to ADHD. There is no cure for ADHD, but treatment with medicine, cognitive behavioral therapy, or behavioral management can help you manage your condition. This information is not intended to replace advice given to you by your health care provider. Make sure you discuss any questions you have with your health care provider. Document Revised: 09/30/2021 Document Reviewed: 09/30/2021 Elsevier Patient Education  [place].

## 2022-08-08 NOTE — Progress Notes (Signed)
I,Brooke [name],acting as a scribe for Brooke Greenland, MD.,have documented all relevant documentation on the behalf of Brooke Greenland, MD,as directed by  Brooke Greenland, MD while in the presence of Brooke Greenland, MD.    Subjective:     Patient ID: Brooke [name] , female    DOB: March 14, 1971 , 52 y.o.   MRN: [id number]   Chief Complaint  Patient presents with   Depression    HPI  The patient is here today for a follow-up on depression and ADHD.  She has been evaluated by Psych, Dr. Laverta [name] and she is pleased with her visits.  He has stopped the Vyvanse and added Adderall.  He has also increased her sertraline.  He also added zaleplon to help her sleep.  She reports compliance with meds. She has not had any issues with the medication. She is due to return to work in early March 2024.   She has pap scheduled for 02/26.    Hypertension This is a chronic problem. The current episode started more than 1 year ago. The problem has been gradually improving since onset. Pertinent negatives include no blurred vision, chest pain, palpitations or shortness of breath. Risk factors for coronary artery disease include obesity. Past treatments include diuretics. The current treatment provides mild improvement. Compliance problems include exercise.      Past Medical History:  Diagnosis Date   ADD (attention deficit disorder)    Allergy    allergic rhinitis   Anemia    nos   Anxiety    Arthritis    right ankle   Asthma    as a child   Back pain    Constipation    Depression    Elevated blood pressure reading without diagnosis of hypertension    Genital herpes    Lactose intolerance    Migraine    Prediabetes    Sleep apnea    Stress    Swelling      Family History  Problem Relation Age of Onset   Hypertension Mother    Diabetes Mother    Hyperlipidemia Mother    Hypertension Father    Sleep apnea Father    Alcoholism Father    Obesity Father    Breast cancer Sister     Diabetes Maternal Aunt    Diabetes Maternal Uncle    Heart failure Paternal Aunt    Diabetes Paternal Aunt    Heart failure Maternal Grandmother    Heart failure Maternal Grandfather      Current Outpatient Medications:    albuterol (VENTOLIN HFA) 108 (90 Base) MCG/ACT inhaler, Inhale 2 puffs into the lungs every 6 (six) hours as needed for wheezing or shortness of breath., Disp: 8 g, Rfl: 2   amphetamine-dextroamphetamine (ADDERALL XR) 30 MG 24 hr capsule, Take 30 mg by mouth daily., Disp: , Rfl:    benzonatate (TESSALON PERLES) 100 MG capsule, Take 1 capsule (100 mg total) by mouth 3 (three) times daily as needed for cough., Disp: 30 capsule, Rfl: 1   HYDROcodone bit-homatropine (HYDROMET) 5-1.5 MG/5ML syrup, Take 5 mLs by mouth every 6 (six) hours as needed., Disp: 120 mL, Rfl: 0   Multiple Vitamin (MULTIVITAMIN) tablet, Take 1 tablet by mouth daily., Disp: , Rfl:    nebivolol (BYSTOLIC) 10 MG tablet, TAKE 1 TABLET(10 MG) BY MOUTH DAILY, Disp: 90 tablet, Rfl: 0   sertraline (ZOLOFT) 100 MG tablet, TAKE 1 TABLET(100 MG) BY MOUTH DAILY (Patient taking differently: TAKE  1 TABLET(150 MG) BY MOUTH DAILY), Disp: 90 tablet, Rfl: 1   valACYclovir (VALTREX) 500 MG tablet, Take 1 tablet (500 mg total) by mouth as needed (genital herpes)., Disp: 90 tablet, Rfl: 0   valsartan (DIOVAN) 160 MG tablet, TAKE 1 TABLET(160 MG) BY MOUTH DAILY, Disp: 30 tablet, Rfl: 11   Vitamin D, Cholecalciferol, 25 MCG (1000 UT) TABS, Take 5,000 Units by mouth daily. , Disp: , Rfl:    zaleplon (SONATA) 10 MG capsule, Take 10 mg by mouth daily., Disp: , Rfl:    No Known Allergies   Review of Systems  Constitutional: Negative.   Eyes:  Negative for blurred vision.  Respiratory: Negative.  Negative for shortness of breath.   Cardiovascular: Negative.  Negative for chest pain and palpitations.  Neurological: Negative.   Psychiatric/Behavioral: Negative.       Today's Vitals   08/08/22 0958 08/08/22 1029  BP: (!)  138/90 126/88  Pulse: 68   Temp: 98.1 F (36.7 C)   SpO2: 98%   Weight: 296 lb 3.2 oz (134.4 kg)   Height: 5' 3"$  (1.6 m)    Body mass index is 52.47 kg/m.  Wt Readings from Last 3 Encounters:  08/08/22 296 lb 3.2 oz (134.4 kg)  08/07/22 296 lb (134.3 kg)  07/31/22 297 lb (134.7 kg)    Objective:  Physical Exam Vitals and nursing note reviewed.  Constitutional:      Appearance: Normal appearance. She is obese.  HENT:     Head: Normocephalic and atraumatic.     Nose:     Comments: Masked    Mouth/Throat:     Comments: Masked  Eyes:     Extraocular Movements: Extraocular movements intact.  Cardiovascular:     Rate and Rhythm: Normal rate and regular rhythm.     Heart sounds: Normal heart sounds.  Pulmonary:     Effort: Pulmonary effort is normal.     Breath sounds: Normal breath sounds.  Musculoskeletal:     Cervical back: Normal range of motion.  Skin:    General: Skin is warm.  Neurological:     General: No focal deficit present.     Mental Status: She is alert.  Psychiatric:        Mood and Affect: Mood normal.        Behavior: Behavior normal.       Assessment And Plan:     1. Moderate episode of recurrent major depressive disorder ([place]) Comments: Chronic, Psych input appreciated. Sertraline dose increased to 175m daily. She will c/w care under Dr. LLaverta [name]  2. ADD (attention deficit disorder) without hyperactivity Comments: Chronic, now on Adderall. She is encouraged to stay well hydrated and limit intake of caffeinated beverages.  3. Essential hypertension Comments: Chronic, fairly controlled. She will c/w valsartan 1651mqd and nebivolol 1064mn the evenings. Encouraged to incorporate more exercise into her daily routine.  4. Class 3 severe obesity due to excess calories with serious comorbidity and body mass index (BMI) of 50.0 to 59.9 in adult (HCC) BMI 52. She is encouraged to initially strive for BMI less than 45 to decrease cardiac risk. Advised to aim  for at least 150 minutes of exercise per week.    Patient was given opportunity to ask questions. Patient verbalized understanding of the plan and was able to repeat key elements of the plan. All questions were answered to their satisfaction.   I, RobMaximino GreenlandD, have reviewed all documentation for this visit. The documentation  on 08/08/22 for the exam, diagnosis, procedures, and orders are all accurate and complete.   IF YOU HAVE BEEN REFERRED TO A SPECIALIST, IT MAY TAKE 1-2 WEEKS TO SCHEDULE/PROCESS THE REFERRAL. IF YOU HAVE NOT HEARD FROM US/SPECIALIST IN TWO WEEKS, PLEASE GIVE [place] A CALL AT [phone].   THE PATIENT IS ENCOURAGED TO PRACTICE SOCIAL DISTANCING DUE TO THE COVID-19 PANDEMIC.

## 2022-08-09 IMAGING — CT CT CHEST W/O CM
2 of 5 series · 15 of 36 positions shown, 18 images · non-contrast
Comparison: None.

CLINICAL DATA: Left rib pain after fall several days ago.

EXAM:
CT CHEST WITHOUT CONTRAST
TECHNIQUE: Multidetector CT imaging of the chest was performed following the
standard protocol without IV contrast.

[Series 4: chest 2.00 br40 s3 · coronal · 0.61mm/px · 3 of 167 slices shown]
[im 34/167  lung]
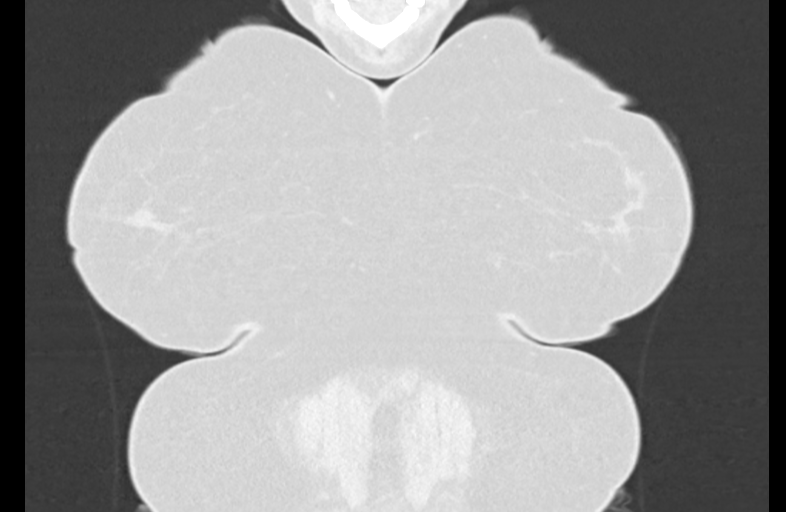
[im 67/167  lung]
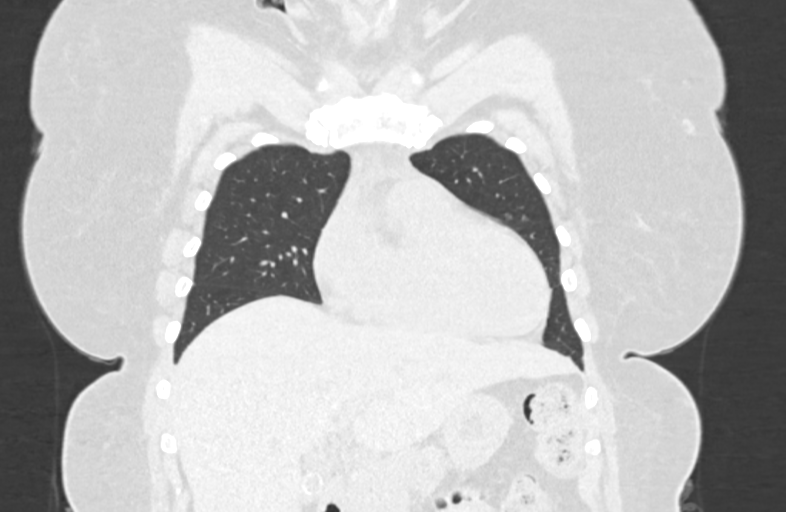
[im 100/167  lung]
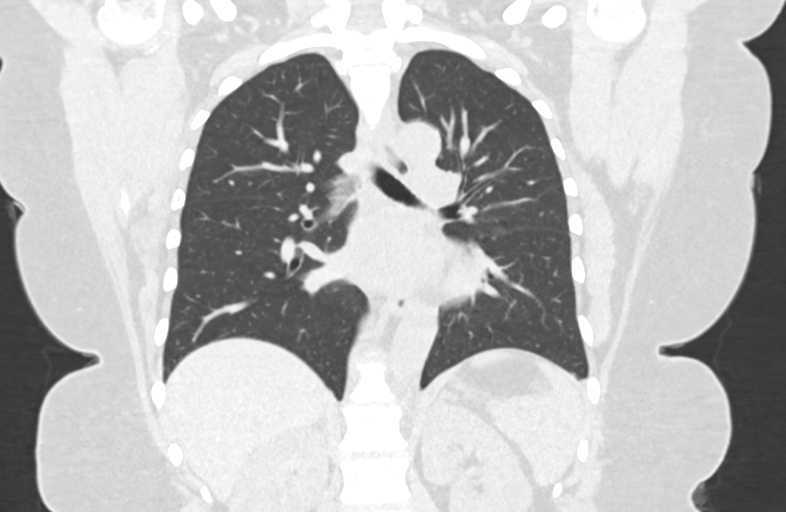

[Series 10: chest 1.00 br40 s3 super d · axial · 0.89mm/px · z∈[+1449,+1719]mm · 12 of 389 slices shown, 15 images]
[im 26/389  mediastinal]
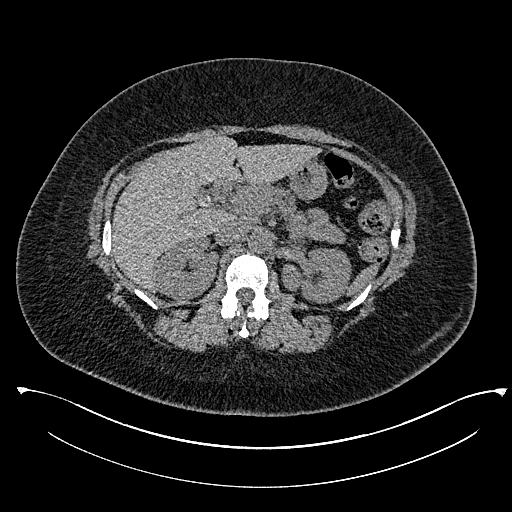
[im 26/389  lung]
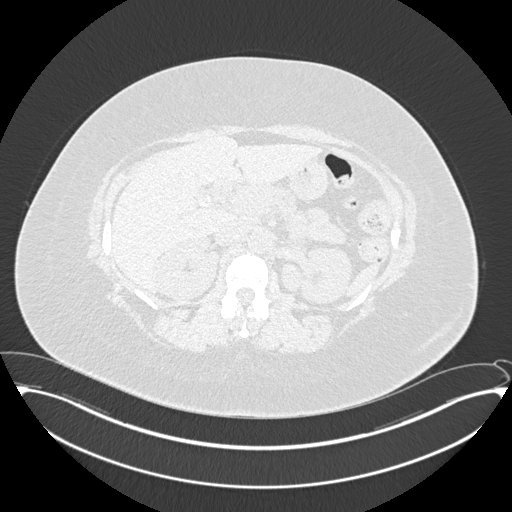
[im 52/389  lung]
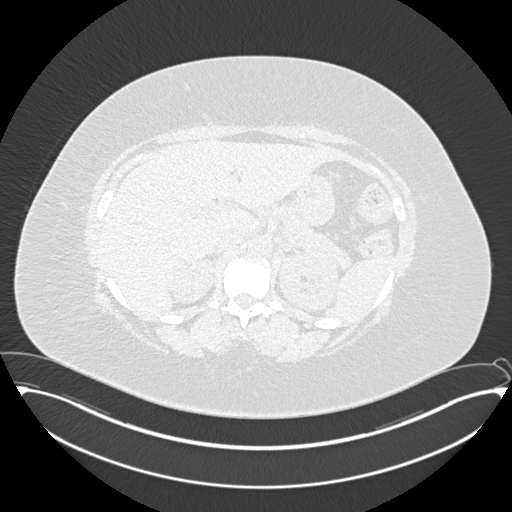
[im 78/389  lung]
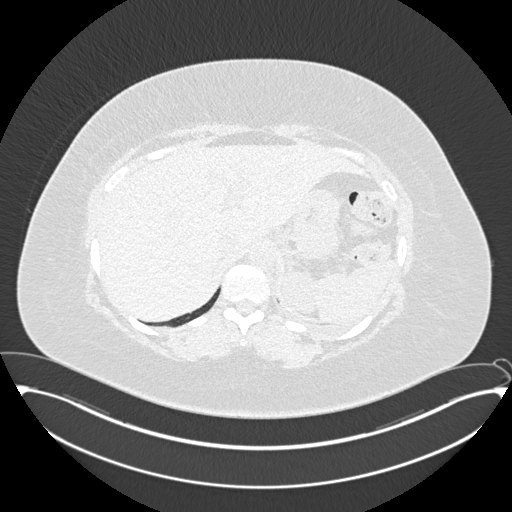
[im 130/389  lung]
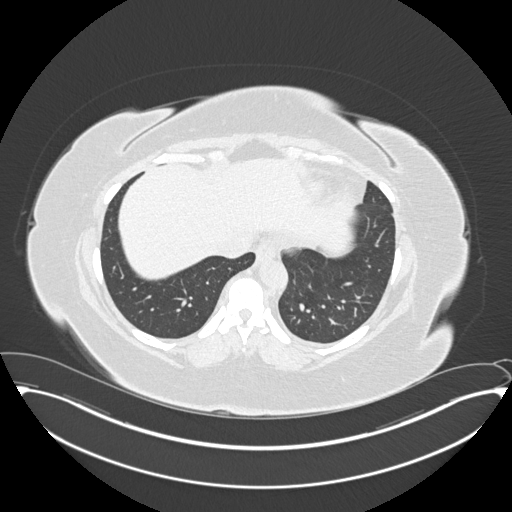
[im 156/389  mediastinal]
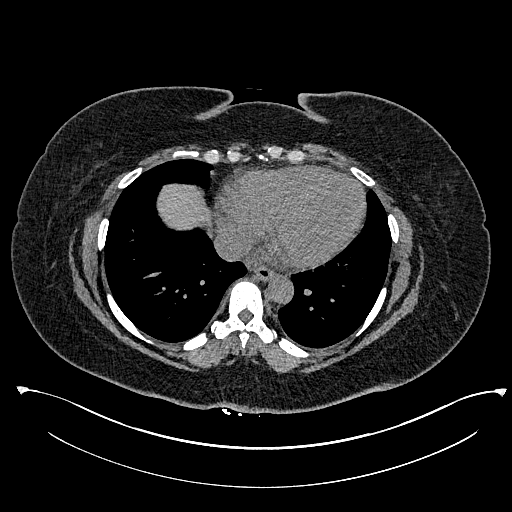
[im 156/389  lung]
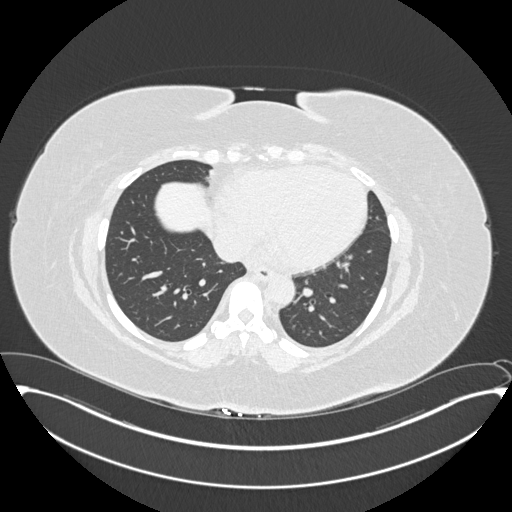
[im 182/389  lung]
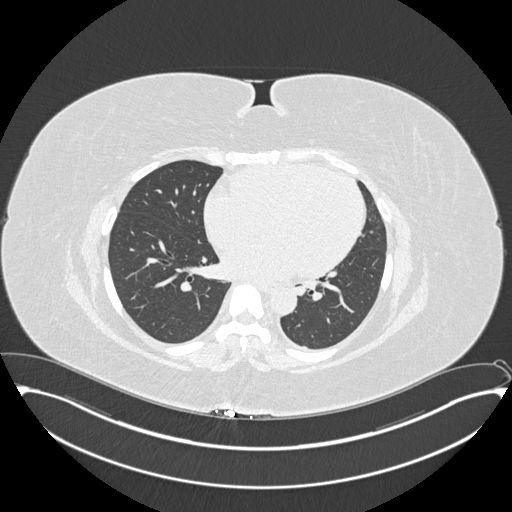
[im 207/389  lung]
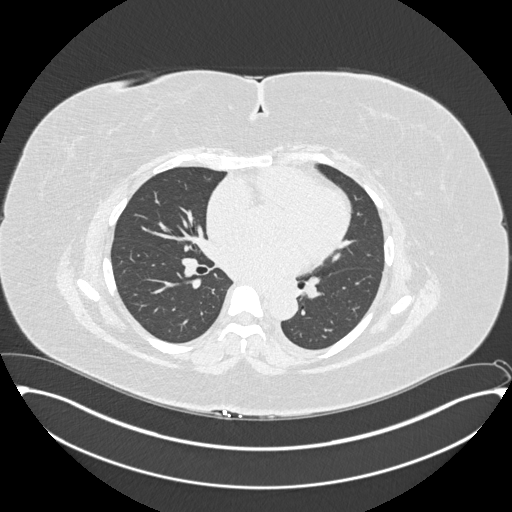
[im 233/389  lung]
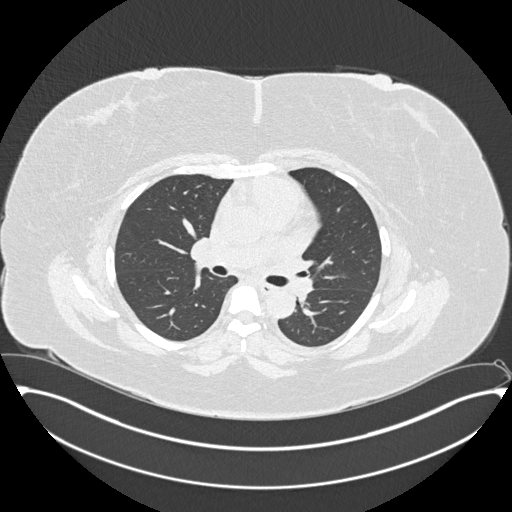
[im 259/389  mediastinal]
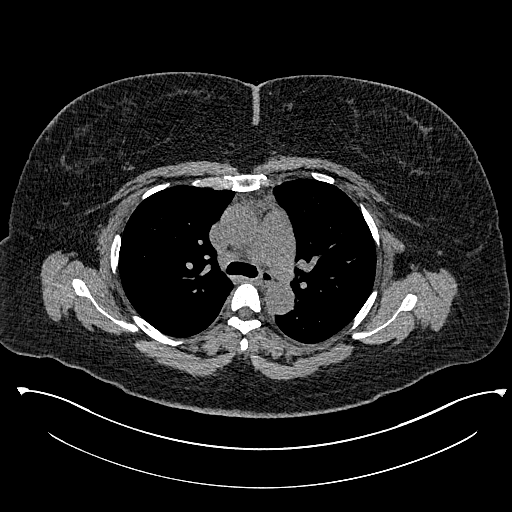
[im 259/389  lung]
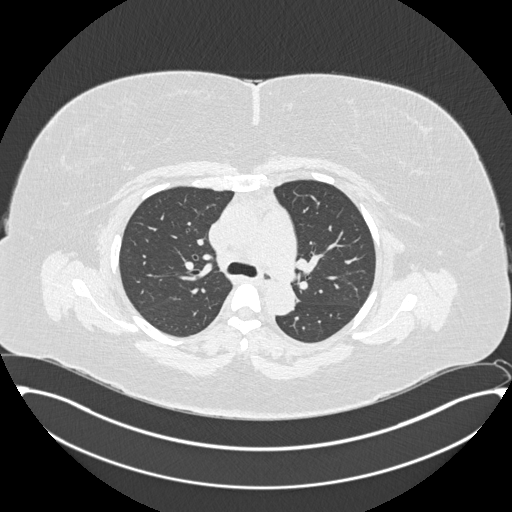
[im 311/389  lung]
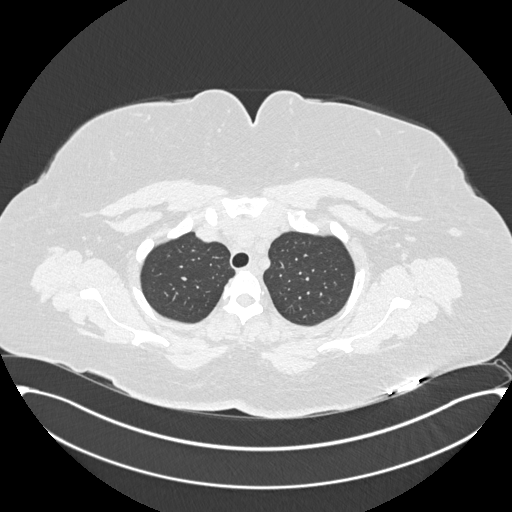
[im 337/389  lung]
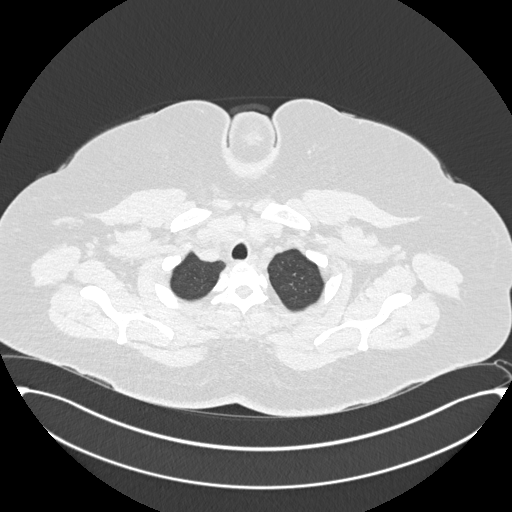
[im 363/389  lung]
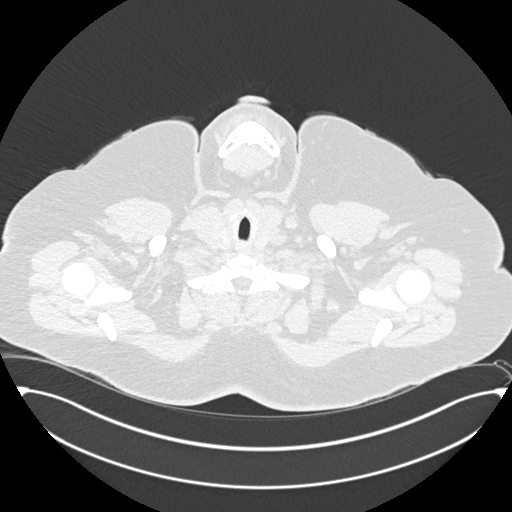

[15 of 36 positions shown; findings below may reference images not displayed]

FINDINGS: Cardiovascular: No significant vascular findings. Normal heart size.
No pericardial effusion.

Mediastinum/Nodes: No enlarged mediastinal or axillary lymph nodes.
Thyroid gland, trachea, and esophagus demonstrate no significant
findings.

Lungs/Pleura: No pneumothorax or pleural effusion is noted. Right
lung is clear. 3 mm nodule is noted anteriorly in the left upper
lobe best seen on image number 47 of series 8.

Upper Abdomen: Cholelithiasis.

Musculoskeletal: No chest wall mass or suspicious bone lesions
identified.
IMPRESSION: 3 mm nodule seen in left upper lobe. No follow-up needed if patient
is low-risk. Non-contrast chest CT can be considered in 12 months if
patient is high-risk. This recommendation follows the consensus
statement: Guidelines for Management of Incidental Pulmonary Nodules
Detected on CT Images: From the [HOSPITAL] 0159; Radiology
0159; [DATE].

No other abnormality seen in the chest.

Cholelithiasis.

## 2022-08-21 ENCOUNTER — Telehealth: Payer: Self-pay

## 2022-08-21 NOTE — Telephone Encounter (Signed)
She called to let me know she has decided to stop coming to [employer] classes for now; she is having trouble with her feet and ankles and exercise is challenging. Encouraged her to attempt in the future when she is ready.

## 2022-08-30 LAB — RESULTS CONSOLE HPV: CHL HPV: POSITIVE

## 2022-08-30 LAB — HM PAP SMEAR

## 2022-08-31 ENCOUNTER — Ambulatory Visit: Payer: BC Managed Care – PPO | Admitting: Internal Medicine

## 2022-08-31 ENCOUNTER — Ambulatory Visit
Admission: EM | Admit: 2022-08-31 | Discharge: 2022-08-31 | Disposition: A | Discharge status: Home / Self Care | Payer: BC Managed Care – PPO | Attending: Family Medicine | Admitting: Family Medicine

## 2022-08-31 ENCOUNTER — Encounter: Payer: Self-pay | Admitting: Internal Medicine

## 2022-08-31 DIAGNOSIS — K111 Hypertrophy of salivary gland: Secondary | ICD-10-CM | POA: Diagnosis not present

## 2022-08-31 MED ORDER — IBUPROFEN 800 MG PO TABS: 800.0000 mg | ORAL_TABLET | Freq: Three times a day (TID) | ORAL | 0 refills | Status: DC | PRN

## 2022-08-31 MED ORDER — KETOROLAC TROMETHAMINE 15 MG/ML IJ SOLN
30.0000 mg | Freq: Once | INTRAMUSCULAR | Status: AC
  Administered 2022-08-31: 30 mg via INTRAMUSCULAR

## 2022-08-31 MED ORDER — AMOXICILLIN-POT CLAVULANATE 875-125 MG PO TABS: 1.0000 | ORAL_TABLET | Freq: Two times a day (BID) | ORAL | 0 refills | Status: AC

## 2022-08-31 MED ORDER — KETOROLAC TROMETHAMINE 30 MG/ML IJ SOLN: 30.0000 mg | Freq: Once | INTRAMUSCULAR | Status: DC

## 2022-08-31 NOTE — ED Triage Notes (Signed)
Pt presents with recurrent left jaw pain and swelling since yesterday.

## 2022-08-31 NOTE — Discharge Instructions (Signed)
You have been given a shot of Toradol 30 mg today.  Take amoxicillin-clavulanate 875 mg--1 tab twice daily with food for 7 days  Take ibuprofen 800 mg--1 tab every 8 hours as needed for pain.  Warm compresses can help.

## 2022-08-31 NOTE — ED Provider Notes (Addendum)
[facility]    CSN: [id number] Arrival date & time: 08/31/22  1044      History   Chief Complaint Chief Complaint  Patient presents with   Jaw Pain    HPI Brooke [name] is a 52 y.o. female.   HPI Here for left jaw swelling and pain.  She noted it after she ate out with some friends last night.  It may be has reduced in size just a little bit this morning.  No fever or chills.  No vomiting.  She has never had this for like this before.  Menstrual cycle was mid February  Past Medical History:  Diagnosis Date   ADD (attention deficit disorder)    Allergy    allergic rhinitis   Anemia    nos   Anxiety    Arthritis    right ankle   Asthma    as a child   Back pain    Constipation    Depression    Elevated blood pressure reading without diagnosis of hypertension    Genital herpes    Lactose intolerance    Migraine    Prediabetes    Sleep apnea    Stress    Swelling     Patient Active Problem List   Diagnosis Date Noted   Solitary pulmonary nodule 06/14/2022   ADD (attention deficit disorder) without hyperactivity 12/12/2020   Personal history of COVID-19 12/12/2020   Moderate episode of recurrent major depressive disorder ([place]) 08/14/2018   Annual physical exam 02/08/2011   MUSCLE FASCICULATIONS 06/06/2010   Essential hypertension 11/29/2008   INSOMNIA 11/27/2008   PARESTHESIA 06/04/2008   MIGRAINE HEADACHE 01/03/2008   ABNORMAL GLUCOSE NEC 08/30/2007   OBESITY 05/17/2007   ANEMIA-NOS 05/17/2007   ASTHMA 05/17/2007   ANEMIA, HX OF 05/17/2007    Past Surgical History:  Procedure Laterality Date   ANKLE SURGERY     COLONOSCOPY WITH PROPOFOL N/A 10/06/2019   Procedure: COLONOSCOPY WITH PROPOFOL;  Surgeon: Ronnette Juniper, MD;  Location: [facility];  Service: Gastroenterology;  Laterality: N/A;   POLYPECTOMY  10/06/2019   Procedure: POLYPECTOMY;  Surgeon: Ronnette Juniper, MD;  Location: [facility];  Service: Gastroenterology;;    OB  History     Gravida  0   Para  0   Term  0   Preterm  0   AB  0   Living  0      SAB  0   IAB  0   Ectopic  0   Multiple  0   Live Births  0            Home Medications    Prior to Admission medications   Medication Sig Start Date End Date Taking? Authorizing Provider  amoxicillin-clavulanate (AUGMENTIN) 875-125 MG tablet Take 1 tablet by mouth 2 (two) times daily for 7 days. 08/31/22 09/07/22 Yes Barrett Henle, MD  ibuprofen (ADVIL) 800 MG tablet Take 1 tablet (800 mg total) by mouth every 8 (eight) hours as needed (pain). 08/31/22  Yes Barrett Henle, MD  albuterol (VENTOLIN HFA) 108 (90 Base) MCG/ACT inhaler Inhale 2 puffs into the lungs every 6 (six) hours as needed for wheezing or shortness of breath. 07/10/22   Glendale Chard, MD  amphetamine-dextroamphetamine (ADDERALL XR) 30 MG 24 hr capsule Take 30 mg by mouth daily. 07/31/22   [provider]  Multiple Vitamin (MULTIVITAMIN) tablet Take 1 tablet by mouth daily.    [provider]  nebivolol (  BYSTOLIC) 10 MG tablet TAKE 1 TABLET(10 MG) BY MOUTH DAILY 05/20/22   Glendale Chard, MD  sertraline (ZOLOFT) 100 MG tablet TAKE 1 TABLET(100 MG) BY MOUTH DAILY Patient taking differently: TAKE 1 TABLET(150 MG) BY MOUTH DAILY 04/12/22   Glendale Chard, MD  valACYclovir (VALTREX) 500 MG tablet Take 1 tablet (500 mg total) by mouth as needed (genital herpes). 07/14/20   Bary Castilla, NP  valsartan (DIOVAN) 160 MG tablet TAKE 1 TABLET(160 MG) BY MOUTH DAILY 04/10/22   Glendale Chard, MD  Vitamin D, Cholecalciferol, 25 MCG (1000 UT) TABS Take 5,000 Units by mouth daily.     [provider]  zaleplon (SONATA) 10 MG capsule Take 10 mg by mouth daily. 07/31/22   [provider]    Family History Family History  Problem Relation Age of Onset   Hypertension Mother    Diabetes Mother    Hyperlipidemia Mother    Hypertension Father    Sleep apnea Father    Alcoholism Father     Obesity Father    Breast cancer Sister    Diabetes Maternal Aunt    Diabetes Maternal Uncle    Heart failure Paternal Aunt    Diabetes Paternal Aunt    Heart failure Maternal Grandmother    Heart failure Maternal Grandfather     Social History Social History   Tobacco Use   Smoking status: Never   Smokeless tobacco: Never  Vaping Use   Vaping Use: Never used  Substance Use Topics   Alcohol use: No    Comment: OCC   Drug use: No     Allergies   Patient has no known allergies.   Review of Systems Review of Systems   Physical Exam Triage Vital Signs ED Triage Vitals [08/31/22 1205]  Enc Vitals Group     BP (!) 161/92     Pulse Rate 66     Resp 17     Temp 97.7 F (36.5 C)     Temp Source Oral     SpO2 92 %     Weight      Height      Head Circumference      Peak Flow      Pain Score 7     Pain Loc      Pain Edu?      Excl. in [place]?    No data found.  Updated Vital Signs BP (!) 161/92 (BP Location: Left Arm)   Pulse 66   Temp 97.7 F (36.5 C) (Oral)   Resp 17   SpO2 92%   Visual Acuity Right Eye Distance:   Left Eye Distance:   Bilateral Distance:    Right Eye Near:   Left Eye Near:    Bilateral Near:     Physical Exam Vitals reviewed.  Constitutional:      General: She is not in acute distress.    Appearance: She is not ill-appearing, toxic-appearing or diaphoretic.  HENT:     Head:     Comments: There is diffuse swelling of the posterior cheek and at the angle of the jaw and just below the angle of the jaw.  This is consistent with swelling of the parotid gland.  It is mildly tender.  No erythema but it is mildly warm.    Nose: Nose normal.     Mouth/Throat:     Mouth: Mucous membranes are moist.     Pharynx: No oropharyngeal exudate or posterior oropharyngeal erythema.  Musculoskeletal:  Cervical back: Neck supple.  Lymphadenopathy:     Cervical: No cervical adenopathy.  Skin:    Coloration: Skin is not jaundiced or pale.   Neurological:     Mental Status: She is alert.      UC Treatments / Results  Labs (all labs ordered are listed, but only abnormal results are displayed) Labs Reviewed - No data to display  EKG   Radiology No results found.  Procedures Procedures (including critical care time)  Medications Ordered in UC Medications  ketorolac (TORADOL) 15 MG/ML injection 30 mg (has no administration in time range)    Initial Impression / Assessment and Plan / UC Course  I have reviewed the triage vital signs and the nursing notes.  Pertinent labs & imaging results that were available during my care of the patient were reviewed by me and considered in my medical decision making (see chart for details).       Last EGFR in late 2023 was 39  Discussed with her that this is a swelling of her parotid gland, and could mean that there is a salivary gland duct stone.  Augmentin is sent in for possible cellulitis/infection, and she is treated with ibuprofen and Toradol also Final Clinical Impressions(s) / UC Diagnoses   Final diagnoses:  Parotid gland enlargement     Discharge Instructions      You have been given a shot of Toradol 30 mg today.  Take amoxicillin-clavulanate 875 mg--1 tab twice daily with food for 7 days  Take ibuprofen 800 mg--1 tab every 8 hours as needed for pain.  Warm compresses can help.       ED Prescriptions     Medication Sig Dispense Auth. Provider   amoxicillin-clavulanate (AUGMENTIN) 875-125 MG tablet Take 1 tablet by mouth 2 (two) times daily for 7 days. 14 tablet Jerlean Peralta, Gwenlyn Perking, MD   ibuprofen (ADVIL) 800 MG tablet Take 1 tablet (800 mg total) by mouth every 8 (eight) hours as needed (pain). 21 tablet Janeya Deyo, Gwenlyn Perking, MD      I have reviewed the PDMP during this encounter.   Barrett Henle, MD 08/31/22 1221    Barrett Henle, MD 08/31/22 [phone]

## 2022-09-01 ENCOUNTER — Encounter: Payer: Self-pay | Admitting: Internal Medicine

## 2022-09-11 ENCOUNTER — Encounter: Payer: Self-pay | Admitting: Internal Medicine

## 2022-09-12 ENCOUNTER — Ambulatory Visit: Payer: BC Managed Care – PPO

## 2022-09-12 ENCOUNTER — Other Ambulatory Visit: Payer: Self-pay | Admitting: Internal Medicine

## 2022-09-12 VITALS — BP 138/100 | HR 65 | Temp 98.5°F | Ht 63.0 in | Wt 296.0 lb

## 2022-09-12 DIAGNOSIS — I10 Essential (primary) hypertension: Secondary | ICD-10-CM

## 2022-09-12 MED ORDER — HYDROCHLOROTHIAZIDE 12.5 MG PO CAPS: 12.5000 mg | ORAL_CAPSULE | Freq: Every day | ORAL | 1 refills | Status: DC

## 2022-09-12 MED ORDER — HYDROCHLOROTHIAZIDE 12.5 MG PO CAPS: 12.5000 mg | ORAL_CAPSULE | Freq: Every day | ORAL | 2 refills | Status: DC

## 2022-09-12 NOTE — Patient Instructions (Signed)
Hypertension, Adult ?Hypertension is another name for high blood pressure. High blood pressure forces your heart to work harder to pump blood. This can cause problems over time. ?There are two numbers in a blood pressure reading. There is a top number (systolic) over a bottom number (diastolic). It is best to have a blood pressure that is below 120/80. ?What are the causes? ?The cause of this condition is not known. Some other conditions can lead to high blood pressure. ?What increases the risk? ?Some lifestyle factors can make you more likely to develop high blood pressure: ?Smoking. ?Not getting enough exercise or physical activity. ?Being overweight. ?Having too much fat, sugar, calories, or salt (sodium) in your diet. ?Drinking too much alcohol. ?Other risk factors include: ?Having any of these conditions: ?Heart disease. ?Diabetes. ?High cholesterol. ?Kidney disease. ?Obstructive sleep apnea. ?Having a family history of high blood pressure and high cholesterol. ?Age. The risk increases with age. ?Stress. ?What are the signs or symptoms? ?High blood pressure may not cause symptoms. Very high blood pressure (hypertensive crisis) may cause: ?Headache. ?Fast or uneven heartbeats (palpitations). ?Shortness of breath. ?Nosebleed. ?Vomiting or feeling like you may vomit (nauseous). ?Changes in how you see. ?Very bad chest pain. ?Feeling dizzy. ?Seizures. ?How is this treated? ?This condition is treated by making healthy lifestyle changes, such as: ?Eating healthy foods. ?Exercising more. ?Drinking less alcohol. ?Your doctor may prescribe medicine if lifestyle changes do not help enough and if: ?Your top number is above 130. ?Your bottom number is above 80. ?Your personal target blood pressure may vary. ?Follow these instructions at home: ?Eating and drinking ? ?If told, follow the DASH eating plan. To follow this plan: ?Fill one half of your plate at each meal with fruits and vegetables. ?Fill one fourth of your plate  at each meal with whole grains. Whole grains include whole-wheat pasta, brown rice, and whole-grain bread. ?Eat or drink low-fat dairy products, such as skim milk or low-fat yogurt. ?Fill one fourth of your plate at each meal with low-fat (lean) proteins. Low-fat proteins include fish, chicken without skin, eggs, beans, and tofu. ?Avoid fatty meat, cured and processed meat, or chicken with skin. ?Avoid pre-made or processed food. ?Limit the amount of salt in your diet to less than 1,500 mg each day. ?Do not drink alcohol if: ?Your doctor tells you not to drink. ?You are pregnant, may be pregnant, or are planning to become pregnant. ?If you drink alcohol: ?Limit how much you have to: ?0-1 drink a day for women. ?0-2 drinks a day for men. ?Know how much alcohol is in your drink. In the [place] one drink equals one 12 oz bottle of beer (355 mL), one 5 oz glass of wine (148 mL), or one 1? oz glass of hard liquor (44 mL). ?Lifestyle ? ?Work with your doctor to stay at a healthy weight or to lose weight. Ask your doctor what the best weight is for you. ?Get at least 30 minutes of exercise that causes your heart to beat faster (aerobic exercise) most days of the week. This may include walking, swimming, or biking. ?Get at least 30 minutes of exercise that strengthens your muscles (resistance exercise) at least 3 days a week. This may include lifting weights or doing Pilates. ?Do not smoke or use any products that contain nicotine or tobacco. If you need help quitting, ask your doctor. ?Check your blood pressure at home as told by your doctor. ?Keep all follow-up visits. ?Medicines ?Take over-the-counter and prescription medicines   only as told by your doctor. Follow directions carefully. ?Do not skip doses of blood pressure medicine. The medicine does not work as well if you skip doses. Skipping doses also puts you at risk for problems. ?Ask your doctor about side effects or reactions to medicines that you should watch  for. ?Contact a doctor if: ?You think you are having a reaction to the medicine you are taking. ?You have headaches that keep coming back. ?You feel dizzy. ?You have swelling in your ankles. ?You have trouble with your vision. ?Get help right away if: ?You get a very bad headache. ?You start to feel mixed up (confused). ?You feel weak or numb. ?You feel faint. ?You have very bad pain in your: ?Chest. ?Belly (abdomen). ?You vomit more than once. ?You have trouble breathing. ?These symptoms may be an emergency. Get help right away. Call 911. ?Do not wait to see if the symptoms will go away. ?Do not drive yourself to the hospital. ?Summary ?Hypertension is another name for high blood pressure. ?High blood pressure forces your heart to work harder to pump blood. ?For most people, a normal blood pressure is less than 120/80. ?Making healthy choices can help lower blood pressure. If your blood pressure does not get lower with healthy choices, you may need to take medicine. ?This information is not intended to replace advice given to you by your health care provider. Make sure you discuss any questions you have with your health care provider. ?Document Revised: 03/31/2021 Document Reviewed: 03/31/2021 ?Elsevier Patient Education ? 2023 Elsevier Inc. ? ?

## 2022-09-12 NOTE — Progress Notes (Signed)
Patient presents today for BP check, patient currently taking nebivolol 10mg , valsartan 160mg .  BP Readings from Last 3 Encounters:  09/12/22 (!) 142/90  08/31/22 (!) 161/92  08/08/22 126/88  Per provider- send hctz 12.5mg  , see her in two weeks for nurse visit, put in future BMP , sh ewill take hctz w/ valsartan in am, continue with nebivolol in the evenings

## 2022-09-14 ENCOUNTER — Other Ambulatory Visit: Payer: Self-pay

## 2022-09-14 DIAGNOSIS — I10 Essential (primary) hypertension: Secondary | ICD-10-CM

## 2022-09-14 MED ORDER — HYDROCHLOROTHIAZIDE 12.5 MG PO CAPS: 12.5000 mg | ORAL_CAPSULE | Freq: Every day | ORAL | 2 refills | Status: DC

## 2022-09-26 ENCOUNTER — Ambulatory Visit: Payer: BC Managed Care – PPO

## 2022-09-26 VITALS — BP 124/70 | HR 72 | Temp 98.5°F | Ht 63.0 in | Wt 296.0 lb

## 2022-09-26 DIAGNOSIS — I10 Essential (primary) hypertension: Secondary | ICD-10-CM

## 2022-09-26 NOTE — Patient Instructions (Signed)
Hypertension, Adult ?Hypertension is another name for high blood pressure. High blood pressure forces your heart to work harder to pump blood. This can cause problems over time. ?There are two numbers in a blood pressure reading. There is a top number (systolic) over a bottom number (diastolic). It is best to have a blood pressure that is below 120/80. ?What are the causes? ?The cause of this condition is not known. Some other conditions can lead to high blood pressure. ?What increases the risk? ?Some lifestyle factors can make you more likely to develop high blood pressure: ?Smoking. ?Not getting enough exercise or physical activity. ?Being overweight. ?Having too much fat, sugar, calories, or salt (sodium) in your diet. ?Drinking too much alcohol. ?Other risk factors include: ?Having any of these conditions: ?Heart disease. ?Diabetes. ?High cholesterol. ?Kidney disease. ?Obstructive sleep apnea. ?Having a family history of high blood pressure and high cholesterol. ?Age. The risk increases with age. ?Stress. ?What are the signs or symptoms? ?High blood pressure may not cause symptoms. Very high blood pressure (hypertensive crisis) may cause: ?Headache. ?Fast or uneven heartbeats (palpitations). ?Shortness of breath. ?Nosebleed. ?Vomiting or feeling like you may vomit (nauseous). ?Changes in how you see. ?Very bad chest pain. ?Feeling dizzy. ?Seizures. ?How is this treated? ?This condition is treated by making healthy lifestyle changes, such as: ?Eating healthy foods. ?Exercising more. ?Drinking less alcohol. ?Your doctor may prescribe medicine if lifestyle changes do not help enough and if: ?Your top number is above 130. ?Your bottom number is above 80. ?Your personal target blood pressure may vary. ?Follow these instructions at home: ?Eating and drinking ? ?If told, follow the DASH eating plan. To follow this plan: ?Fill one half of your plate at each meal with fruits and vegetables. ?Fill one fourth of your plate  at each meal with whole grains. Whole grains include whole-wheat pasta, brown rice, and whole-grain bread. ?Eat or drink low-fat dairy products, such as skim milk or low-fat yogurt. ?Fill one fourth of your plate at each meal with low-fat (lean) proteins. Low-fat proteins include fish, chicken without skin, eggs, beans, and tofu. ?Avoid fatty meat, cured and processed meat, or chicken with skin. ?Avoid pre-made or processed food. ?Limit the amount of salt in your diet to less than 1,500 mg each day. ?Do not drink alcohol if: ?Your doctor tells you not to drink. ?You are pregnant, may be pregnant, or are planning to become pregnant. ?If you drink alcohol: ?Limit how much you have to: ?0-1 drink a day for women. ?0-2 drinks a day for men. ?Know how much alcohol is in your drink. In the [place] one drink equals one 12 oz bottle of beer (355 mL), one 5 oz glass of wine (148 mL), or one 1? oz glass of hard liquor (44 mL). ?Lifestyle ? ?Work with your doctor to stay at a healthy weight or to lose weight. Ask your doctor what the best weight is for you. ?Get at least 30 minutes of exercise that causes your heart to beat faster (aerobic exercise) most days of the week. This may include walking, swimming, or biking. ?Get at least 30 minutes of exercise that strengthens your muscles (resistance exercise) at least 3 days a week. This may include lifting weights or doing Pilates. ?Do not smoke or use any products that contain nicotine or tobacco. If you need help quitting, ask your doctor. ?Check your blood pressure at home as told by your doctor. ?Keep all follow-up visits. ?Medicines ?Take over-the-counter and prescription medicines   only as told by your doctor. Follow directions carefully. ?Do not skip doses of blood pressure medicine. The medicine does not work as well if you skip doses. Skipping doses also puts you at risk for problems. ?Ask your doctor about side effects or reactions to medicines that you should watch  for. ?Contact a doctor if: ?You think you are having a reaction to the medicine you are taking. ?You have headaches that keep coming back. ?You feel dizzy. ?You have swelling in your ankles. ?You have trouble with your vision. ?Get help right away if: ?You get a very bad headache. ?You start to feel mixed up (confused). ?You feel weak or numb. ?You feel faint. ?You have very bad pain in your: ?Chest. ?Belly (abdomen). ?You vomit more than once. ?You have trouble breathing. ?These symptoms may be an emergency. Get help right away. Call 911. ?Do not wait to see if the symptoms will go away. ?Do not drive yourself to the hospital. ?Summary ?Hypertension is another name for high blood pressure. ?High blood pressure forces your heart to work harder to pump blood. ?For most people, a normal blood pressure is less than 120/80. ?Making healthy choices can help lower blood pressure. If your blood pressure does not get lower with healthy choices, you may need to take medicine. ?This information is not intended to replace advice given to you by your health care provider. Make sure you discuss any questions you have with your health care provider. ?Document Revised: 03/31/2021 Document Reviewed: 03/31/2021 ?Elsevier Patient Education ? 2023 Elsevier Inc. ? ?

## 2022-09-26 NOTE — Progress Notes (Signed)
Patient presents today for BP check, patient currently taking HCTZ 12.5mg  AM, Valsartan 160mg  AM, Nebivolol 10mg  PM. BP Readings from Last 3 Encounters:  09/26/22 124/70  09/12/22 (!) 138/100  08/31/22 (!) 161/92  PER PROVIDER- tell her congrat s and exercise at least 30 min five days per wk.

## 2022-11-06 ENCOUNTER — Other Ambulatory Visit: Payer: Self-pay | Admitting: Internal Medicine

## 2022-11-06 DIAGNOSIS — I10 Essential (primary) hypertension: Secondary | ICD-10-CM

## 2022-11-06 MED ORDER — NEBIVOLOL HCL 10 MG PO TABS
10.0000 mg | ORAL_TABLET | Freq: Every day | ORAL | 0 refills | Status: DC
Start: 2022-11-06 — End: 2023-02-12

## 2022-12-01 ENCOUNTER — Ambulatory Visit: Payer: BC Managed Care – PPO | Admitting: Internal Medicine

## 2022-12-01 ENCOUNTER — Encounter: Payer: Self-pay | Admitting: Internal Medicine

## 2022-12-01 VITALS — BP 140/90 | HR 85 | Temp 97.8°F | Ht 63.0 in | Wt 293.4 lb

## 2022-12-01 DIAGNOSIS — R7309 Other abnormal glucose: Secondary | ICD-10-CM

## 2022-12-01 DIAGNOSIS — I10 Essential (primary) hypertension: Secondary | ICD-10-CM

## 2022-12-01 DIAGNOSIS — Z803 Family history of malignant neoplasm of breast: Secondary | ICD-10-CM

## 2022-12-01 DIAGNOSIS — Z6841 Body Mass Index (BMI) 40.0 and over, adult: Secondary | ICD-10-CM

## 2022-12-01 DIAGNOSIS — F988 Other specified behavioral and emotional disorders with onset usually occurring in childhood and adolescence: Secondary | ICD-10-CM

## 2022-12-01 DIAGNOSIS — Z8049 Family history of malignant neoplasm of other genital organs: Secondary | ICD-10-CM

## 2022-12-01 NOTE — Patient Instructions (Signed)
Hypertension, Adult Hypertension is another name for high blood pressure. High blood pressure forces your heart to work harder to pump blood. This can cause problems over time. There are two numbers in a blood pressure reading. There is a top number (systolic) over a bottom number (diastolic). It is best to have a blood pressure that is below 120/80. What are the causes? The cause of this condition is not known. Some other conditions can lead to high blood pressure. What increases the risk? Some lifestyle factors can make you more likely to develop high blood pressure: Smoking. Not getting enough exercise or physical activity. Being overweight. Having too much fat, sugar, calories, or salt (sodium) in your diet. Drinking too much alcohol. Other risk factors include: Having any of these conditions: Heart disease. Diabetes. High cholesterol. Kidney disease. Obstructive sleep apnea. Having a family history of high blood pressure and high cholesterol. Age. The risk increases with age. Stress. What are the signs or symptoms? High blood pressure may not cause symptoms. Very high blood pressure (hypertensive crisis) may cause: Headache. Fast or uneven heartbeats (palpitations). Shortness of breath. Nosebleed. Vomiting or feeling like you may vomit (nauseous). Changes in how you see. Very bad chest pain. Feeling dizzy. Seizures. How is this treated? This condition is treated by making healthy lifestyle changes, such as: Eating healthy foods. Exercising more. Drinking less alcohol. Your doctor may prescribe medicine if lifestyle changes do not help enough and if: Your top number is above 130. Your bottom number is above 80. Your personal target blood pressure may vary. Follow these instructions at home: Eating and drinking  If told, follow the DASH eating plan. To follow this plan: Fill one half of your plate at each meal with fruits and vegetables. Fill one fourth of your plate  at each meal with whole grains. Whole grains include whole-wheat pasta, brown rice, and whole-grain bread. Eat or drink low-fat dairy products, such as skim milk or low-fat yogurt. Fill one fourth of your plate at each meal with low-fat (lean) proteins. Low-fat proteins include fish, chicken without skin, eggs, beans, and tofu. Avoid fatty meat, cured and processed meat, or chicken with skin. Avoid pre-made or processed food. Limit the amount of salt in your diet to less than 1,500 mg each day. Do not drink alcohol if: Your doctor tells you not to drink. You are pregnant, may be pregnant, or are planning to become pregnant. If you drink alcohol: Limit how much you have to: 0-1 drink a day for women. 0-2 drinks a day for men. Know how much alcohol is in your drink. In the [place] one drink equals one 12 oz bottle of beer (355 mL), one 5 oz glass of wine (148 mL), or one 1 oz glass of hard liquor (44 mL). Lifestyle  Work with your doctor to stay at a healthy weight or to lose weight. Ask your doctor what the best weight is for you. Get at least 30 minutes of exercise that causes your heart to beat faster (aerobic exercise) most days of the week. This may include walking, swimming, or biking. Get at least 30 minutes of exercise that strengthens your muscles (resistance exercise) at least 3 days a week. This may include lifting weights or doing Pilates. Do not smoke or use any products that contain nicotine or tobacco. If you need help quitting, ask your doctor. Check your blood pressure at home as told by your doctor. Keep all follow-up visits. Medicines Take over-the-counter and prescription medicines   only as told by your doctor. Follow directions carefully. Do not skip doses of blood pressure medicine. The medicine does not work as well if you skip doses. Skipping doses also puts you at risk for problems. Ask your doctor about side effects or reactions to medicines that you should watch  for. Contact a doctor if: You think you are having a reaction to the medicine you are taking. You have headaches that keep coming back. You feel dizzy. You have swelling in your ankles. You have trouble with your vision. Get help right away if: You get a very bad headache. You start to feel mixed up (confused). You feel weak or numb. You feel faint. You have very bad pain in your: Chest. Belly (abdomen). You vomit more than once. You have trouble breathing. These symptoms may be an emergency. Get help right away. Call 911. Do not wait to see if the symptoms will go away. Do not drive yourself to the hospital. Summary Hypertension is another name for high blood pressure. High blood pressure forces your heart to work harder to pump blood. For most people, a normal blood pressure is less than 120/80. Making healthy choices can help lower blood pressure. If your blood pressure does not get lower with healthy choices, you may need to take medicine. This information is not intended to replace advice given to you by your health care provider. Make sure you discuss any questions you have with your health care provider. Document Revised: 03/31/2021 Document Reviewed: 03/31/2021 Elsevier Patient Education  2024 Elsevier Inc.  

## 2022-12-01 NOTE — Progress Notes (Unsigned)
Pura Spice as a Neurosurgeon for [name], MD.,have documented all relevant documentation on the behalf of [name], MD,as directed by  [name], MD while in the presence of [name], MD.  Subjective:  Patient ID: Brooke [name] , female    DOB: December 26, 1970 , 52 y.o.   MRN: [id number]  Chief Complaint  Patient presents with   Hypertension    HPI  Patient presents today for a BP and ADD check.  She reports compliance with medications. She states she did a lot of running around today, thinks this has contributed to her elevated blood pressure. She has a sister at [facility] who just had surgery for endometrial cancer. She has been caring for her child with special needs. She has been up/down the highway more frequently than usual.    BP Readings from Last 3 Encounters: 12/01/22 : (!) 140/72 09/26/22 : 124/70 09/12/22 : (!) 138/100    Hypertension This is a chronic problem. The current episode started more than 1 year ago. The problem has been gradually improving since onset. The problem is controlled. Pertinent negatives include no blurred vision, chest pain, palpitations or shortness of breath. Risk factors for coronary artery disease include obesity. Past treatments include diuretics. The current treatment provides mild improvement. Compliance problems include exercise.      Past Medical History:  Diagnosis Date   ADD (attention deficit disorder)    Allergy    allergic rhinitis   Anemia    nos   Anxiety    Arthritis    right ankle   Asthma    as a child   Back pain    Constipation    Depression    Elevated blood pressure reading without diagnosis of hypertension    Genital herpes    Lactose intolerance    Migraine    Prediabetes    Sleep apnea    Stress    Swelling      Family History  Problem Relation Age of Onset   Hypertension Mother    Diabetes Mother    Hyperlipidemia Mother    Hypertension Father    Sleep apnea  Father    Alcoholism Father    Obesity Father    Breast cancer Sister    Diabetes Maternal Aunt    Diabetes Maternal Uncle    Heart failure Paternal Aunt    Diabetes Paternal Aunt    Heart failure Maternal Grandmother    Heart failure Maternal Grandfather      Current Outpatient Medications:    albuterol (VENTOLIN HFA) 108 (90 Base) MCG/ACT inhaler, Inhale 2 puffs into the lungs every 6 (six) hours as needed for wheezing or shortness of breath., Disp: 8 g, Rfl: 2   amphetamine-dextroamphetamine (ADDERALL) 30 MG tablet, Take 30 mg by mouth daily. Once daily., Disp: , Rfl:    hydrochlorothiazide (MICROZIDE) 12.5 MG capsule, Take 1 capsule (12.5 mg total) by mouth daily., Disp: 90 capsule, Rfl: 2   Multiple Vitamin (MULTIVITAMIN) tablet, Take 1 tablet by mouth daily., Disp: , Rfl:    nebivolol (BYSTOLIC) 10 MG tablet, Take 1 tablet (10 mg total) by mouth daily., Disp: 90 tablet, Rfl: 0   sertraline (ZOLOFT) 100 MG tablet, TAKE 1 TABLET(100 MG) BY MOUTH DAILY (Patient taking differently: TAKE 1 TABLET(150 MG) BY MOUTH DAILY), Disp: 90 tablet, Rfl: 1   valACYclovir (VALTREX) 500 MG tablet, Take 1 tablet (500 mg total) by mouth as needed (genital herpes)., Disp:  90 tablet, Rfl: 0   valsartan (DIOVAN) 160 MG tablet, TAKE 1 TABLET(160 MG) BY MOUTH DAILY, Disp: 30 tablet, Rfl: 11   Vitamin D, Cholecalciferol, 25 MCG (1000 UT) TABS, Take 5,000 Units by mouth daily. , Disp: , Rfl:    zaleplon (SONATA) 10 MG capsule, Take 10 mg by mouth daily., Disp: , Rfl:    No Known Allergies   Review of Systems  Constitutional: Negative.   HENT: Negative.    Eyes: Negative.  Negative for blurred vision.  Respiratory: Negative.  Negative for shortness of breath.   Cardiovascular: Negative.  Negative for chest pain and palpitations.  Gastrointestinal: Negative.      Today's Vitals   12/01/22 1524 12/01/22 1552  BP: (!) 140/72 (!) 140/90  Pulse: 85   Temp: 97.8 F (36.6 C)   TempSrc: Oral   Weight:  293 lb 6.4 oz (133.1 kg)   Height: 5\' 3"  (1.6 m)   PainSc: 0-No pain    Body mass index is 51.97 kg/m.  Wt Readings from Last 3 Encounters:  12/01/22 293 lb 6.4 oz (133.1 kg)  09/26/22 296 lb (134.3 kg)  09/12/22 296 lb (134.3 kg)    BP Readings from Last 3 Encounters:  12/01/22 (!) 140/90  09/26/22 124/70  09/12/22 (!) 138/100     The 10-year ASCVD risk score (Arnett DK, et al., 2019) is: 4.9%   Values used to calculate the score:     Age: 41 years     Sex: Female     Is Non-Hispanic African American: Yes     Diabetic: No     Tobacco smoker: No     Systolic Blood Pressure: 140 mmHg     Is BP treated: Yes     HDL Cholesterol: 69 mg/dL     Total Cholesterol: 234 mg/dL  Objective:  Physical Exam Vitals and nursing note reviewed.  Constitutional:      Appearance: Normal appearance. She is obese.  HENT:     Head: Normocephalic and atraumatic.  Eyes:     Extraocular Movements: Extraocular movements intact.  Cardiovascular:     Rate and Rhythm: Normal rate and regular rhythm.     Heart sounds: Normal heart sounds.  Pulmonary:     Effort: Pulmonary effort is normal.     Breath sounds: Normal breath sounds.  Musculoskeletal:     Cervical back: Normal range of motion.  Skin:    General: Skin is warm.  Neurological:     General: No focal deficit present.     Mental Status: She is alert.  Psychiatric:        Mood and Affect: Mood normal.        Behavior: Behavior normal.       Assessment And Plan:  1. Essential hypertension Comments: Uncontrolled. I planned to add amlodipine; however, she prefers to do NV in 2 weeks. If needed, will add amlodipine 2.5mg  nightly. - CMP14+EGFR  2. ADD (attention deficit disorder) without hyperactivity Comments: Chronic, now followed by Psych. They have assumed refills. She will c/w Adderall 30mg  daily.  3. Other abnormal glucose Comments: Previous labs reviewed, her a1c has been elevated in the past. I will recheck an a1c today.  Encouraged to limit her intake of sugary beverages/foods. - Hemoglobin A1c  4. Class 3 severe obesity due to excess calories with serious comorbidity and body mass index (BMI) of 50.0 to 59.9 in adult [facility]) Comments: BMI 51. She is encouraged to aim for at least 150  minutes of exercise/week. She agrees to renew her [employer]  She does not wish to renew the program at [facility]. Also, she did not have much results with [facility].  Unable to consider phentermine at this time due to her elevated BP. She has no insurance coverage for GLP-1 agonist therapy.   5. Family history of breast cancer Comments: Sister, maternal aunt and paternal aunt.  She agrees to [employer] evaluation. - Ambulatory referral to Genetics  6. Family history of cancer of genitourinary system Comments: Sister w/ endometrial cancer. She agrees to referral for genetic testing. - Ambulatory referral to Genetics    Return in 18 days (on 12/19/2022), or NV - Tuesday appt, for 3 months bp check.  Patient was given opportunity to ask questions. Patient verbalized understanding of the plan and was able to repeat key elements of the plan. All questions were answered to their satisfaction.   I, [name], MD, have reviewed all documentation for this visit. The documentation on 12/01/22 for the exam, diagnosis, procedures, and orders are all accurate and complete.   IF YOU HAVE BEEN REFERRED TO A SPECIALIST, IT MAY TAKE 1-2 WEEKS TO SCHEDULE/PROCESS THE REFERRAL. IF YOU HAVE NOT HEARD FROM US/SPECIALIST IN TWO WEEKS, PLEASE GIVE [place] A CALL AT [phone].

## 2022-12-02 LAB — CMP14+EGFR
ALT: 18 IU/L (ref 0–32)
AST: 23 IU/L (ref 0–40)
Albumin/Globulin Ratio: 1.2 (ref 1.2–2.2)
Albumin: 4 g/dL (ref 3.8–4.9)
Alkaline Phosphatase: 127 IU/L — ABNORMAL HIGH (ref 44–121)
BUN/Creatinine Ratio: 15 (ref 9–23)
BUN: 11 mg/dL (ref 6–24)
Bilirubin Total: <0.2 mg/dL (ref 0.0–1.2)
CO2: 29 mmol/L (ref 20–29)
Calcium: 9.4 mg/dL (ref 8.7–10.2)
Chloride: 99 mmol/L (ref 96–106)
Creatinine, Ser: 0.74 mg/dL (ref 0.57–1.00)
Globulin, Total: 3.3 g/dL (ref 1.5–4.5)
Glucose: 85 mg/dL (ref 70–99)
Potassium: 4.1 mmol/L (ref 3.5–5.2)
Sodium: 141 mmol/L (ref 134–144)
Total Protein: 7.3 g/dL (ref 6.0–8.5)
eGFR: 97 mL/min/{1.73_m2} (ref >59)

## 2022-12-02 LAB — HEMOGLOBIN A1C
Est. average glucose Bld gHb Est-mCnc: 123 mg/dL
Hgb A1c MFr Bld: 5.9 % — ABNORMAL HIGH (ref 4.8–5.6)

## 2022-12-05 ENCOUNTER — Telehealth: Payer: Self-pay | Admitting: Genetic Counselor

## 2022-12-05 NOTE — Telephone Encounter (Signed)
Patient is aware of upcoming appointment time/dates 

## 2022-12-14 ENCOUNTER — Ambulatory Visit: Payer: BC Managed Care – PPO | Admitting: Internal Medicine

## 2022-12-15 ENCOUNTER — Encounter: Payer: Self-pay | Admitting: Genetic Counselor

## 2022-12-18 ENCOUNTER — Other Ambulatory Visit: Payer: Self-pay | Admitting: Internal Medicine

## 2022-12-18 DIAGNOSIS — Z1231 Encounter for screening mammogram for malignant neoplasm of breast: Secondary | ICD-10-CM

## 2022-12-19 ENCOUNTER — Ambulatory Visit: Payer: BC Managed Care – PPO

## 2022-12-19 VITALS — BP 134/100 | HR 70 | Temp 98.4°F | Ht 63.0 in | Wt 293.0 lb

## 2022-12-19 DIAGNOSIS — I10 Essential (primary) hypertension: Secondary | ICD-10-CM

## 2022-12-19 MED ORDER — AMLODIPINE BESYLATE 2.5 MG PO TABS
2.5000 mg | ORAL_TABLET | Freq: Every day | ORAL | 11 refills | Status: DC
Start: 2022-12-19 — End: 2023-02-16

## 2022-12-19 NOTE — Progress Notes (Signed)
Patient presents today for a bp check. Patient is currently taking valsartan 160mg  daily and hydrochlorothiazide 12.5mg  and bystolic 10mg  at night. Today her bp was 140/90 I waited 10 mins and it was 134/100.  After speaking with provider she is to take amlodipine 2.5mg  at night and follow up in 2 weeks.   BP Readings from Last 3 Encounters:  12/01/22 (!) 140/90  09/26/22 124/70  09/12/22 (!) 138/100

## 2022-12-19 NOTE — Patient Instructions (Signed)
Hypertension, Adult Hypertension is another name for high blood pressure. High blood pressure forces your heart to work harder to pump blood. This can cause problems over time. There are two numbers in a blood pressure reading. There is a top number (systolic) over a bottom number (diastolic). It is best to have a blood pressure that is below 120/80. What are the causes? The cause of this condition is not known. Some other conditions can lead to high blood pressure. What increases the risk? Some lifestyle factors can make you more likely to develop high blood pressure: Smoking. Not getting enough exercise or physical activity. Being overweight. Having too much fat, sugar, calories, or salt (sodium) in your diet. Drinking too much alcohol. Other risk factors include: Having any of these conditions: Heart disease. Diabetes. High cholesterol. Kidney disease. Obstructive sleep apnea. Having a family history of high blood pressure and high cholesterol. Age. The risk increases with age. Stress. What are the signs or symptoms? High blood pressure may not cause symptoms. Very high blood pressure (hypertensive crisis) may cause: Headache. Fast or uneven heartbeats (palpitations). Shortness of breath. Nosebleed. Vomiting or feeling like you may vomit (nauseous). Changes in how you see. Very bad chest pain. Feeling dizzy. Seizures. How is this treated? This condition is treated by making healthy lifestyle changes, such as: Eating healthy foods. Exercising more. Drinking less alcohol. Your doctor may prescribe medicine if lifestyle changes do not help enough and if: Your top number is above 130. Your bottom number is above 80. Your personal target blood pressure may vary. Follow these instructions at home: Eating and drinking  If told, follow the DASH eating plan. To follow this plan: Fill one half of your plate at each meal with fruits and vegetables. Fill one fourth of your plate  at each meal with whole grains. Whole grains include whole-wheat pasta, brown rice, and whole-grain bread. Eat or drink low-fat dairy products, such as skim milk or low-fat yogurt. Fill one fourth of your plate at each meal with low-fat (lean) proteins. Low-fat proteins include fish, chicken without skin, eggs, beans, and tofu. Avoid fatty meat, cured and processed meat, or chicken with skin. Avoid pre-made or processed food. Limit the amount of salt in your diet to less than 1,500 mg each day. Do not drink alcohol if: Your doctor tells you not to drink. You are pregnant, may be pregnant, or are planning to become pregnant. If you drink alcohol: Limit how much you have to: 0-1 drink a day for women. 0-2 drinks a day for men. Know how much alcohol is in your drink. In the [place] one drink equals one 12 oz bottle of beer (355 mL), one 5 oz glass of wine (148 mL), or one 1 oz glass of hard liquor (44 mL). Lifestyle  Work with your doctor to stay at a healthy weight or to lose weight. Ask your doctor what the best weight is for you. Get at least 30 minutes of exercise that causes your heart to beat faster (aerobic exercise) most days of the week. This may include walking, swimming, or biking. Get at least 30 minutes of exercise that strengthens your muscles (resistance exercise) at least 3 days a week. This may include lifting weights or doing Pilates. Do not smoke or use any products that contain nicotine or tobacco. If you need help quitting, ask your doctor. Check your blood pressure at home as told by your doctor. Keep all follow-up visits. Medicines Take over-the-counter and prescription medicines   only as told by your doctor. Follow directions carefully. Do not skip doses of blood pressure medicine. The medicine does not work as well if you skip doses. Skipping doses also puts you at risk for problems. Ask your doctor about side effects or reactions to medicines that you should watch  for. Contact a doctor if: You think you are having a reaction to the medicine you are taking. You have headaches that keep coming back. You feel dizzy. You have swelling in your ankles. You have trouble with your vision. Get help right away if: You get a very bad headache. You start to feel mixed up (confused). You feel weak or numb. You feel faint. You have very bad pain in your: Chest. Belly (abdomen). You vomit more than once. You have trouble breathing. These symptoms may be an emergency. Get help right away. Call 911. Do not wait to see if the symptoms will go away. Do not drive yourself to the hospital. Summary Hypertension is another name for high blood pressure. High blood pressure forces your heart to work harder to pump blood. For most people, a normal blood pressure is less than 120/80. Making healthy choices can help lower blood pressure. If your blood pressure does not get lower with healthy choices, you may need to take medicine. This information is not intended to replace advice given to you by your health care provider. Make sure you discuss any questions you have with your health care provider. Document Revised: 03/31/2021 Document Reviewed: 03/31/2021 Elsevier Patient Education  2024 Elsevier Inc.  

## 2022-12-21 ENCOUNTER — Telehealth: Payer: Self-pay | Admitting: Licensed Clinical Social Worker

## 2022-12-21 ENCOUNTER — Inpatient Hospital Stay: Payer: BC Managed Care – PPO | Admitting: Genetic Counselor

## 2022-12-21 ENCOUNTER — Inpatient Hospital Stay: Payer: BC Managed Care – PPO

## 2022-12-21 NOTE — Telephone Encounter (Signed)
Scheduled per 6/21 sch msg, message has been left with pt

## 2023-01-02 ENCOUNTER — Ambulatory Visit: Payer: BC Managed Care – PPO

## 2023-01-02 VITALS — BP 134/90 | HR 78 | Temp 98.1°F | Ht 63.0 in | Wt 293.0 lb

## 2023-01-02 DIAGNOSIS — I10 Essential (primary) hypertension: Secondary | ICD-10-CM

## 2023-01-02 NOTE — Patient Instructions (Signed)
Hypertension, Adult Hypertension is another name for high blood pressure. High blood pressure forces your heart to work harder to pump blood. This can cause problems over time. There are two numbers in a blood pressure reading. There is a top number (systolic) over a bottom number (diastolic). It is best to have a blood pressure that is below 120/80. What are the causes? The cause of this condition is not known. Some other conditions can lead to high blood pressure. What increases the risk? Some lifestyle factors can make you more likely to develop high blood pressure: Smoking. Not getting enough exercise or physical activity. Being overweight. Having too much fat, sugar, calories, or salt (sodium) in your diet. Drinking too much alcohol. Other risk factors include: Having any of these conditions: Heart disease. Diabetes. High cholesterol. Kidney disease. Obstructive sleep apnea. Having a family history of high blood pressure and high cholesterol. Age. The risk increases with age. Stress. What are the signs or symptoms? High blood pressure may not cause symptoms. Very high blood pressure (hypertensive crisis) may cause: Headache. Fast or uneven heartbeats (palpitations). Shortness of breath. Nosebleed. Vomiting or feeling like you may vomit (nauseous). Changes in how you see. Very bad chest pain. Feeling dizzy. Seizures. How is this treated? This condition is treated by making healthy lifestyle changes, such as: Eating healthy foods. Exercising more. Drinking less alcohol. Your doctor may prescribe medicine if lifestyle changes do not help enough and if: Your top number is above 130. Your bottom number is above 80. Your personal target blood pressure may vary. Follow these instructions at home: Eating and drinking  If told, follow the DASH eating plan. To follow this plan: Fill one half of your plate at each meal with fruits and vegetables. Fill one fourth of your plate  at each meal with whole grains. Whole grains include whole-wheat pasta, brown rice, and whole-grain bread. Eat or drink low-fat dairy products, such as skim milk or low-fat yogurt. Fill one fourth of your plate at each meal with low-fat (lean) proteins. Low-fat proteins include fish, chicken without skin, eggs, beans, and tofu. Avoid fatty meat, cured and processed meat, or chicken with skin. Avoid pre-made or processed food. Limit the amount of salt in your diet to less than 1,500 mg each day. Do not drink alcohol if: Your doctor tells you not to drink. You are pregnant, may be pregnant, or are planning to become pregnant. If you drink alcohol: Limit how much you have to: 0-1 drink a day for women. 0-2 drinks a day for men. Know how much alcohol is in your drink. In the [place] one drink equals one 12 oz bottle of beer (355 mL), one 5 oz glass of wine (148 mL), or one 1 oz glass of hard liquor (44 mL). Lifestyle  Work with your doctor to stay at a healthy weight or to lose weight. Ask your doctor what the best weight is for you. Get at least 30 minutes of exercise that causes your heart to beat faster (aerobic exercise) most days of the week. This may include walking, swimming, or biking. Get at least 30 minutes of exercise that strengthens your muscles (resistance exercise) at least 3 days a week. This may include lifting weights or doing Pilates. Do not smoke or use any products that contain nicotine or tobacco. If you need help quitting, ask your doctor. Check your blood pressure at home as told by your doctor. Keep all follow-up visits. Medicines Take over-the-counter and prescription medicines   only as told by your doctor. Follow directions carefully. Do not skip doses of blood pressure medicine. The medicine does not work as well if you skip doses. Skipping doses also puts you at risk for problems. Ask your doctor about side effects or reactions to medicines that you should watch  for. Contact a doctor if: You think you are having a reaction to the medicine you are taking. You have headaches that keep coming back. You feel dizzy. You have swelling in your ankles. You have trouble with your vision. Get help right away if: You get a very bad headache. You start to feel mixed up (confused). You feel weak or numb. You feel faint. You have very bad pain in your: Chest. Belly (abdomen). You vomit more than once. You have trouble breathing. These symptoms may be an emergency. Get help right away. Call 911. Do not wait to see if the symptoms will go away. Do not drive yourself to the hospital. Summary Hypertension is another name for high blood pressure. High blood pressure forces your heart to work harder to pump blood. For most people, a normal blood pressure is less than 120/80. Making healthy choices can help lower blood pressure. If your blood pressure does not get lower with healthy choices, you may need to take medicine. This information is not intended to replace advice given to you by your health care provider. Make sure you discuss any questions you have with your health care provider. Document Revised: 03/31/2021 Document Reviewed: 03/31/2021 Elsevier Patient Education  2024 Elsevier Inc.  

## 2023-01-02 NOTE — Progress Notes (Signed)
Patient presents today for bpc. She currently takes Amlodipine 2.5MG  at night , hydrochlorothiazide 12.5 she reports taking in the morning, Bystolic 160MG  she reports taking at night & Valsartan 160MG  she reports taking in the morning. Denies headache, chest pain, SOB. She reports walking more regularly. She reports not getting much sleep last night. She states going back to work after 7 months yesterday.  Initial bp: 136/98, bp taken again after 10 minutes:  BP Readings from Last 3 Encounters:  01/02/23 (!) 134/90  12/19/22 (!) 134/100  12/01/22 (!) 140/90  Per provider. Amlodipine 2.5MG  increased to 5MG . Patient scheduled to come back in 3 weeks for bpc. Patient aware to take 2 capsules of 2.5MG .

## 2023-01-22 ENCOUNTER — Other Ambulatory Visit: Payer: Self-pay

## 2023-01-22 ENCOUNTER — Encounter: Payer: Self-pay | Admitting: Genetic Counselor

## 2023-01-22 ENCOUNTER — Inpatient Hospital Stay: Payer: BC Managed Care – PPO

## 2023-01-22 ENCOUNTER — Inpatient Hospital Stay: Payer: BC Managed Care – PPO | Attending: Genetic Counselor | Admitting: Genetic Counselor

## 2023-01-22 DIAGNOSIS — Z803 Family history of malignant neoplasm of breast: Secondary | ICD-10-CM | POA: Insufficient documentation

## 2023-01-22 DIAGNOSIS — Z8041 Family history of malignant neoplasm of ovary: Secondary | ICD-10-CM | POA: Insufficient documentation

## 2023-01-22 DIAGNOSIS — Z800 Family history of malignant neoplasm of digestive organs: Secondary | ICD-10-CM

## 2023-01-22 DIAGNOSIS — Z8049 Family history of malignant neoplasm of other genital organs: Secondary | ICD-10-CM | POA: Insufficient documentation

## 2023-01-22 LAB — GENETIC SCREENING ORDER

## 2023-01-22 NOTE — Progress Notes (Signed)
REFERRING PROVIDER: Dorothyann Peng, MD [address] [place],  [place] [address]  PRIMARY PROVIDER:  Dorothyann Peng, MD  PRIMARY REASON FOR VISIT:  1. Family history of breast cancer   2. Family history of uterine cancer   3. Family history of colon cancer   4. Family history of ovarian cancer      HISTORY OF PRESENT ILLNESS:   Brooke [name], a 52 y.o. female, was seen for a [facility] cancer genetics consultation at the request of Dr. Allyne Gee due to a family history of cancer.  Brooke [name] presents to clinic today to discuss the possibility of a hereditary predisposition to cancer, genetic testing, and to further clarify her future cancer risks, as well as potential cancer risks for family members.   Brooke [name] is a 52 y.o. female with no personal history of cancer.    CANCER HISTORY:  Oncology History   No history exists.     RISK FACTORS:  Menarche was at age 88.  First live birth at age N/A.  OCP use for approximately 0 years.  Ovaries intact: yes.  Hysterectomy: no.  Menopausal status: perimenopausal.  HRT use: 0 years. Colonoscopy: yes; normal. Mammogram within the last year: yes. Number of breast biopsies: 0. Up to date with pelvic exams: yes. Any excessive radiation exposure in the past: no  Past Medical History:  Diagnosis Date   ADD (attention deficit disorder)    Allergy    allergic rhinitis   Anemia    nos   Anxiety    Arthritis    right ankle   Asthma    as a child   Back pain    Constipation    Depression    Elevated blood pressure reading without diagnosis of hypertension    Family history of breast cancer    Family history of colon cancer    Family history of ovarian cancer    Family history of uterine cancer    Genital herpes    Lactose intolerance    Migraine    Prediabetes    Sleep apnea    Stress    Swelling     Past Surgical History:  Procedure Laterality Date   ANKLE SURGERY     COLONOSCOPY WITH PROPOFOL N/A 10/06/2019    Procedure: COLONOSCOPY WITH PROPOFOL;  Surgeon: Kerin Salen, MD;  Location: [facility];  Service: Gastroenterology;  Laterality: N/A;   POLYPECTOMY  10/06/2019   Procedure: POLYPECTOMY;  Surgeon: Kerin Salen, MD;  Location: [facility];  Service: Gastroenterology;;    Social History   Socioeconomic History   Marital status: Single    Spouse name: Not on file   Number of children: Not on file   Years of education: Not on file   Highest education level: Not on file  Occupational History   Occupation: Asst Principal  Tobacco Use   Smoking status: Never   Smokeless tobacco: Never  Vaping Use   Vaping status: Never Used  Substance and Sexual Activity   Alcohol use: No    Comment: OCC   Drug use: No   Sexual activity: Not on file  Other Topics Concern   Not on file  Social History Narrative   Not on file   Social Determinants of Health   Financial Resource Strain: Not on file  Food Insecurity: Not on file  Transportation Needs: Not on file  Physical Activity: Not on file  Stress: Not on file  Social Connections: Not on file  FAMILY HISTORY:  We obtained a detailed, 4-generation family history.  Significant diagnoses are listed below: Family History  Problem Relation Age of Onset   Hypertension Mother    Diabetes Mother    Hyperlipidemia Mother    Hypertension Father    Sleep apnea Father    Alcoholism Father    Obesity Father    Breast cancer Sister 13   Uterine cancer Sister 39   Diabetes Maternal Aunt    Diabetes Maternal Uncle    Heart failure Paternal Aunt    Diabetes Paternal Aunt    Lymphoma Paternal Aunt    Heart failure Maternal Grandmother    Heart failure Maternal Grandfather    Ovarian cancer Cousin        mat first cousin dx in her 34s   Colon cancer Cousin        pat first cousin dx in her 77s     The patient has six sisters and two brothers.  One sister died of breast cancer at 71, a second sister had uterine cancer at 76 and had  negative genetic testing.  A third sister had negative genetic testing.  Both parents are living.  The patient's mother had eight siblings, one died of lung cancer in her 18's.  Another sister had a daughter with ovarian cancer in her 47's.  The maternal grandparents died of old age.  The patient's father had eight siblings.  One sister had lymphoma and a brother had a daughter who died of colon cancer.  The paternal grandparents are deceased from non-cancer related issues. The grandfather had a great nephew who had leukemia.  Brooke [name] is aware of previous family history of genetic testing for hereditary cancer risks. There is no reported Ashkenazi Jewish ancestry. There is no known consanguinity.  GENETIC COUNSELING ASSESSMENT: Brooke [name] is a 52 y.o. female with a family history of cancer which is somewhat suggestive of a hereditary cancer syndrome and predisposition to cancer given the combination of cancers and young ages of onset. We, therefore, discussed and recommended the following at today's visit.   DISCUSSION: We discussed that, in general, most cancer is not inherited in families, but instead is sporadic or familial. Sporadic cancers occur by chance and typically happen at older ages (>50 years) as this type of cancer is caused by genetic changes acquired during an individual's lifetime. Some families have more cancers than would be expected by chance; however, the ages or types of cancer are not consistent with a known genetic mutation or known genetic mutations have been ruled out. This type of familial cancer is thought to be due to a combination of multiple genetic, environmental, hormonal, and lifestyle factors. While this combination of factors likely increases the risk of cancer, the exact source of this risk is not currently identifiable or testable.  We discussed that 12 - 13% of cancer is hereditary.  Most cases of breast cancer are associated with BRCA mutations, while most cases  of uterine cancer are associated with MMR mutations in Lynch syndrome.  There are other genes that can be associated with hereditary breast or uterine cancer syndromes.  These include PTEN, and a few others.  We discussed that testing is beneficial for several reasons including knowing how to follow individuals after completing their treatment, identifying whether potential treatment options such as PARP inhibitors would be beneficial, and understand if other family members could be at risk for cancer and allow them to undergo genetic testing.   We  reviewed the characteristics, features and inheritance patterns of hereditary cancer syndromes. We also discussed genetic testing, including the appropriate family members to test, the process of testing, insurance coverage and turn-around-time for results. We discussed the implications of a negative, positive, carrier and/or variant of uncertain significant result. Brooke [name]  was offered a common hereditary cancer panel (47 genes) and an expanded pan-cancer panel (77 genes). Brooke [name] was informed of the benefits and limitations of each panel, including that expanded pan-cancer panels contain genes that do not have clear management guidelines at this point in time.  We also discussed that as the number of genes included on a panel increases, the chances of variants of uncertain significance increases. Brooke [name] decided to pursue genetic testing for the CancerNext-Expanded+RNA gene panel.   The CancerNext-Expanded gene panel offered by [facility] and includes sequencing and rearrangement analysis for the following 77 genes: AIP, ALK, APC*, ATM*, AXIN2, BAP1, BARD1, BMPR1A, BRCA1*, BRCA2*, BRIP1*, CDC73, CDH1*, CDK4, CDKN1B, CDKN2A, CHEK2*, CTNNA1, DICER1, FH, FLCN, KIF1B, LZTR1, MAX, MEN1, MET, MLH1*, MSH2*, MSH3, MSH6*, MUTYH*, NF1*, NF2, NTHL1, PALB2*, PHOX2B, PMS2*, POT1, PRKAR1A, PTCH1, PTEN*, RAD51C*, RAD51D*, RB1, RET, SDHA, SDHAF2, SDHB, SDHC, SDHD,  SMAD4, SMARCA4, SMARCB1, SMARCE1, STK11, SUFU, TMEM127, TP53*, TSC1, TSC2, and VHL (sequencing and deletion/duplication); EGFR, EGLN1, HOXB13, KIT, MITF, PDGFRA, POLD1, and POLE (sequencing only); EPCAM and GREM1 (deletion/duplication only). DNA and RNA analyses performed for * genes.   Based on Brooke [name]'s family history of cancer, she meets medical criteria for genetic testing. Despite that she meets criteria, she may still have an out of pocket cost. We discussed that if her out of pocket cost for testing is over $100, the laboratory will call and confirm whether she wants to proceed with testing.  If the out of pocket cost of testing is less than $100 she will be billed by the genetic testing laboratory.   We discussed that some people do not want to undergo genetic testing due to fear of genetic discrimination.  The Genetic Information Nondiscrimination Act (GINA) was signed into federal law in 2008. GINA prohibits health insurers and most employers from discriminating against individuals based on genetic information (including the results of genetic tests and family history information). According to GINA, health insurance companies cannot consider genetic information to be a preexisting condition, nor can they use it to make decisions regarding coverage or rates. GINA also makes it illegal for most employers to use genetic information in making decisions about hiring, firing, promotion, or terms of employment. It is important to note that GINA does not offer protections for life insurance, disability insurance, or long-term care insurance. GINA does not apply to those in the [employer], those who work for companies with less than 15 employees, and new life insurance or long-term disability insurance policies.  Health status due to a cancer diagnosis is not protected under GINA. More information about GINA can be found by visiting [email or web address].   Based on the patient's family history, a statistical  model (Tyrer Cusik) was used to estimate her risk of developing breast cancer. This estimates her lifetime risk of developing breast cancer to be approximately 20.3%. This estimation does not consider any genetic testing results.  The patient's lifetime breast cancer risk is a preliminary estimate based on available information using one of several models endorsed by the American Cancer Society (ACS). The ACS recommends consideration of breast MRI screening as an adjunct to mammography for patients at high risk (defined as 20% or greater  lifetime risk). Please note that a woman's breast cancer risk changes over time. It may increase or decrease based on age and any changes to the personal and/or family medical history. The risks and recommendations listed above apply to this patient at this point in time. In the future, she may or may not be eligible for the same medical management strategies and, in some cases, other medical management strategies may become available to her. If she is interested in an updated breast cancer risk assessment at a later date, she can contact us.  Brooke [name] has been determined to be at high risk for breast cancer.  Therefore, we recommend that annual screening with mammography and breast MRI be performed.  We discussed that Brooke [name] should discuss her individual situation with her referring physician and determine a breast cancer screening plan with which they are both comfortable.      PLAN: After considering the risks, benefits, and limitations, Brooke [name] provided informed consent to pursue genetic testing and the blood sample was sent to [facility] for analysis of the CancerNext-Expanded+RNAinsight. Results should be available within approximately 2-3 weeks' time, at which point they will be disclosed by telephone to Brooke [name], as will any additional recommendations warranted by these results. Brooke [name] will receive a summary of her genetic counseling  visit and a copy of her results once available. This information will also be available in Epic.   Lastly, we encouraged Brooke [name] to remain in contact with cancer genetics annually so that we can continuously update the family history and inform her of any changes in cancer genetics and testing that may be of benefit for this family.   Brooke [name] questions were answered to her satisfaction today. Our contact information was provided should additional questions or concerns arise. Thank you for the referral and allowing [place] to share in the care of your patient.   Carlon Chaloux P. Lowell Guitar, MS, [facility] Licensed, Patent attorney [email or web address] phone: [phone]  The patient was seen for a total of 35 minutes in face-to-face genetic counseling.  The patient was seen alone.  Drs. Meliton Rattan, and/or [place] were available for questions, if needed..    _______________________________________________________________________ For Office Staff:  Number of people involved in session: 1 Was an Intern/ student involved with case: no

## 2023-01-26 ENCOUNTER — Ambulatory Visit: Payer: BC Managed Care – PPO

## 2023-01-26 VITALS — BP 124/86 | HR 65 | Ht 63.0 in | Wt 293.0 lb

## 2023-01-26 DIAGNOSIS — I10 Essential (primary) hypertension: Secondary | ICD-10-CM

## 2023-01-26 NOTE — Progress Notes (Signed)
Patient presents today for a bpc. She is currently taking amlodipine 5 mg ,hydrochlorothiazide 12.5mg  and bystolic, valsartan 160mg . She is taking the amlodipine and valsartan at night and the hydrochlorothiazide and bystolic she is taking in the morning. I checked her blood pressure and it was 124/90 p 65. I waited 10 minutes and it was 124/86. Patient stated she exercises every other day.   After speaking with provider she stated patient's blood pressure is better. Her goal to exercise at least 30 minutes daily. She is to continue with her current medications and keep her next appointment with provider as scheduled.    BP Readings from Last 3 Encounters:  01/02/23 (!) 134/90  12/19/22 (!) 134/100  12/01/22 (!) 140/90

## 2023-01-29 ENCOUNTER — Encounter: Payer: Self-pay | Admitting: Genetic Counselor

## 2023-01-29 DIAGNOSIS — Z1379 Encounter for other screening for genetic and chromosomal anomalies: Secondary | ICD-10-CM | POA: Insufficient documentation

## 2023-02-01 ENCOUNTER — Ambulatory Visit: Payer: Self-pay | Admitting: Genetic Counselor

## 2023-02-01 ENCOUNTER — Telehealth: Payer: Self-pay | Admitting: Genetic Counselor

## 2023-02-01 ENCOUNTER — Inpatient Hospital Stay: Admission: RE | Admit: 2023-02-01 | Payer: BC Managed Care – PPO | Source: Ambulatory Visit

## 2023-02-01 DIAGNOSIS — Z1379 Encounter for other screening for genetic and chromosomal anomalies: Secondary | ICD-10-CM

## 2023-02-01 NOTE — Progress Notes (Signed)
HPI:  Ms. Kage was previously seen in the [facility] due to a family history of cancer and concerns regarding a hereditary predisposition to cancer. Please refer to our prior cancer genetics clinic note for more information regarding our discussion, assessment and recommendations, at the time. Ms. Bickerstaff recent genetic test results were disclosed to her, as were recommendations warranted by these results. These results and recommendations are discussed in more detail below.  CANCER HISTORY:  Oncology History   No history exists.    FAMILY HISTORY:  We obtained a detailed, 4-generation family history.  Significant diagnoses are listed below: Family History  Problem Relation Age of Onset   Hypertension Mother    Diabetes Mother    Hyperlipidemia Mother    Hypertension Father    Sleep apnea Father    Alcoholism Father    Obesity Father    Breast cancer Sister 32   Uterine cancer Sister 23   Diabetes Maternal Aunt    Diabetes Maternal Uncle    Heart failure Paternal Aunt    Diabetes Paternal Aunt    Lymphoma Paternal Aunt    Heart failure Maternal Grandmother    Heart failure Maternal Grandfather    Ovarian cancer Cousin        mat first cousin dx in her 12s   Colon cancer Cousin        pat first cousin dx in her 31s       The patient has six sisters and two brothers.  One sister died of breast cancer at 80, a second sister had uterine cancer at 10 and had negative genetic testing.  A third sister had negative genetic testing.  Both parents are living.   The patient's mother had eight siblings, one died of lung cancer in her 92's.  Another sister had a daughter with ovarian cancer in her 35's.  The maternal grandparents died of old age.   The patient's father had eight siblings.  One sister had lymphoma and a brother had a daughter who died of colon cancer.  The paternal grandparents are deceased from non-cancer related issues. The grandfather had a  great nephew who had leukemia.   Ms. Signor is aware of previous family history of genetic testing for hereditary cancer risks. There is no reported Ashkenazi Jewish ancestry. There is no known consanguinity.  GENETIC TEST RESULTS: Genetic testing reported out on January 28, 2023 through the CancerNext-Expanded+RNAinsight cancer panel found no pathogenic mutations. The CancerNext-Expanded gene panel offered by [facility] and includes sequencing and rearrangement analysis for the following 77 genes: AIP, ALK, APC*, ATM*, AXIN2, BAP1, BARD1, BMPR1A, BRCA1*, BRCA2*, BRIP1*, CDC73, CDH1*, CDK4, CDKN1B, CDKN2A, CHEK2*, CTNNA1, DICER1, FH, FLCN, KIF1B, LZTR1, MAX, MEN1, MET, MLH1*, MSH2*, MSH3, MSH6*, MUTYH*, NF1*, NF2, NTHL1, PALB2*, PHOX2B, PMS2*, POT1, PRKAR1A, PTCH1, PTEN*, RAD51C*, RAD51D*, RB1, RET, SDHA, SDHAF2, SDHB, SDHC, SDHD, SMAD4, SMARCA4, SMARCB1, SMARCE1, STK11, SUFU, TMEM127, TP53*, TSC1, TSC2, and VHL (sequencing and deletion/duplication); EGFR, EGLN1, HOXB13, KIT, MITF, PDGFRA, POLD1, and POLE (sequencing only); EPCAM and GREM1 (deletion/duplication only). DNA and RNA analyses performed for * genes. The test report has been scanned into EPIC and is located under the Molecular Pathology section of the Results Review tab.  A portion of the result report is included below for reference.     We discussed with Ms. Birkner that because current genetic testing is not perfect, it is possible there may be a gene mutation in one of these genes that current testing  cannot detect, but that chance is small.  We also discussed, that there could be another gene that has not yet been discovered, or that we have not yet tested, that is responsible for the cancer diagnoses in the family. It is also possible there is a hereditary cause for the cancer in the family that Ms. Hellenbrand did not inherit and therefore was not identified in her testing.  Therefore, it is important to remain in touch with cancer genetics in  the future so that we can continue to offer Ms. Wherley the most up to date genetic testing.   ADDITIONAL GENETIC TESTING: We discussed with Ms. Shvarts that her genetic testing was fairly extensive.  If there are genes identified to increase cancer risk that can be analyzed in the future, we would be happy to discuss and coordinate this testing at that time.    CANCER SCREENING RECOMMENDATIONS: Ms. Deaton test result is considered negative (normal).  This means that we have not identified a hereditary cause for her family history of cancer at this time. Most cancers happen by chance and this negative test suggests that her cancer may fall into this category.    Possible reasons for Ms. Shackleton's negative genetic test include:  1. There may be a gene mutation in one of these genes that current testing methods cannot detect but that chance is small.  2. There could be another gene that has not yet been discovered, or that we have not yet tested, that is responsible for the cancer diagnoses in the family.  3.  There may be no hereditary risk for cancer in the family. The cancers in Ms. Stcyr and/or her family may be sporadic/familial or due to other genetic and environmental factors. 4. It is also possible there is a hereditary cause for the cancer in the family that Ms. Twaddell did not inherit.  Therefore, it is recommended she continue to follow the cancer management and screening guidelines provided by her primary healthcare provider. An individual's cancer risk and medical management are not determined by genetic test results alone. Overall cancer risk assessment incorporates additional factors, including personal medical history, family history, and any available genetic information that may result in a personalized plan for cancer prevention and surveillance   Ms. Ferrer has been determined to be at high risk for breast cancer. her Tyrer-Cuzick risk score is 20.3%.  For women with a greater than 20%  lifetime risk of breast cancer, the [employer] (NCCN) recommends the following:   1.      Clinical encounter every 6-12 months to begin when identified as being at increased risk, but not before age 40  2.      Annual mammograms. Tomosynthesis is recommended starting 10 years earlier than the youngest breast cancer diagnosis in the family or at age 39 (whichever comes first), but not before age 60    97.      Annual breast MRI starting 10 years earlier than the youngest breast cancer diagnosis in the family or at age 23 (whichever comes first), but not before age 83.   We, therefore, discussed that it is reasonable for Ms. Begeman to be followed by a high-risk breast cancer clinic.  She has declined an referral the high risk clinic at [facility].  Therefore, in addition to a yearly mammogram and physical exam by a healthcare provider, she should discuss the usefulness of an annual breast MRI with her providers.      RECOMMENDATIONS FOR  FAMILY MEMBERS:  Individuals in this family might be at some increased risk of developing cancer, over the general population risk, simply due to the family history of cancer.  We recommended women in this family have a yearly mammogram beginning at age 69, or 28 years younger than the earliest onset of cancer, an annual clinical breast exam, and perform monthly breast self-exams. Women in this family should also have a gynecological exam as recommended by their primary provider. All family members should be referred for colonoscopy starting at age 32.  FOLLOW-UP: Lastly, we discussed with Ms. Garlow that cancer genetics is a rapidly advancing field and it is possible that new genetic tests will be appropriate for her and/or her family members in the future. We encouraged her to remain in contact with cancer genetics on an annual basis so we can update her personal and family histories and let her know of advances in cancer genetics that may benefit this  family.   Our contact number was provided. Ms. Gazzola questions were answered to her satisfaction, and she knows she is welcome to call us at anytime with additional questions or concerns.   Maylon Cos, MS, [facility] Licensed, Certified Genetic Counselor [email or web address]

## 2023-02-01 NOTE — Telephone Encounter (Signed)
Revealed negative genetic testing.  Discussed that we do not know why there is cancer in the family. It could be due to a different gene that we are not testing, or maybe our current technology may not be able to pick something up.  It will be important for her to keep in contact with genetics to keep up with whether additional testing may be needed.  

## 2023-02-08 ENCOUNTER — Ambulatory Visit
Admission: RE | Admit: 2023-02-08 | Discharge: 2023-02-08 | Disposition: A | Discharge status: Home / Self Care | Payer: BC Managed Care – PPO | Source: Ambulatory Visit | Attending: Internal Medicine | Admitting: Internal Medicine

## 2023-02-08 DIAGNOSIS — Z1231 Encounter for screening mammogram for malignant neoplasm of breast: Secondary | ICD-10-CM

## 2023-02-09 ENCOUNTER — Other Ambulatory Visit: Payer: Self-pay | Admitting: Internal Medicine

## 2023-02-09 DIAGNOSIS — I10 Essential (primary) hypertension: Secondary | ICD-10-CM

## 2023-02-16 ENCOUNTER — Other Ambulatory Visit: Payer: Self-pay | Admitting: Internal Medicine

## 2023-02-16 ENCOUNTER — Other Ambulatory Visit: Payer: Self-pay

## 2023-02-16 ENCOUNTER — Encounter: Payer: Self-pay | Admitting: Internal Medicine

## 2023-02-16 DIAGNOSIS — I10 Essential (primary) hypertension: Secondary | ICD-10-CM

## 2023-02-16 MED ORDER — AMLODIPINE BESYLATE 5 MG PO TABS
5.0000 mg | ORAL_TABLET | Freq: Every day | ORAL | 1 refills | Status: DC
Start: 2023-02-16 — End: 2023-02-16

## 2023-02-16 MED ORDER — NEBIVOLOL HCL 10 MG PO TABS: 10.0000 mg | ORAL_TABLET | Freq: Every day | ORAL | 0 refills | Status: DC

## 2023-02-23 ENCOUNTER — Encounter: Payer: Self-pay | Admitting: Internal Medicine

## 2023-02-23 ENCOUNTER — Other Ambulatory Visit: Payer: Self-pay

## 2023-02-23 ENCOUNTER — Emergency Department (HOSPITAL_COMMUNITY)
Admission: EM | Admit: 2023-02-23 | Discharge: 2023-02-23 | Disposition: A | Discharge status: Home / Self Care | Payer: BC Managed Care – PPO | Attending: Emergency Medicine | Admitting: Emergency Medicine

## 2023-02-23 ENCOUNTER — Emergency Department (HOSPITAL_COMMUNITY): Payer: BC Managed Care – PPO

## 2023-02-23 DIAGNOSIS — Z79899 Other long term (current) drug therapy: Secondary | ICD-10-CM | POA: Insufficient documentation

## 2023-02-23 DIAGNOSIS — I10 Essential (primary) hypertension: Secondary | ICD-10-CM | POA: Insufficient documentation

## 2023-02-23 DIAGNOSIS — D649 Anemia, unspecified: Secondary | ICD-10-CM | POA: Diagnosis not present

## 2023-02-23 DIAGNOSIS — R030 Elevated blood-pressure reading, without diagnosis of hypertension: Secondary | ICD-10-CM | POA: Diagnosis present

## 2023-02-23 LAB — CBC
HCT: 37.5 % (ref 36.0–46.0)
Hemoglobin: 11.7 g/dL — ABNORMAL LOW (ref 12.0–15.0)
MCH: 25 pg — ABNORMAL LOW (ref 26.0–34.0)
MCHC: 31.2 g/dL (ref 30.0–36.0)
MCV: 80.1 fL (ref 80.0–100.0)
Platelets: 380 10*3/uL (ref 150–400)
RBC: 4.68 MIL/uL (ref 3.87–5.11)
RDW: 15.5 % (ref 11.5–15.5)
WBC: 8.9 10*3/uL (ref 4.0–10.5)
nRBC: 0 % (ref 0.0–0.2)

## 2023-02-23 LAB — BASIC METABOLIC PANEL
Anion gap: 9 (ref 5–15)
BUN: 8 mg/dL (ref 6–20)
CO2: 31 mmol/L (ref 22–32)
Calcium: 9.6 mg/dL (ref 8.9–10.3)
Chloride: 95 mmol/L — ABNORMAL LOW (ref 98–111)
Creatinine, Ser: 0.81 mg/dL (ref 0.44–1.00)
GFR, Estimated: >60 mL/min (ref >60)
Glucose, Bld: 101 mg/dL — ABNORMAL HIGH (ref 70–99)
Potassium: 3.8 mmol/L (ref 3.5–5.1)
Sodium: 135 mmol/L (ref 135–145)

## 2023-02-23 LAB — TROPONIN I (HIGH SENSITIVITY)
Troponin I (High Sensitivity): 4 ng/L (ref <18)
Troponin I (High Sensitivity): 4 ng/L (ref <18)

## 2023-02-23 LAB — HCG, SERUM, QUALITATIVE: Preg, Serum: NEGATIVE

## 2023-02-23 NOTE — ED Triage Notes (Signed)
Pt with hx of hypertension and medicated for same here for eval of bp reading of 160/100 at work this morning. Patient jittery, exceptionally tired, having brain fog and headache ongoing. After leaving work today her L arm began to hurt and was advised to come to the ED by her PCP.

## 2023-02-23 NOTE — Discharge Instructions (Addendum)
You were seen today for high blood pressure as well as headache and arm pain.  Your workup did not show any signs of stroke, heart damage, kidney damage.  Please continue to take your antihypertensive medications.  I do recommend that you follow-up with your primary care doctor early next week.  Please return to the emergency department if you are having new or worsening chest pain, shortness of breath, headache, vision change.

## 2023-02-23 NOTE — ED Provider Notes (Signed)
[place] EMERGENCY DEPARTMENT AT [facility] Provider Note   CSN: [id number] Arrival date & time: 02/23/23  1232     History  Chief Complaint  Patient presents with   Hypertension    Brooke [name] is a 52 y.o. female.   Hypertension  Patient reports that she was at work yesterday when she started feeling headache and mild blurry vision.  This resolved when she went home however today at work she began having similar symptoms.  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  At home she started feeling left arm pain which lasted approximately 10 minutes while laying down.  She called her primary care doctor who recommended that she come to the emergency department to be evaluated.  She reports that at this time she is having no symptoms.  She does report compliance with her current antihypertensive regimen.  She does feel significant stress related to her current job.     Home Medications Prior to Admission medications   Medication Sig Start Date End Date Taking? Authorizing Provider  albuterol (VENTOLIN HFA) 108 (90 Base) MCG/ACT inhaler Inhale 2 puffs into the lungs every 6 (six) hours as needed for wheezing or shortness of breath. 07/10/22   Dorothyann Peng, MD  amLODipine (NORVASC) 5 MG tablet TAKE 1 TABLET(5 MG) BY MOUTH DAILY 02/16/23   Dorothyann Peng, MD  amphetamine-dextroamphetamine (ADDERALL) 30 MG tablet Take 30 mg by mouth daily. Once daily.    [provider]  hydrochlorothiazide (MICROZIDE) 12.5 MG capsule Take 1 capsule (12.5 mg total) by mouth daily. 09/14/22   Dorothyann Peng, MD  Multiple Vitamin (MULTIVITAMIN) tablet Take 1 tablet by mouth daily.    [provider]  nebivolol (BYSTOLIC) 10 MG tablet Take 1 tablet (10 mg total) by mouth daily. 02/16/23   Dorothyann Peng, MD  sertraline (ZOLOFT) 100 MG tablet TAKE 1 TABLET(100 MG) BY MOUTH DAILY Patient  taking differently: TAKE 1 TABLET(150 MG) BY MOUTH DAILY 04/12/22   Dorothyann Peng, MD  valACYclovir (VALTREX) 500 MG tablet Take 1 tablet (500 mg total) by mouth as needed (genital herpes). 07/14/20   Charlesetta Ivory, NP  valsartan (DIOVAN) 160 MG tablet TAKE 1 TABLET(160 MG) BY MOUTH DAILY 04/10/22   Dorothyann Peng, MD  Vitamin D, Cholecalciferol, 25 MCG (1000 UT) TABS Take 5,000 Units by mouth daily.     [provider]  zaleplon (SONATA) 10 MG capsule Take 10 mg by mouth daily. 07/31/22   [provider]      Allergies    Patient has no known allergies.    Review of Systems   Review of Systems  Physical Exam Updated Vital Signs BP 129/83 (BP Location: Left Arm)   Pulse 65   Temp 98.1 F (36.7 C) (Oral)   Resp 17   SpO2 100%  Physical Exam Vitals and nursing note reviewed.  Constitutional:      General: She is not in acute distress.    Appearance: She is well-developed.  HENT:     Head: Normocephalic and atraumatic.  Eyes:     General: No visual field deficit.    Conjunctiva/sclera: Conjunctivae normal.  Cardiovascular:     Rate and Rhythm: Normal rate and regular rhythm.     Heart sounds: No murmur heard. Pulmonary:     Effort: Pulmonary effort is normal. No respiratory distress.     Breath sounds: Normal  breath sounds.  Abdominal:     Palpations: Abdomen is soft.     Tenderness: There is no abdominal tenderness.  Musculoskeletal:        General: No swelling.     Cervical back: Neck supple.  Skin:    General: Skin is warm and dry.     Capillary Refill: Capillary refill takes less than 2 seconds.  Neurological:     General: No focal deficit present.     Mental Status: She is alert and oriented to person, place, and time.     GCS: GCS eye subscore is 4. GCS verbal subscore is 5. GCS motor subscore is 6.     Cranial Nerves: Cranial nerves 2-12 are intact. No cranial nerve deficit, dysarthria or facial asymmetry.     Sensory: Sensation is intact.      Motor: Motor function is intact. No weakness.     Coordination: Coordination is intact.  Psychiatric:        Mood and Affect: Mood normal.     ED Results / Procedures / Treatments   Labs (all labs ordered are listed, but only abnormal results are displayed) Labs Reviewed  BASIC METABOLIC PANEL - Abnormal; Notable for the following components:      Result Value   Chloride 95 (*)    Glucose, Bld 101 (*)    All other components within normal limits  CBC - Abnormal; Notable for the following components:   Hemoglobin 11.7 (*)    MCH 25.0 (*)    All other components within normal limits  HCG, SERUM, QUALITATIVE  TROPONIN I (HIGH SENSITIVITY)  TROPONIN I (HIGH SENSITIVITY)    EKG EKG Interpretation Date/Time:  Friday February 23 2023 12:42:39 EDT Ventricular Rate:  72 PR Interval:  172 QRS Duration:  78 QT Interval:  392 QTC Calculation: 429 R Axis:   -28  Text Interpretation: Normal sinus rhythm Minimal voltage criteria for LVH, may be normal variant ( R in aVL ) Nonspecific T wave abnormality Abnormal ECG No previous ECGs available Confirmed by Richardean Canal [phone]) on 02/23/2023 4:05:22 PM  Radiology DG Chest 2 View  Result Date: 02/23/2023 CLINICAL DATA:  Chest pain.  Lethargic. EXAM: CHEST - 2 VIEW COMPARISON:  03/10/2019. FINDINGS: Bilateral lung fields are clear. Bilateral costophrenic angles are clear. Normal cardio-mediastinal silhouette. No acute osseous abnormalities. The soft tissues are within normal limits. IMPRESSION: No active cardiopulmonary disease. Electronically Signed   By: Jules Schick M.D.   On: 02/23/2023 14:05    Procedures Procedures    Medications Ordered in ED Medications - No data to display  ED Course/ Medical Decision Making/ A&P                                 Medical Decision Making Amount and/or Complexity of Data Reviewed Labs: ordered. Radiology: ordered.   Patient is a 52 year old female with a history of hypertension  presenting for left arm pain.  On my initial evaluation, she is afebrile, hemodynamically stable, in no acute distress.  Reports that she had an elevated blood pressure checked by a school nurse today associated with headache and arm pain and therefore decided to come to the emergency department.  Her exam is without neurodeficit.  On my evaluation, patient's blood pressure has improved to 140s over 80s.  Given coincidence of her symptoms with elevated blood pressure, will obtain labs and chest x-ray to evaluate for hypertensive  emergency.  Specifically will obtain EKG and troponin to evaluate for cardiac complication, BMP to evaluate for kidney injury.  Based on her reassuring neuroexam, I do not suspect intracranial process.  Results reviewed.  CBC without leukocytosis, mild anemia present with hemoglobin 11.7.  BMP with overall normal electrolytes, no AKI or anion gap.  Initial and repeat troponin are 4.  EKG reviewed and without ST elevations or depressions to suggest ischemia.  Chest x-ray without acute cardiopulmonary abnormality.  All results discussed with patient.  Given her current absence of symptoms and improvement in blood pressure, now 129/83, I do not feel that she requires further emergent evaluation or intervention.  I did recommend that she continue with her home antihypertensives.  She has outpatient follow-up with her primary care doctor scheduled for next week and has recently had changes to her antihypertensive regimen so do not feel that she will benefit from me changing these today.  Discussed return precautions including new or worsening headache, chest pain, shortness of breath.  Patient reports understanding and is in agreement with discharge at this time.  Discharged that further acute eval under my care in the emergency department.        Final Clinical Impression(s) / ED Diagnoses Final diagnoses:  Hypertension, unspecified type    Rx / DC Orders ED Discharge Orders      None         Claretha Cooper, DO 02/23/23 1911    Charlynne Pander, MD 02/23/23 2300

## 2023-02-23 NOTE — ED Notes (Signed)
Patient verbalizes understanding of discharge instructions. Opportunity for questioning and answers were provided. Armband removed by staff, pt discharged from ED. Pt ambulatory to ED waiting room with steady gait.  

## 2023-02-28 NOTE — Patient Instructions (Incomplete)
Hypertension, Adult High blood pressure (hypertension) is when the force of blood pumping through the arteries is too strong. The arteries are the blood vessels that carry blood from the heart throughout the body. Hypertension forces the heart to work harder to pump blood and may cause arteries to become narrow or stiff. Untreated or uncontrolled hypertension can lead to a heart attack, heart failure, a stroke, kidney disease, and other problems. A blood pressure reading consists of a higher number over a lower number. Ideally, your blood pressure should be below 120/80. The first ("top") number is called the systolic pressure. It is a measure of the pressure in your arteries as your heart beats. The second ("bottom") number is called the diastolic pressure. It is a measure of the pressure in your arteries as the heart relaxes. What are the causes? The exact cause of this condition is not known. There are some conditions that result in high blood pressure. What increases the risk? Certain factors may make you more likely to develop high blood pressure. Some of these risk factors are under your control, including: Smoking. Not getting enough exercise or physical activity. Being overweight. Having too much fat, sugar, calories, or salt (sodium) in your diet. Drinking too much alcohol. Other risk factors include: Having a personal history of heart disease, diabetes, high cholesterol, or kidney disease. Stress. Having a family history of high blood pressure and high cholesterol. Having obstructive sleep apnea. Age. The risk increases with age. What are the signs or symptoms? High blood pressure may not cause symptoms. Very high blood pressure (hypertensive crisis) may cause: Headache. Fast or irregular heartbeats (palpitations). Shortness of breath. Nosebleed. Nausea and vomiting. Vision changes. Severe chest pain, dizziness, and seizures. How is this diagnosed? This condition is diagnosed by  measuring your blood pressure while you are seated, with your arm resting on a flat surface, your legs uncrossed, and your feet flat on the floor. The cuff of the blood pressure monitor will be placed directly against the skin of your upper arm at the level of your heart. Blood pressure should be measured at least twice using the same arm. Certain conditions can cause a difference in blood pressure between your right and left arms. If you have a high blood pressure reading during one visit or you have normal blood pressure with other risk factors, you may be asked to: Return on a different day to have your blood pressure checked again. Monitor your blood pressure at home for 1 week or longer. If you are diagnosed with hypertension, you may have other blood or imaging tests to help your health care provider understand your overall risk for other conditions. How is this treated? This condition is treated by making healthy lifestyle changes, such as eating healthy foods, exercising more, and reducing your alcohol intake. You may be referred for counseling on a healthy diet and physical activity. Your health care provider may prescribe medicine if lifestyle changes are not enough to get your blood pressure under control and if: Your systolic blood pressure is above 130. Your diastolic blood pressure is above 80. Your personal target blood pressure may vary depending on your medical conditions, your age, and other factors. Follow these instructions at home: Eating and drinking  Eat a diet that is high in fiber and potassium, and low in sodium, added sugar, and fat. An example of this eating plan is called the DASH diet. DASH stands for Dietary Approaches to Stop Hypertension. To eat this way: Eat   plenty of fresh fruits and vegetables. Try to fill one half of your plate at each meal with fruits and vegetables. Eat whole grains, such as whole-wheat pasta, brown rice, or whole-grain bread. Fill about one  fourth of your plate with whole grains. Eat or drink low-fat dairy products, such as skim milk or low-fat yogurt. Avoid fatty cuts of meat, processed or cured meats, and poultry with skin. Fill about one fourth of your plate with lean proteins, such as fish, chicken without skin, beans, eggs, or tofu. Avoid pre-made and processed foods. These tend to be higher in sodium, added sugar, and fat. Reduce your daily sodium intake. Many people with hypertension should eat less than 1,500 mg of sodium a day. Do not drink alcohol if: Your health care provider tells you not to drink. You are pregnant, may be pregnant, or are planning to become pregnant. If you drink alcohol: Limit how much you have to: 0-1 drink a day for women. 0-2 drinks a day for men. Know how much alcohol is in your drink. In the [place] one drink equals one 12 oz bottle of beer (355 mL), one 5 oz glass of wine (148 mL), or one 1 oz glass of hard liquor (44 mL). Lifestyle  Work with your health care provider to maintain a healthy body weight or to lose weight. Ask what an ideal weight is for you. Get at least 30 minutes of exercise that causes your heart to beat faster (aerobic exercise) most days of the week. Activities may include walking, swimming, or biking. Include exercise to strengthen your muscles (resistance exercise), such as Pilates or lifting weights, as part of your weekly exercise routine. Try to do these types of exercises for 30 minutes at least 3 days a week. Do not use any products that contain nicotine or tobacco. These products include cigarettes, chewing tobacco, and vaping devices, such as e-cigarettes. If you need help quitting, ask your health care provider. Monitor your blood pressure at home as told by your health care provider. Keep all follow-up visits. This is important. Medicines Take over-the-counter and prescription medicines only as told by your health care provider. Follow directions carefully. Blood  pressure medicines must be taken as prescribed. Do not skip doses of blood pressure medicine. Doing this puts you at risk for problems and can make the medicine less effective. Ask your health care provider about side effects or reactions to medicines that you should watch for. Contact a health care provider if you: Think you are having a reaction to a medicine you are taking. Have headaches that keep coming back (recurring). Feel dizzy. Have swelling in your ankles. Have trouble with your vision. Get help right away if you: Develop a severe headache or confusion. Have unusual weakness or numbness. Feel faint. Have severe pain in your chest or abdomen. Vomit repeatedly. Have trouble breathing. These symptoms may be an emergency. Get help right away. Call 911. Do not wait to see if the symptoms will go away. Do not drive yourself to the hospital. Summary Hypertension is when the force of blood pumping through your arteries is too strong. If this condition is not controlled, it may put you at risk for serious complications. Your personal target blood pressure may vary depending on your medical conditions, your age, and other factors. For most people, a normal blood pressure is less than 120/80. Hypertension is treated with lifestyle changes, medicines, or a combination of both. Lifestyle changes include losing weight, eating a healthy,   low-sodium diet, exercising more, and limiting alcohol. This information is not intended to replace advice given to you by your health care provider. Make sure you discuss any questions you have with your health care provider. Document Revised: 04/19/2021 Document Reviewed: 04/19/2021 Elsevier Patient Education  2024 Elsevier Inc.  

## 2023-03-01 ENCOUNTER — Encounter: Payer: Self-pay | Admitting: Internal Medicine

## 2023-03-01 ENCOUNTER — Ambulatory Visit: Payer: BC Managed Care – PPO | Admitting: Internal Medicine

## 2023-03-01 VITALS — BP 110/84 | Temp 98.0°F | Ht 63.0 in | Wt 293.4 lb

## 2023-03-01 DIAGNOSIS — Z6841 Body Mass Index (BMI) 40.0 and over, adult: Secondary | ICD-10-CM

## 2023-03-01 DIAGNOSIS — R7309 Other abnormal glucose: Secondary | ICD-10-CM

## 2023-03-01 DIAGNOSIS — Z23 Encounter for immunization: Secondary | ICD-10-CM

## 2023-03-01 DIAGNOSIS — D649 Anemia, unspecified: Secondary | ICD-10-CM

## 2023-03-01 DIAGNOSIS — I10 Essential (primary) hypertension: Secondary | ICD-10-CM

## 2023-03-01 DIAGNOSIS — F331 Major depressive disorder, recurrent, moderate: Secondary | ICD-10-CM | POA: Diagnosis not present

## 2023-03-01 DIAGNOSIS — E66813 Obesity, class 3: Secondary | ICD-10-CM

## 2023-03-01 MED ORDER — VALSARTAN-HYDROCHLOROTHIAZIDE 160-12.5 MG PO TABS: 1.0000 | ORAL_TABLET | Freq: Every day | ORAL | 2 refills | Status: DC

## 2023-03-01 NOTE — Progress Notes (Signed)
I,Brooke [name], CMA,acting as a Neurosurgeon for Brooke Aliment, MD.,have documented all relevant documentation on the behalf of Brooke Aliment, MD,as directed by  Brooke Aliment, MD while in the presence of Brooke Aliment, MD.  Subjective:  Patient ID: Brooke [name] , female    DOB: 1971/04/02 , 52 y.o.   MRN: [id number]  Chief Complaint  Patient presents with   Hypertension    HPI  Patient presents today for a BP check.  She reports compliance with medications. Denies headache, chest pain, and SOB.  She reports going to the ER for a headache 9/30. She admits she was feeling very lethargic, light headed, pressure behind her eyes, throbbing pain in her left arm, and confusion. The school nurse took her BP and the reading was 160/100. She states she was feeling exhausted, but can't state why she has been feeling this way.    ER eval was unrevealing. CBC without leukocytosis, mild anemia present with hemoglobin 11.7.  BMP with overall normal electrolytes, no AKI or anion gap.  Initial and repeat troponin are 4.  EKG reviewed and without ST elevations or depressions to suggest ischemia.  Chest x-ray without acute cardiopulmonary abnormality.  Since d/c from ER, she has not had any recurrence of her symptoms.      Hypertension This is a chronic problem. The current episode started more than 1 year ago. The problem has been gradually improving since onset. The problem is controlled. Pertinent negatives include no blurred vision, chest pain, palpitations or shortness of breath. Risk factors for coronary artery disease include obesity. Past treatments include diuretics. The current treatment provides mild improvement. Compliance problems include exercise.      Past Medical History:  Diagnosis Date   ADD (attention deficit disorder)    Allergy    allergic rhinitis   Anemia    nos   Anxiety    Arthritis    right ankle   Asthma    as a child   Back pain    Constipation     Depression    Elevated blood pressure reading without diagnosis of hypertension    Family history of breast cancer    Family history of colon cancer    Family history of ovarian cancer    Family history of uterine cancer    Genital herpes    Lactose intolerance    Migraine    Prediabetes    Sleep apnea    Stress    Swelling      Family History  Problem Relation Age of Onset   Hypertension Mother    Diabetes Mother    Hyperlipidemia Mother    Hypertension Father    Sleep apnea Father    Alcoholism Father    Obesity Father    Breast cancer Sister 82   Uterine cancer Sister 35   Diabetes Maternal Aunt    Diabetes Maternal Uncle    Heart failure Paternal Aunt    Diabetes Paternal Aunt    Lymphoma Paternal Aunt    Heart failure Maternal Grandmother    Heart failure Maternal Grandfather    Ovarian cancer Cousin        mat first cousin dx in her 59s   Colon cancer Cousin        pat first cousin dx in her 1s     Current Outpatient Medications:    amLODipine (NORVASC) 5 MG tablet, TAKE 1 TABLET(5 MG) BY MOUTH DAILY, Disp: 90 tablet, Rfl:  2   amphetamine-dextroamphetamine (ADDERALL) 30 MG tablet, Take 30 mg by mouth daily. Once daily., Disp: , Rfl:    Multiple Vitamin (MULTIVITAMIN) tablet, Take 1 tablet by mouth daily., Disp: , Rfl:    nebivolol (BYSTOLIC) 10 MG tablet, Take 1 tablet (10 mg total) by mouth daily., Disp: 90 tablet, Rfl: 0   sertraline (ZOLOFT) 100 MG tablet, TAKE 1 TABLET(100 MG) BY MOUTH DAILY (Patient taking differently: TAKE 1 TABLET(150 MG) BY MOUTH DAILY), Disp: 90 tablet, Rfl: 1   valACYclovir (VALTREX) 500 MG tablet, Take 1 tablet (500 mg total) by mouth as needed (genital herpes)., Disp: 90 tablet, Rfl: 0   valsartan-hydrochlorothiazide (DIOVAN HCT) 160-12.5 MG tablet, Take 1 tablet by mouth daily., Disp: 90 tablet, Rfl: 2   Vitamin D, Cholecalciferol, 25 MCG (1000 UT) TABS, Take 5,000 Units by mouth daily. , Disp: , Rfl:    zaleplon (SONATA) 10 MG  capsule, Take 10 mg by mouth daily., Disp: , Rfl:    albuterol (VENTOLIN HFA) 108 (90 Base) MCG/ACT inhaler, Inhale 2 puffs into the lungs every 6 (six) hours as needed for wheezing or shortness of breath. (Patient not taking: Reported on 03/01/2023), Disp: 8 g, Rfl: 2   No Known Allergies   Review of Systems  Constitutional: Negative.   Eyes:  Negative for blurred vision.  Respiratory: Negative.  Negative for shortness of breath.   Cardiovascular: Negative.  Negative for chest pain and palpitations.  Gastrointestinal: Negative.   Neurological: Negative.   Psychiatric/Behavioral: Negative.       Today's Vitals   03/01/23 1617  BP: 110/84  Temp: 98 F (36.7 C)  SpO2: 98%  Weight: 293 lb 6.4 oz (133.1 kg)  Height: 5\' 3"  (1.6 m)   Body mass index is 51.97 kg/m.  Wt Readings from Last 3 Encounters:  03/01/23 293 lb 6.4 oz (133.1 kg)  01/26/23 293 lb (132.9 kg)  01/02/23 293 lb (132.9 kg)     Objective:  Physical Exam Vitals and nursing note reviewed.  Constitutional:      Appearance: Normal appearance. She is obese.  HENT:     Head: Normocephalic and atraumatic.  Eyes:     Extraocular Movements: Extraocular movements intact.  Cardiovascular:     Rate and Rhythm: Normal rate and regular rhythm.     Heart sounds: Normal heart sounds.  Pulmonary:     Effort: Pulmonary effort is normal.     Breath sounds: Normal breath sounds.  Musculoskeletal:     Cervical back: Normal range of motion.  Skin:    General: Skin is warm.  Neurological:     General: No focal deficit present.     Mental Status: She is alert.  Psychiatric:        Mood and Affect: Mood normal.        Behavior: Behavior normal.         Assessment And Plan:  Essential hypertension Assessment & Plan: Chronic, ER records reviewed in full detail. BP is improved today. WE discussed the importance of stress management.  She will continue with amlodipine 5mg  daily and nebivolol 10mg  daily. She is reminded to  follow a low sodium diet. She will f/u in three to four months for re-evaluation.   Moderate episode of recurrent major depressive disorder [facility]) Assessment & Plan: Chronic, currently followed by Psych. She will continue with sertraline 150mg  daily.    Anemia, unspecified type -     Iron, TIBC and Ferritin Panel  Other abnormal glucose Assessment &  Plan: Previous labs reviewed, her A1c has been elevated in the past. I will check an A1c today. Reminded to avoid refined sugars including sugary drinks/foods and processed meats including bacon, sausages and deli meats.    Orders: -     Hemoglobin A1c  Class 3 severe obesity due to excess calories with serious comorbidity and body mass index (BMI) of 50.0 to 59.9 in adult [facility]) Assessment & Plan: Chronic, she is encouraged to aim to lose ten percent of her body weight to decrease cardiac risk. Advised to aim for at least 150 minutes of exercise/week, while also incorporating strength training at least twice weekly.    Immunization due -     Flu vaccine trivalent PF, 6mos and older(Flulaval,Afluria,Fluarix,Fluzone)  Other orders -     Valsartan-hydroCHLOROthiazide; Take 1 tablet by mouth daily.  Dispense: 90 tablet; Refill: 2  She is encouraged to strive for BMI less than 30 to decrease cardiac risk. Advised to aim for at least 150 minutes of exercise per week.    Return for 4 month BP f/u.  Patient was given opportunity to ask questions. Patient verbalized understanding of the plan and was able to repeat key elements of the plan. All questions were answered to their satisfaction.    I, Brooke Aliment, MD, have reviewed all documentation for this visit. The documentation on 03/01/23 for the exam, diagnosis, procedures, and orders are all accurate and complete.   IF YOU HAVE BEEN REFERRED TO A SPECIALIST, IT MAY TAKE 1-2 WEEKS TO SCHEDULE/PROCESS THE REFERRAL. IF YOU HAVE NOT HEARD FROM US/SPECIALIST IN TWO WEEKS, PLEASE GIVE [place] A  CALL AT [phone].   THE PATIENT IS ENCOURAGED TO PRACTICE SOCIAL DISTANCING DUE TO THE COVID-19 PANDEMIC.

## 2023-03-02 LAB — HEMOGLOBIN A1C
Est. average glucose Bld gHb Est-mCnc: 131 mg/dL
Hgb A1c MFr Bld: 6.2 % — ABNORMAL HIGH (ref 4.8–5.6)

## 2023-03-02 LAB — IRON,TIBC AND FERRITIN PANEL
Ferritin: 197 ng/mL — ABNORMAL HIGH (ref 15–150)
Iron Saturation: 21 % (ref 15–55)
Iron: 64 ug/dL (ref 27–159)
Total Iron Binding Capacity: 307 ug/dL (ref 250–450)
UIBC: 243 ug/dL (ref 131–425)

## 2023-03-15 NOTE — Assessment & Plan Note (Signed)
Chronic, currently followed by Psych. She will continue with sertraline 150mg  daily.

## 2023-03-15 NOTE — Assessment & Plan Note (Signed)
Previous labs reviewed, her A1c has been elevated in the past. I will check an A1c today. Reminded to avoid refined sugars including sugary drinks/foods and processed meats including bacon, sausages and deli meats.

## 2023-03-15 NOTE — Assessment & Plan Note (Signed)
Chronic, she is encouraged to aim to lose ten percent of her body weight to decrease cardiac risk. Advised to aim for at least 150 minutes of exercise/week, while also incorporating strength training at least twice weekly.

## 2023-03-15 NOTE — Assessment & Plan Note (Signed)
Chronic, ER records reviewed in full detail. BP is improved today. WE discussed the importance of stress management.  She will continue with amlodipine 5mg  daily and nebivolol 10mg  daily. She is reminded to follow a low sodium diet. She will f/u in three to four months for re-evaluation.

## 2023-04-30 ENCOUNTER — Other Ambulatory Visit: Payer: Self-pay

## 2023-04-30 ENCOUNTER — Other Ambulatory Visit (HOSPITAL_COMMUNITY): Payer: Self-pay

## 2023-04-30 MED ORDER — AMPHETAMINE-DEXTROAMPHET ER 30 MG PO CP24
30.0000 mg | ORAL_CAPSULE | Freq: Every day | ORAL | 0 refills | Status: DC
Start: 2023-04-30 — End: 2023-07-11
  Filled 2023-04-30: qty 30, 30d supply, fill #0

## 2023-05-21 ENCOUNTER — Other Ambulatory Visit (HOSPITAL_COMMUNITY): Payer: Self-pay

## 2023-05-21 MED ORDER — AMPHETAMINE-DEXTROAMPHET ER 30 MG PO CP24: 30.0000 mg | ORAL_CAPSULE | Freq: Every day | ORAL | 0 refills | Status: DC

## 2023-05-21 MED ORDER — ZALEPLON 10 MG PO CAPS
10.0000 mg | ORAL_CAPSULE | Freq: Every day | ORAL | 3 refills | Status: DC
Start: 2023-05-19 — End: 2023-07-11
  Filled 2023-05-25: qty 30, 30d supply, fill #0

## 2023-05-23 ENCOUNTER — Other Ambulatory Visit: Payer: Self-pay | Admitting: Internal Medicine

## 2023-05-23 DIAGNOSIS — I10 Essential (primary) hypertension: Secondary | ICD-10-CM

## 2023-05-23 MED ORDER — NEBIVOLOL HCL 10 MG PO TABS: 10.0000 mg | ORAL_TABLET | Freq: Every day | ORAL | 0 refills | Status: DC

## 2023-05-25 ENCOUNTER — Other Ambulatory Visit: Payer: Self-pay

## 2023-05-25 ENCOUNTER — Other Ambulatory Visit (HOSPITAL_COMMUNITY): Payer: Self-pay

## 2023-05-27 ENCOUNTER — Encounter: Payer: Self-pay | Admitting: Internal Medicine

## 2023-06-01 ENCOUNTER — Other Ambulatory Visit (HOSPITAL_COMMUNITY): Payer: Self-pay

## 2023-06-29 ENCOUNTER — Other Ambulatory Visit (HOSPITAL_COMMUNITY): Payer: Self-pay

## 2023-06-29 MED ORDER — AMPHETAMINE-DEXTROAMPHET ER 30 MG PO CP24
30.0000 mg | ORAL_CAPSULE | Freq: Every day | ORAL | 0 refills | Status: DC
Start: 2023-06-29 — End: 2023-07-11
  Filled 2023-06-29: qty 30, 30d supply, fill #0

## 2023-06-29 MED ORDER — ZALEPLON 10 MG PO CAPS
10.0000 mg | ORAL_CAPSULE | Freq: Every day | ORAL | 3 refills | Status: DC
Start: 2023-06-29 — End: 2023-11-13
  Filled 2023-06-29: qty 30, 30d supply, fill #0
  Filled 2023-08-08: qty 30, 30d supply, fill #1

## 2023-07-09 ENCOUNTER — Other Ambulatory Visit: Payer: Self-pay | Admitting: Internal Medicine

## 2023-07-09 ENCOUNTER — Encounter: Payer: Self-pay | Admitting: Internal Medicine

## 2023-07-09 DIAGNOSIS — Z0000 Encounter for general adult medical examination without abnormal findings: Secondary | ICD-10-CM

## 2023-07-10 ENCOUNTER — Other Ambulatory Visit: Payer: 59

## 2023-07-10 DIAGNOSIS — Z0000 Encounter for general adult medical examination without abnormal findings: Secondary | ICD-10-CM

## 2023-07-11 ENCOUNTER — Encounter: Payer: Self-pay | Admitting: Internal Medicine

## 2023-07-11 ENCOUNTER — Ambulatory Visit (INDEPENDENT_AMBULATORY_CARE_PROVIDER_SITE_OTHER): Payer: 59 | Admitting: Internal Medicine

## 2023-07-11 VITALS — BP 134/82 | Temp 98.7°F | Ht 63.0 in | Wt 302.8 lb

## 2023-07-11 DIAGNOSIS — M25552 Pain in left hip: Secondary | ICD-10-CM | POA: Diagnosis not present

## 2023-07-11 DIAGNOSIS — Z23 Encounter for immunization: Secondary | ICD-10-CM

## 2023-07-11 DIAGNOSIS — M25562 Pain in left knee: Secondary | ICD-10-CM

## 2023-07-11 DIAGNOSIS — G8929 Other chronic pain: Secondary | ICD-10-CM

## 2023-07-11 DIAGNOSIS — H6121 Impacted cerumen, right ear: Secondary | ICD-10-CM

## 2023-07-11 DIAGNOSIS — M25551 Pain in right hip: Secondary | ICD-10-CM

## 2023-07-11 DIAGNOSIS — Z0000 Encounter for general adult medical examination without abnormal findings: Secondary | ICD-10-CM | POA: Insufficient documentation

## 2023-07-11 DIAGNOSIS — E66813 Obesity, class 3: Secondary | ICD-10-CM

## 2023-07-11 DIAGNOSIS — M25561 Pain in right knee: Secondary | ICD-10-CM | POA: Diagnosis not present

## 2023-07-11 DIAGNOSIS — Z8249 Family history of ischemic heart disease and other diseases of the circulatory system: Secondary | ICD-10-CM

## 2023-07-11 DIAGNOSIS — I10 Essential (primary) hypertension: Secondary | ICD-10-CM | POA: Diagnosis not present

## 2023-07-11 DIAGNOSIS — Z6841 Body Mass Index (BMI) 40.0 and over, adult: Secondary | ICD-10-CM

## 2023-07-11 LAB — POCT URINALYSIS DIPSTICK
Bilirubin, UA: NEGATIVE
Glucose, UA: NEGATIVE
Ketones, UA: NEGATIVE
Leukocytes, UA: NEGATIVE
Nitrite, UA: NEGATIVE
Protein, UA: NEGATIVE
Spec Grav, UA: >=1.03 — AB (ref 1.010–1.025)
Urobilinogen, UA: 0.2 U/dL
pH, UA: 6 (ref 5.0–8.0)

## 2023-07-11 LAB — CBC
Hematocrit: 40.5 % (ref 34.0–46.6)
Hemoglobin: 12.1 g/dL (ref 11.1–15.9)
MCH: 24.1 pg — ABNORMAL LOW (ref 26.6–33.0)
MCHC: 29.9 g/dL — ABNORMAL LOW (ref 31.5–35.7)
MCV: 81 fL (ref 79–97)
Platelets: 377 10*3/uL (ref 150–450)
RBC: 5.03 x10E6/uL (ref 3.77–5.28)
RDW: 15.2 % (ref 11.7–15.4)
WBC: 9.2 10*3/uL (ref 3.4–10.8)

## 2023-07-11 LAB — CMP14+EGFR
ALT: 16 [IU]/L (ref 0–32)
AST: 14 [IU]/L (ref 0–40)
Albumin: 3.9 g/dL (ref 3.8–4.9)
Alkaline Phosphatase: 124 [IU]/L — ABNORMAL HIGH (ref 44–121)
BUN/Creatinine Ratio: 10 (ref 9–23)
BUN: 8 mg/dL (ref 6–24)
Bilirubin Total: 0.3 mg/dL (ref 0.0–1.2)
CO2: 28 mmol/L (ref 20–29)
Calcium: 9.5 mg/dL (ref 8.7–10.2)
Chloride: 97 mmol/L (ref 96–106)
Creatinine, Ser: 0.8 mg/dL (ref 0.57–1.00)
Globulin, Total: 3.6 g/dL (ref 1.5–4.5)
Glucose: 92 mg/dL (ref 70–99)
Potassium: 3.9 mmol/L (ref 3.5–5.2)
Sodium: 139 mmol/L (ref 134–144)
Total Protein: 7.5 g/dL (ref 6.0–8.5)
eGFR: 89 mL/min/{1.73_m2} (ref >59)

## 2023-07-11 LAB — LIPID PANEL
Chol/HDL Ratio: 4.1 {ratio} (ref 0.0–4.4)
Cholesterol, Total: 228 mg/dL — ABNORMAL HIGH (ref 100–199)
HDL: 56 mg/dL (ref >39)
LDL Chol Calc (NIH): 158 mg/dL — ABNORMAL HIGH (ref 0–99)
Triglycerides: 78 mg/dL (ref 0–149)
VLDL Cholesterol Cal: 14 mg/dL (ref 5–40)

## 2023-07-11 LAB — HEMOGLOBIN A1C
Est. average glucose Bld gHb Est-mCnc: 126 mg/dL
Hgb A1c MFr Bld: 6 % — ABNORMAL HIGH (ref 4.8–5.6)

## 2023-07-11 LAB — TSH: TSH: 1.93 u[IU]/mL (ref 0.450–4.500)

## 2023-07-11 NOTE — Assessment & Plan Note (Signed)
AFTER OBTAINING VERBAL CONSENT, RIGHT EAR WAS FLUSHED BY IRRIGATION. SHE TOLERATED PROCEDURE WELL WITHOUT ANY COMPLICATIONS. NO TM ABNORMALITIES WERE NOTED.

## 2023-07-11 NOTE — Progress Notes (Addendum)
 I,Victoria T Basil Lim, CMA,acting as a Neurosurgeon for Smiley Dung, MD.,have documented all relevant documentation on the behalf of Smiley Dung, MD,as directed by  Smiley Dung, MD while in the presence of Smiley Dung, MD.  Subjective:    Patient ID: Brooke [name] , female    DOB: Mar 06, 1971 , 53 y.o.   MRN: [id number]  Chief Complaint  Patient presents with   Annual Exam   Hypertension    HPI  The patient is here today for a physical examination. She is followed by Dr. Adrain Hopes at [place] for her GYN care. She is scheduled for her next appointment March 2025.  She reports compliance with medications. Denies headache, chest pain & sob.   Hypertension This is a chronic problem. The current episode started more than 1 year ago. The problem has been gradually improving since onset. The problem is controlled. Pertinent negatives include no blurred vision, chest pain, palpitations or shortness of breath. Past treatments include calcium channel blockers, beta blockers, angiotensin blockers and diuretics. The current treatment provides moderate improvement. There is no history of kidney disease, CVA or heart failure.     Past Medical History:  Diagnosis Date   ADD (attention deficit disorder)    Allergy    allergic rhinitis   Anemia    nos   Anxiety    Arthritis    right ankle   Asthma    as a child   Back pain    Constipation    Depression    Elevated blood pressure reading without diagnosis of hypertension    Family history of breast cancer    Family history of colon cancer    Family history of ovarian cancer    Family history of uterine cancer    Genital herpes    Lactose intolerance    Migraine    Prediabetes    Sleep apnea    Stress    Swelling      Family History  Problem Relation Age of Onset   Hypertension Mother    Diabetes Mother    Hyperlipidemia Mother    Hypertension Father    Sleep apnea Father    Alcoholism Father    Obesity Father    Breast  cancer Sister 14   Uterine cancer Sister 72   Diabetes Maternal Aunt    Diabetes Maternal Uncle    Heart failure Paternal Aunt    Diabetes Paternal Aunt    Lymphoma Paternal Aunt    Heart failure Maternal Grandmother    Heart failure Maternal Grandfather    Ovarian cancer Cousin        mat first cousin dx in her 72s   Colon cancer Cousin        pat first cousin dx in her 52s     Current Outpatient Medications:    amLODipine  (NORVASC ) 5 MG tablet, TAKE 1 TABLET(5 MG) BY MOUTH DAILY, Disp: 90 tablet, Rfl: 2   amphetamine -dextroamphetamine  (ADDERALL XR) 30 MG 24 hr capsule, Take 1 capsule (30 mg total) by mouth daily., Disp: 30 capsule, Rfl: 0   Multiple Vitamin (MULTIVITAMIN) tablet, Take 1 tablet by mouth daily., Disp: , Rfl:    nebivolol  (BYSTOLIC ) 10 MG tablet, Take 1 tablet (10 mg total) by mouth daily., Disp: 90 tablet, Rfl: 0   sertraline  (ZOLOFT ) 100 MG tablet, TAKE 1 TABLET(100 MG) BY MOUTH DAILY (Patient taking differently: TAKE 1 TABLET(150 MG) BY MOUTH DAILY), Disp: 90 tablet, Rfl: 1   valACYclovir  (  VALTREX ) 500 MG tablet, Take 1 tablet (500 mg total) by mouth as needed (genital herpes)., Disp: 90 tablet, Rfl: 0   valsartan -hydrochlorothiazide  (DIOVAN  HCT) 160-12.5 MG tablet, Take 1 tablet by mouth daily., Disp: 90 tablet, Rfl: 2   Vitamin D , Cholecalciferol, 25 MCG (1000 UT) TABS, Take 5,000 Units by mouth daily. , Disp: , Rfl:    zaleplon  (SONATA ) 10 MG capsule, Take 1 capsule (10 mg total) by mouth daily., Disp: 30 capsule, Rfl: 3   albuterol  (VENTOLIN  HFA) 108 (90 Base) MCG/ACT inhaler, Inhale 2 puffs into the lungs every 6 (six) hours as needed for wheezing or shortness of breath. (Patient not taking: Reported on 07/11/2023), Disp: 8 g, Rfl: 2   No Known Allergies    The patient states she uses IUD for birth control. No LMP recorded (exact date). (Menstrual status: IUD).. Negative for Dysmenorrhea. Negative for: breast discharge, breast lump(s), breast pain and breast self  exam. Associated symptoms include abnormal vaginal bleeding. Pertinent negatives include abnormal bleeding (hematology), anxiety, decreased libido, depression, difficulty falling sleep, dyspareunia, history of infertility, nocturia, sexual dysfunction, sleep disturbances, urinary incontinence, urinary urgency, vaginal discharge and vaginal itching. Diet regular.The patient states her exercise level is  minimal.  . The patient's tobacco use is:  Social History   Tobacco Use  Smoking Status Never  Smokeless Tobacco Never  . She has been exposed to passive smoke. The patient's alcohol use is:  Social History   Substance and Sexual Activity  Alcohol Use No   Comment: OCC    Review of Systems  Constitutional: Negative.   HENT: Negative.    Eyes: Negative.  Negative for blurred vision.  Respiratory: Negative.  Negative for shortness of breath.   Cardiovascular: Negative.  Negative for chest pain and palpitations.  Gastrointestinal: Negative.   Endocrine: Negative.   Genitourinary: Negative.   Musculoskeletal:  Positive for arthralgias.       She c/o generalized joint pains.  She has been eval by Ortho. Feels like she has arthritis all over. No associated myalgias. No fever, chills and unexplained weight loss.   Skin: Negative.   Allergic/Immunologic: Negative.   Hematological: Negative.   Psychiatric/Behavioral: Negative.       Today's Vitals   07/11/23 0849  BP: 134/82  Temp: 98.7 F (37.1 C)  SpO2: 98%  Weight: (!) 302 lb 12.8 oz (137.3 kg)  Height: 5\' 3"  (1.6 m)   Body mass index is 53.64 kg/m.  Wt Readings from Last 3 Encounters:  07/11/23 (!) 302 lb 12.8 oz (137.3 kg)  03/01/23 293 lb 6.4 oz (133.1 kg)  01/26/23 293 lb (132.9 kg)     Objective:  Physical Exam Vitals and nursing note reviewed.  Constitutional:      Appearance: Normal appearance. She is obese.     Comments: Examined while clothed in pants/shoes Seated in chair  HENT:     Head: Normocephalic and  atraumatic.     Right Ear: Ear canal and external ear normal. There is impacted cerumen.     Left Ear: Tympanic membrane, ear canal and external ear normal.     Nose: Nose normal.     Mouth/Throat:     Mouth: Mucous membranes are moist.     Pharynx: Oropharynx is clear.  Eyes:     Extraocular Movements: Extraocular movements intact.     Conjunctiva/sclera: Conjunctivae normal.     Pupils: Pupils are equal, round, and reactive to light.  Cardiovascular:     Rate and Rhythm:  Normal rate and regular rhythm.     Pulses: Normal pulses.     Heart sounds: Normal heart sounds.  Pulmonary:     Effort: Pulmonary effort is normal.     Breath sounds: Normal breath sounds.  Chest:  Breasts:    Tanner Score is 5.     Right: Normal.     Left: Normal.  Abdominal:     General: Bowel sounds are normal.     Palpations: Abdomen is soft.     Comments: Obese, difficult to assess organomegaly  Genitourinary:    Comments: deferred Musculoskeletal:        General: Normal range of motion.     Cervical back: Normal range of motion and neck supple.  Skin:    General: Skin is warm and dry.     Comments: Tattoo left arm  Neurological:     General: No focal deficit present.     Mental Status: She is alert and oriented to person, place, and time.  Psychiatric:        Mood and Affect: Mood normal.        Behavior: Behavior normal.         Assessment And Plan:     Encounter for general adult medical examination w/o abnormal findings Assessment & Plan: A full exam was performed.  Importance of monthly self breast exams was discussed with the patient.  She is advised to get 30-45 minutes of regular exercise, no less than four to five days per week. Both weight-bearing and aerobic exercises are recommended.  She is advised to follow a healthy diet with at least six fruits/veggies per day, decrease intake of red meat and other saturated fats and to increase fish intake to twice weekly.  Meats/fish should  not be fried -- baked, boiled or broiled is preferable. It is also important to cut back on your sugar intake.  Be sure to read labels - try to avoid anything with added sugar, high fructose corn syrup or other sweeteners.  If you must use a sweetener, you can try stevia or monkfruit.  It is also important to avoid artificially sweetened foods/beverages and diet drinks. Lastly, wear SPF 50 sunscreen on exposed skin and when in direct sunlight for an extended period of time.  Be sure to avoid fast food restaurants and aim for at least 60 ounces of water daily.       Essential hypertension Assessment & Plan: Chronic, fair control.  Goal BP<120/80.  EKG performed, NSR w/ voltage criteria for LVH, nonspecific T-abnormality.  She will continue with amlodipine  5mg  daily, nebivolol  10mg  and valsartan /hct 160/12.5mg  daily. We discussed the importance of stress management.  She is reminded to follow a low sodium diet. She will f/u in four months for re-evaluation.  Orders: -     POCT urinalysis dipstick -     Microalbumin / creatinine urine ratio -     EKG 12-Lead  Chronic arthralgias of knees and hips Assessment & Plan: Chronic, she is encouraged to follow anti-inflammatory diet free of sugary beverages, processed meats and packaged foods.  She may benefit from ginger/turmeric tea.   Orders: -     ANA, IFA (with reflex) -     CYCLIC CITRUL PEPTIDE ANTIBODY, IGG/IGA -     Rheumatoid factor -     Sedimentation rate -     Uric acid  Right ear impacted cerumen Assessment & Plan: AFTER OBTAINING VERBAL CONSENT, RIGHT EAR WAS FLUSHED BY IRRIGATION. SHE TOLERATED  PROCEDURE WELL WITHOUT ANY COMPLICATIONS. NO TM ABNORMALITIES WERE NOTED.   Orders: -     Ear Lavage  Class 3 severe obesity due to excess calories with serious comorbidity and body mass index (BMI) of 50.0 to 59.9 in adult [facility]) Assessment & Plan: Chronic, BMI 53.  She has gained 9lbs since September 2024.  She is encouraged to aim to  lose ten percent of her body weight to decrease cardiac risk. Advised to aim for at least 150 minutes of exercise/week, while also incorporating strength training at least twice weekly.    Family history of heart disease -     Lipoprotein A (LPA) -     CT CARDIAC SCORING (SELF PAY ONLY); Future  Immunization due Best boy Vaccine 56yrs & older  She is encouraged to strive for BMI less than 30 to decrease cardiac risk. Advised to aim for at least 150 minutes of exercise per week.    Return for 1 YEAR HM, 4 MONTH BPC. . Patient was given opportunity to ask questions. Patient verbalized understanding of the plan and was able to repeat key elements of the plan. All questions were answered to their satisfaction.    I, Smiley Dung, MD, have reviewed all documentation for this visit. The documentation on 07/11/23 for the exam, diagnosis, procedures, and orders are all accurate and complete.

## 2023-07-11 NOTE — Assessment & Plan Note (Signed)
 Previous labs reviewed, her A1c has been elevated in the past. I will check an A1c today. Reminded to avoid refined sugars including sugary drinks/foods and processed meats including bacon, sausages and deli meats.

## 2023-07-11 NOTE — Assessment & Plan Note (Signed)
 Chronic, she is encouraged to follow anti-inflammatory diet free of sugary beverages, processed meats and packaged foods.  She may benefit from ginger/turmeric tea.

## 2023-07-11 NOTE — Assessment & Plan Note (Signed)
 Chronic, fair control.  Goal BP<120/80.  EKG performed, NSR w/ voltage criteria for LVH, nonspecific T-abnormality.  She will continue with amlodipine  5mg  daily, nebivolol  10mg  and valsartan /hct 160/12.5mg  daily. We discussed the importance of stress management.  She is reminded to follow a low sodium diet. She will f/u in four months for re-evaluation.

## 2023-07-11 NOTE — Assessment & Plan Note (Signed)
 Chronic, BMI 53.  She has gained 9lbs since September 2024.  She is encouraged to aim to lose ten percent of her body weight to decrease cardiac risk. Advised to aim for at least 150 minutes of exercise/week, while also incorporating strength training at least twice weekly.

## 2023-07-11 NOTE — Patient Instructions (Signed)

## 2023-07-11 NOTE — Assessment & Plan Note (Signed)

## 2023-07-14 LAB — MICROALBUMIN / CREATININE URINE RATIO
Creatinine, Urine: 158.4 mg/dL
Microalb/Creat Ratio: 5 mg/g{creat} (ref 0–29)
Microalbumin, Urine: 7.8 ug/mL

## 2023-07-14 LAB — LIPOPROTEIN A (LPA): Lipoprotein (a): 175 nmol/L — ABNORMAL HIGH (ref <75.0)

## 2023-07-14 LAB — SEDIMENTATION RATE: Sed Rate: 56 mm/h — ABNORMAL HIGH (ref 0–40)

## 2023-07-14 LAB — CYCLIC CITRUL PEPTIDE ANTIBODY, IGG/IGA: Cyclic Citrullin Peptide Ab: 4 U (ref 0–19)

## 2023-07-14 LAB — URIC ACID: Uric Acid: 6.7 mg/dL (ref 3.0–7.2)

## 2023-07-14 LAB — RHEUMATOID FACTOR: Rheumatoid fact SerPl-aCnc: <10 [IU]/mL (ref <14.0)

## 2023-07-14 LAB — ANTINUCLEAR ANTIBODIES, IFA: ANA Titer 1: NEGATIVE

## 2023-07-19 ENCOUNTER — Other Ambulatory Visit (HOSPITAL_COMMUNITY): Payer: Self-pay

## 2023-07-19 MED ORDER — SERTRALINE HCL 100 MG PO TABS
150.0000 mg | ORAL_TABLET | Freq: Every morning | ORAL | 1 refills | Status: DC
  Filled 2023-07-19 – 2023-07-20 (×2): qty 135, 90d supply, fill #0

## 2023-07-19 MED ORDER — AMPHETAMINE-DEXTROAMPHET ER 30 MG PO CP24
30.0000 mg | ORAL_CAPSULE | Freq: Every day | ORAL | 0 refills | Status: DC
  Filled 2023-07-19 – 2023-08-08 (×2): qty 30, 30d supply, fill #0

## 2023-07-19 MED ORDER — AMPHETAMINE-DEXTROAMPHET ER 30 MG PO CP24: 30.0000 mg | ORAL_CAPSULE | Freq: Every day | ORAL | 0 refills | Status: DC

## 2023-07-19 MED ORDER — ZALEPLON 10 MG PO CAPS
10.0000 mg | ORAL_CAPSULE | Freq: Every day | ORAL | 3 refills | Status: DC
  Filled 2023-07-19: qty 30, 30d supply, fill #0

## 2023-07-20 ENCOUNTER — Other Ambulatory Visit (HOSPITAL_COMMUNITY): Payer: Self-pay

## 2023-07-24 ENCOUNTER — Inpatient Hospital Stay (HOSPITAL_COMMUNITY): Admission: RE | Admit: 2023-07-24 | Payer: Self-pay | Source: Ambulatory Visit

## 2023-07-27 ENCOUNTER — Ambulatory Visit (HOSPITAL_COMMUNITY)
Admission: RE | Admit: 2023-07-27 | Discharge: 2023-07-27 | Disposition: A | Discharge status: Home / Self Care | Payer: Self-pay | Source: Ambulatory Visit | Attending: Internal Medicine | Admitting: Internal Medicine

## 2023-07-27 DIAGNOSIS — Z8249 Family history of ischemic heart disease and other diseases of the circulatory system: Secondary | ICD-10-CM

## 2023-08-08 ENCOUNTER — Other Ambulatory Visit (HOSPITAL_COMMUNITY): Payer: Self-pay

## 2023-08-09 ENCOUNTER — Other Ambulatory Visit: Payer: Self-pay

## 2023-09-05 ENCOUNTER — Encounter: Payer: Self-pay | Admitting: Internal Medicine

## 2023-09-12 ENCOUNTER — Other Ambulatory Visit (HOSPITAL_COMMUNITY): Payer: Self-pay

## 2023-09-12 MED ORDER — AMPHETAMINE-DEXTROAMPHET ER 30 MG PO CP24
30.0000 mg | ORAL_CAPSULE | Freq: Every day | ORAL | 0 refills | Status: DC
  Filled 2023-09-12: qty 30, 30d supply, fill #0

## 2023-09-12 MED ORDER — ZALEPLON 10 MG PO CAPS
10.0000 mg | ORAL_CAPSULE | Freq: Every day | ORAL | 3 refills | Status: DC
  Filled 2023-09-12: qty 30, 30d supply, fill #0

## 2023-09-24 ENCOUNTER — Other Ambulatory Visit (HOSPITAL_COMMUNITY): Payer: Self-pay

## 2023-09-24 ENCOUNTER — Other Ambulatory Visit: Payer: Self-pay

## 2023-09-24 MED ORDER — ZALEPLON 10 MG PO CAPS: 10.0000 mg | ORAL_CAPSULE | Freq: Every day | ORAL | 3 refills | Status: DC

## 2023-09-24 MED ORDER — AMPHETAMINE-DEXTROAMPHET ER 30 MG PO CP24: 30.0000 mg | ORAL_CAPSULE | Freq: Every day | ORAL | 0 refills | Status: DC

## 2023-09-24 MED ORDER — SERTRALINE HCL 150 MG PO CAPS
150.0000 mg | ORAL_CAPSULE | Freq: Every morning | ORAL | 1 refills | Status: DC
  Filled 2023-09-24: qty 90, 90d supply, fill #0

## 2023-10-25 ENCOUNTER — Other Ambulatory Visit: Payer: Self-pay | Admitting: Internal Medicine

## 2023-10-25 DIAGNOSIS — I10 Essential (primary) hypertension: Secondary | ICD-10-CM

## 2023-10-31 ENCOUNTER — Other Ambulatory Visit (HOSPITAL_COMMUNITY): Payer: Self-pay

## 2023-10-31 ENCOUNTER — Other Ambulatory Visit: Payer: Self-pay

## 2023-10-31 MED ORDER — ZALEPLON 10 MG PO CAPS
10.0000 mg | ORAL_CAPSULE | Freq: Every day | ORAL | 3 refills | Status: DC
Start: 2023-10-31 — End: 2024-01-28
  Filled 2023-10-31: qty 30, 30d supply, fill #0
  Filled 2023-11-02: qty 15, 15d supply, fill #0
  Filled 2023-11-28: qty 30, 30d supply, fill #1
  Filled 2023-11-28: qty 15, 15d supply, fill #1

## 2023-10-31 MED ORDER — AMPHETAMINE-DEXTROAMPHET ER 30 MG PO CP24
30.0000 mg | ORAL_CAPSULE | Freq: Every day | ORAL | 0 refills | Status: DC
Start: 2023-10-31 — End: 2023-11-13
  Filled 2023-10-31: qty 30, 30d supply, fill #0

## 2023-10-31 MED ORDER — SERTRALINE HCL 100 MG PO TABS
150.0000 mg | ORAL_TABLET | Freq: Every day | ORAL | 1 refills | Status: AC
  Filled 2023-10-31: qty 135, 90d supply, fill #0

## 2023-11-02 ENCOUNTER — Other Ambulatory Visit (HOSPITAL_COMMUNITY): Payer: Self-pay

## 2023-11-02 ENCOUNTER — Other Ambulatory Visit: Payer: Self-pay

## 2023-11-08 ENCOUNTER — Other Ambulatory Visit: Payer: Self-pay | Admitting: Internal Medicine

## 2023-11-08 DIAGNOSIS — I10 Essential (primary) hypertension: Secondary | ICD-10-CM

## 2023-11-13 ENCOUNTER — Ambulatory Visit: Payer: 59 | Admitting: Internal Medicine

## 2023-11-13 ENCOUNTER — Encounter: Payer: Self-pay | Admitting: Internal Medicine

## 2023-11-13 VITALS — BP 110/88 | HR 72 | Temp 98.7°F | Ht 63.0 in | Wt 295.0 lb

## 2023-11-13 DIAGNOSIS — R7309 Other abnormal glucose: Secondary | ICD-10-CM

## 2023-11-13 DIAGNOSIS — F99 Mental disorder, not otherwise specified: Secondary | ICD-10-CM

## 2023-11-13 DIAGNOSIS — M79673 Pain in unspecified foot: Secondary | ICD-10-CM

## 2023-11-13 DIAGNOSIS — I10 Essential (primary) hypertension: Secondary | ICD-10-CM

## 2023-11-13 DIAGNOSIS — F988 Other specified behavioral and emotional disorders with onset usually occurring in childhood and adolescence: Secondary | ICD-10-CM

## 2023-11-13 DIAGNOSIS — F331 Major depressive disorder, recurrent, moderate: Secondary | ICD-10-CM | POA: Diagnosis not present

## 2023-11-13 DIAGNOSIS — F5105 Insomnia due to other mental disorder: Secondary | ICD-10-CM

## 2023-11-13 LAB — BMP8+EGFR
BUN/Creatinine Ratio: 17 (ref 9–23)
BUN: 14 mg/dL (ref 6–24)
CO2: 24 mmol/L (ref 20–29)
Calcium: 9.7 mg/dL (ref 8.7–10.2)
Chloride: 100 mmol/L (ref 96–106)
Creatinine, Ser: 0.84 mg/dL (ref 0.57–1.00)
Glucose: 86 mg/dL (ref 70–99)
Potassium: 3.8 mmol/L (ref 3.5–5.2)
Sodium: 141 mmol/L (ref 134–144)
eGFR: 83 mL/min/{1.73_m2} (ref >59)

## 2023-11-13 LAB — HEMOGLOBIN A1C
Est. average glucose Bld gHb Est-mCnc: 126 mg/dL
Hgb A1c MFr Bld: 6 % — ABNORMAL HIGH (ref 4.8–5.6)

## 2023-11-13 MED ORDER — NEBIVOLOL HCL 10 MG PO TABS: 10.0000 mg | ORAL_TABLET | Freq: Every day | ORAL | 1 refills | Status: DC

## 2023-11-13 NOTE — Progress Notes (Signed)
 I,Brooke [name], CMA,acting as a Neurosurgeon for Brooke Dung, MD.,have documented all relevant documentation on the behalf of Brooke Dung, MD,as directed by  Brooke Dung, MD while in the presence of Brooke Dung, MD.  Subjective:  Patient ID: Brooke [name] , female    DOB: 04-Mar-1971 , 53 y.o.   MRN: [id number]  Chief Complaint  Patient presents with   Hypertension    Pt presents today for a bpc. Pt would like to speak about weight loss options covered by her insurance. Denies headaches, chest pain & sob     HPI Discussed the use of AI scribe software for clinical note transcription with the patient, who gave verbal consent to proceed.  History of Present Illness Brooke [name] is a 53 year old female with hypertension who presents for a blood pressure check.  She is currently managing her hypertension with valsartan  160 mg/12.5 mg daily and Bystolic  10 mg. She experiences issues with medication refills, receiving them every ten days and sometimes only partially. She also takes sertraline  150 mg, which she manages by breaking a 100 mg tablet due to cost, and Adderall, prescribed by another physician.  She recently underwent a Pap smear followed by a colposcopy, during which four or five samples were taken. She was informed of the presence of uterine cells in her cervix and is scheduled for another biopsy. She has no irregular bleeding and has a Mirena IUD in place.  She has a history of a fall resulting in a dislocated toe and torn ligaments and tendons, causing her toes to overlap and significant ankle pain. An orthopedic specialist suggested surgery involving pins, but she is hesitant due to the recovery process. She wears [employer], commercial for stability and has custom inserts.  She has lost seven pounds since her last visit by reducing sweets but does not engage in much exercise due to foot pain. She is considering water exercises at the [facility]. She experiences difficulty  with balance and strength, particularly in her ankles, and is exploring options to improve her physical activity level.   Hypertension This is a chronic problem. The current episode started more than 1 year ago. The problem has been gradually improving since onset. The problem is controlled. Pertinent negatives include no blurred vision, chest pain, palpitations or shortness of breath. Risk factors for coronary artery disease include obesity. Past treatments include diuretics, beta blockers and calcium channel blockers. The current treatment provides mild improvement. Compliance problems include exercise.      Past Medical History:  Diagnosis Date   ADD (attention deficit disorder)    Allergy    allergic rhinitis   Anemia    nos   Anxiety    Arthritis    right ankle   Asthma    as a child   Back pain    Constipation    Depression    Elevated blood pressure reading without diagnosis of hypertension    Family history of breast cancer    Family history of colon cancer    Family history of ovarian cancer    Family history of uterine cancer    Genital herpes    Lactose intolerance    Migraine    Prediabetes    Sleep apnea    Stress    Swelling      Family History  Problem Relation Age of Onset   Hypertension Mother    Diabetes Mother    Hyperlipidemia Mother  Hypertension Father    Sleep apnea Father    Alcoholism Father    Obesity Father    Breast cancer Sister 11   Uterine cancer Sister 10   Diabetes Maternal Aunt    Diabetes Maternal Uncle    Heart failure Paternal Aunt    Diabetes Paternal Aunt    Lymphoma Paternal Aunt    Heart failure Maternal Grandmother    Heart failure Maternal Grandfather    Ovarian cancer Cousin        mat first cousin dx in her 57s   Colon cancer Cousin        pat first cousin dx in her 87s     Current Outpatient Medications:    amLODipine  (NORVASC ) 5 MG tablet, TAKE 1 TABLET(5 MG) BY MOUTH DAILY, Disp: 90 tablet, Rfl: 2    [START ON 11/22/2023] amphetamine -dextroamphetamine  (ADDERALL XR) 30 MG 24 hr capsule, Take 1 capsule (30 mg total) by mouth daily., Disp: 30 capsule, Rfl: 0   Multiple Vitamin (MULTIVITAMIN) tablet, Take 1 tablet by mouth daily., Disp: , Rfl:    sertraline  (ZOLOFT ) 100 MG tablet, Take 1&1/2 tablets (150 mg total) by mouth daily., Disp: 135 tablet, Rfl: 1   valACYclovir  (VALTREX ) 500 MG tablet, Take 1 tablet (500 mg total) by mouth as needed (genital herpes)., Disp: 90 tablet, Rfl: 0   valsartan -hydrochlorothiazide  (DIOVAN  HCT) 160-12.5 MG tablet, Take 1 tablet by mouth daily., Disp: 90 tablet, Rfl: 2   Vitamin D , Cholecalciferol, 25 MCG (1000 UT) TABS, Take 5,000 Units by mouth daily. , Disp: , Rfl:    zaleplon  (SONATA ) 10 MG capsule, Take 1 capsule (10 mg total) by mouth daily., Disp: 30 capsule, Rfl: 3   albuterol  (VENTOLIN  HFA) 108 (90 Base) MCG/ACT inhaler, Inhale 2 puffs into the lungs every 6 (six) hours as needed for wheezing or shortness of breath. (Patient not taking: Reported on 11/13/2023), Disp: 8 g, Rfl: 2   nebivolol  (BYSTOLIC ) 10 MG tablet, Take 1 tablet (10 mg total) by mouth daily., Disp: 90 tablet, Rfl: 1   No Known Allergies   Review of Systems  Constitutional: Negative.   Eyes:  Negative for blurred vision.  Respiratory: Negative.  Negative for shortness of breath.   Cardiovascular: Negative.  Negative for chest pain and palpitations.  Gastrointestinal: Negative.   Musculoskeletal:  Positive for arthralgias.  Neurological: Negative.   Psychiatric/Behavioral:  Positive for sleep disturbance.      Today's Vitals   11/13/23 0953 11/13/23 1010  BP: 110/88 110/88  Pulse: 72   Temp: 98.7 F (37.1 C)   SpO2: 98%   Weight: 295 lb (133.8 kg)   Height: 5\' 3"  (1.6 m)    Body mass index is 52.26 kg/m.  Wt Readings from Last 3 Encounters:  11/13/23 295 lb (133.8 kg)  07/11/23 (!) 302 lb 12.8 oz (137.3 kg)  03/01/23 293 lb 6.4 oz (133.1 kg)     Objective:  Physical  Exam Vitals and nursing note reviewed.  Constitutional:      Appearance: Normal appearance. She is obese.  HENT:     Head: Normocephalic and atraumatic.  Eyes:     Extraocular Movements: Extraocular movements intact.  Cardiovascular:     Rate and Rhythm: Normal rate and regular rhythm.     Heart sounds: Normal heart sounds.  Pulmonary:     Effort: Pulmonary effort is normal.     Breath sounds: Normal breath sounds.  Musculoskeletal:     Cervical back: Normal range of motion.  Skin:    General: Skin is warm.  Neurological:     General: No focal deficit present.     Mental Status: She is alert.  Psychiatric:        Mood and Affect: Mood normal.        Behavior: Behavior normal.       Assessment And Plan:  Essential hypertension Assessment & Plan: Chronic, fair control.  Goal BP<120/80.   She will continue with amlodipine  5mg  daily, nebivolol  10mg  and valsartan /hct 160/12.5mg  daily. We discussed the importance of stress management.  She is reminded to follow a low sodium diet. She will f/u in four months for re-evaluation.  Orders: -     Nebivolol  HCl; Take 1 tablet (10 mg total) by mouth daily.  Dispense: 90 tablet; Refill: 1 -     BMP8+EGFR  Other abnormal glucose Assessment & Plan: Previous labs reviewed, her A1c has been elevated in the past. I will check an A1c today. Reminded to avoid refined sugars including sugary drinks/foods and processed meats including bacon, sausages and deli meats.    Orders: -     Hemoglobin A1c -     BMP8+EGFR  Moderate episode of recurrent major depressive disorder (HCC) Assessment & Plan: Chronic, currently followed by Psych. She will continue with sertraline  150mg  daily.    ADD (attention deficit disorder) without hyperactivity Assessment & Plan: ADHD managed with Adderall prescribed by Dr. Dolan Freiberg. Multiple chart entries for Adderall require consolidation. - Continue Adderall as prescribed by Dr. Dolan Freiberg. - Consolidate duplicate  Adderall entries in chart.   Pain of foot, unspecified laterality Assessment & Plan: Foot pain linked to previous injury with toe dislocation and ligament/tendon damage. Orthopedic consultation suggested intensive surgery with uncertain success. Podiatric evaluation for nonsurgical options not pursued. - Refer to podiatry for nonsurgical therapy evaluation. - Encourage non-weight bearing exercises: cycling, aquatic exercises. - Explore early morning aquatic classes at [facility] - Consider cycling for exercise. - Engage in home exercises during TV commercials for muscle strength and balance.   Insomnia due to other mental disorder Assessment & Plan: Insomnia managed with Sonata  prescribed by Dr. Dolan Freiberg. Multiple chart entries for Sonata  require consolidation. - Continue Sonata  as prescribed by Dr. Dolan Freiberg. - Consolidate duplicate Sonata  entries in chart.     Return for 4 mon f/u for bpc .  Patient was given opportunity to ask questions. Patient verbalized understanding of the plan and was able to repeat key elements of the plan. All questions were answered to their satisfaction.    I, Brooke Dung, MD, have reviewed all documentation for this visit. The documentation on 11/13/23 for the exam, diagnosis, procedures, and orders are all accurate and complete.   IF YOU HAVE BEEN REFERRED TO A SPECIALIST, IT MAY TAKE 1-2 WEEKS TO SCHEDULE/PROCESS THE REFERRAL. IF YOU HAVE NOT HEARD FROM US /SPECIALIST IN TWO WEEKS, PLEASE GIVE US  A CALL AT [phone].   THE PATIENT IS ENCOURAGED TO PRACTICE SOCIAL DISTANCING DUE TO THE COVID-19 PANDEMIC.

## 2023-11-13 NOTE — Patient Instructions (Signed)
 Hypertension, Adult Hypertension is another name for high blood pressure. High blood pressure forces your heart to work harder to pump blood. This can cause problems over time. There are two numbers in a blood pressure reading. There is a top number (systolic) over a bottom number (diastolic). It is best to have a blood pressure that is below 120/80. What are the causes? The cause of this condition is not known. Some other conditions can lead to high blood pressure. What increases the risk? Some lifestyle factors can make you more likely to develop high blood pressure: Smoking. Not getting enough exercise or physical activity. Being overweight. Having too much fat, sugar, calories, or salt (sodium) in your diet. Drinking too much alcohol. Other risk factors include: Having any of these conditions: Heart disease. Diabetes. High cholesterol. Kidney disease. Obstructive sleep apnea. Having a family history of high blood pressure and high cholesterol. Age. The risk increases with age. Stress. What are the signs or symptoms? High blood pressure may not cause symptoms. Very high blood pressure (hypertensive crisis) may cause: Headache. Fast or uneven heartbeats (palpitations). Shortness of breath. Nosebleed. Vomiting or feeling like you may vomit (nauseous). Changes in how you see. Very bad chest pain. Feeling dizzy. Seizures. How is this treated? This condition is treated by making healthy lifestyle changes, such as: Eating healthy foods. Exercising more. Drinking less alcohol. Your doctor may prescribe medicine if lifestyle changes do not help enough and if: Your top number is above 130. Your bottom number is above 80. Your personal target blood pressure may vary. Follow these instructions at home: Eating and drinking  If told, follow the DASH eating plan. To follow this plan: Fill one half of your plate at each meal with fruits and vegetables. Fill one fourth of your plate  at each meal with whole grains. Whole grains include whole-wheat pasta, brown rice, and whole-grain bread. Eat or drink low-fat dairy products, such as skim milk or low-fat yogurt. Fill one fourth of your plate at each meal with low-fat (lean) proteins. Low-fat proteins include fish, chicken without skin, eggs, beans, and tofu. Avoid fatty meat, cured and processed meat, or chicken with skin. Avoid pre-made or processed food. Limit the amount of salt in your diet to less than 1,500 mg each day. Do not drink alcohol if: Your doctor tells you not to drink. You are pregnant, may be pregnant, or are planning to become pregnant. If you drink alcohol: Limit how much you have to: 0-1 drink a day for women. 0-2 drinks a day for men. Know how much alcohol is in your drink. In the [place] one drink equals one 12 oz bottle of beer (355 mL), one 5 oz glass of wine (148 mL), or one 1 oz glass of hard liquor (44 mL). Lifestyle  Work with your doctor to stay at a healthy weight or to lose weight. Ask your doctor what the best weight is for you. Get at least 30 minutes of exercise that causes your heart to beat faster (aerobic exercise) most days of the week. This may include walking, swimming, or biking. Get at least 30 minutes of exercise that strengthens your muscles (resistance exercise) at least 3 days a week. This may include lifting weights or doing Pilates. Do not smoke or use any products that contain nicotine or tobacco. If you need help quitting, ask your doctor. Check your blood pressure at home as told by your doctor. Keep all follow-up visits. Medicines Take over-the-counter and prescription medicines  only as told by your doctor. Follow directions carefully. Do not skip doses of blood pressure medicine. The medicine does not work as well if you skip doses. Skipping doses also puts you at risk for problems. Ask your doctor about side effects or reactions to medicines that you should watch  for. Contact a doctor if: You think you are having a reaction to the medicine you are taking. You have headaches that keep coming back. You feel dizzy. You have swelling in your ankles. You have trouble with your vision. Get help right away if: You get a very bad headache. You start to feel mixed up (confused). You feel weak or numb. You feel faint. You have very bad pain in your: Chest. Belly (abdomen). You vomit more than once. You have trouble breathing. These symptoms may be an emergency. Get help right away. Call 911. Do not wait to see if the symptoms will go away. Do not drive yourself to the hospital. Summary Hypertension is another name for high blood pressure. High blood pressure forces your heart to work harder to pump blood. For most people, a normal blood pressure is less than 120/80. Making healthy choices can help lower blood pressure. If your blood pressure does not get lower with healthy choices, you may need to take medicine. This information is not intended to replace advice given to you by your health care provider. Make sure you discuss any questions you have with your health care provider. Document Revised: 03/31/2021 Document Reviewed: 03/31/2021 Elsevier Patient Education  2024 [employer].

## 2023-11-15 ENCOUNTER — Ambulatory Visit: Payer: Self-pay | Admitting: Internal Medicine

## 2023-11-17 DIAGNOSIS — M79673 Pain in unspecified foot: Secondary | ICD-10-CM | POA: Insufficient documentation

## 2023-11-17 NOTE — Assessment & Plan Note (Signed)
 ADHD managed with Adderall prescribed by Dr. Dolan Freiberg. Multiple chart entries for Adderall require consolidation. - Continue Adderall as prescribed by Dr. Dolan Freiberg. - Consolidate duplicate Adderall entries in chart.

## 2023-11-17 NOTE — Assessment & Plan Note (Signed)
 Insomnia managed with Sonata  prescribed by Dr. Dolan Freiberg. Multiple chart entries for Sonata  require consolidation. - Continue Sonata  as prescribed by Dr. Dolan Freiberg. - Consolidate duplicate Sonata  entries in chart.

## 2023-11-17 NOTE — Assessment & Plan Note (Signed)
 Chronic, fair control.  Goal BP<120/80.   She will continue with amlodipine  5mg  daily, nebivolol  10mg  and valsartan /hct 160/12.5mg  daily. We discussed the importance of stress management.  She is reminded to follow a low sodium diet. She will f/u in four months for re-evaluation.

## 2023-11-17 NOTE — Assessment & Plan Note (Signed)
 Previous labs reviewed, her A1c has been elevated in the past. I will check an A1c today. Reminded to avoid refined sugars including sugary drinks/foods and processed meats including bacon, sausages and deli meats.

## 2023-11-17 NOTE — Assessment & Plan Note (Signed)
Chronic, currently followed by Psych. She will continue with sertraline 150mg  daily.

## 2023-11-17 NOTE — Assessment & Plan Note (Signed)
 Foot pain linked to previous injury with toe dislocation and ligament/tendon damage. Orthopedic consultation suggested intensive surgery with uncertain success. Podiatric evaluation for nonsurgical options not pursued. - Refer to podiatry for nonsurgical therapy evaluation. - Encourage non-weight bearing exercises: cycling, aquatic exercises. - Explore early morning aquatic classes at [facility]. - Consider cycling for exercise. - Engage in home exercises during TV commercials for muscle strength and balance.

## 2023-11-28 ENCOUNTER — Other Ambulatory Visit (HOSPITAL_COMMUNITY): Payer: Self-pay

## 2023-12-12 ENCOUNTER — Other Ambulatory Visit (HOSPITAL_COMMUNITY): Payer: Self-pay

## 2023-12-12 MED ORDER — AMPHETAMINE-DEXTROAMPHET ER 30 MG PO CP24
30.0000 mg | ORAL_CAPSULE | Freq: Every day | ORAL | 0 refills | Status: DC
  Filled 2023-12-12: qty 30, 30d supply, fill #0

## 2023-12-13 ENCOUNTER — Other Ambulatory Visit: Payer: Self-pay

## 2023-12-31 ENCOUNTER — Other Ambulatory Visit (HOSPITAL_COMMUNITY): Payer: Self-pay

## 2023-12-31 MED ORDER — ZALEPLON 10 MG PO CAPS
10.0000 mg | ORAL_CAPSULE | Freq: Every day | ORAL | 3 refills | Status: DC
  Filled 2023-12-31: qty 30, 30d supply, fill #0

## 2023-12-31 MED ORDER — AMPHETAMINE-DEXTROAMPHET ER 30 MG PO CP24: 30.0000 mg | ORAL_CAPSULE | Freq: Every day | ORAL | 0 refills | Status: DC

## 2024-01-01 ENCOUNTER — Other Ambulatory Visit (HOSPITAL_COMMUNITY): Payer: Self-pay

## 2024-01-21 ENCOUNTER — Other Ambulatory Visit (HOSPITAL_COMMUNITY): Payer: Self-pay

## 2024-01-21 MED ORDER — AMPHETAMINE-DEXTROAMPHET ER 30 MG PO CP24
30.0000 mg | ORAL_CAPSULE | Freq: Every day | ORAL | 0 refills | Status: AC
  Filled 2024-02-22: qty 30, 30d supply, fill #0

## 2024-01-21 MED ORDER — SERTRALINE HCL 100 MG PO TABS
150.0000 mg | ORAL_TABLET | Freq: Every day | ORAL | 1 refills | Status: AC
  Filled 2024-01-21: qty 135, 90d supply, fill #0

## 2024-01-21 MED ORDER — ZALEPLON 10 MG PO CAPS
10.0000 mg | ORAL_CAPSULE | Freq: Every day | ORAL | 3 refills | Status: AC
  Filled 2024-01-24 – 2024-02-14 (×2): qty 30, 30d supply, fill #0
  Filled 2024-04-04: qty 30, 30d supply, fill #1
  Filled 2024-04-11: qty 15, 15d supply, fill #1
  Filled 2024-05-13: qty 15, 15d supply, fill #2

## 2024-01-21 MED ORDER — AMPHETAMINE-DEXTROAMPHET ER 30 MG PO CP24
30.0000 mg | ORAL_CAPSULE | Freq: Every day | ORAL | 0 refills | Status: DC
  Filled 2024-04-11: qty 30, 30d supply, fill #0

## 2024-01-21 MED ORDER — AMPHETAMINE-DEXTROAMPHET ER 30 MG PO CP24
30.0000 mg | ORAL_CAPSULE | Freq: Every day | ORAL | 0 refills | Status: DC
  Filled 2024-01-21: qty 30, 30d supply, fill #0

## 2024-01-24 ENCOUNTER — Other Ambulatory Visit (HOSPITAL_COMMUNITY): Payer: Self-pay

## 2024-01-28 ENCOUNTER — Ambulatory Visit: Admitting: Internal Medicine

## 2024-01-28 VITALS — BP 114/80 | HR 64 | Temp 98.2°F | Ht 63.0 in | Wt 298.0 lb

## 2024-01-28 DIAGNOSIS — I10 Essential (primary) hypertension: Secondary | ICD-10-CM

## 2024-01-28 DIAGNOSIS — F988 Other specified behavioral and emotional disorders with onset usually occurring in childhood and adolescence: Secondary | ICD-10-CM

## 2024-01-28 DIAGNOSIS — Z6841 Body Mass Index (BMI) 40.0 and over, adult: Secondary | ICD-10-CM | POA: Diagnosis not present

## 2024-01-28 DIAGNOSIS — R7309 Other abnormal glucose: Secondary | ICD-10-CM | POA: Diagnosis not present

## 2024-01-28 DIAGNOSIS — Z23 Encounter for immunization: Secondary | ICD-10-CM

## 2024-01-28 DIAGNOSIS — E7800 Pure hypercholesterolemia, unspecified: Secondary | ICD-10-CM | POA: Diagnosis not present

## 2024-01-28 DIAGNOSIS — E66813 Obesity, class 3: Secondary | ICD-10-CM | POA: Diagnosis not present

## 2024-01-28 NOTE — Progress Notes (Unsigned)
 I,Jameka J Llittleton, CMA,acting as a Neurosurgeon for Catheryn LOISE Slocumb, MD.,have documented all relevant documentation on the behalf of Catheryn LOISE Slocumb, MD,as directed by  Catheryn LOISE Slocumb, MD while in the presence of Catheryn LOISE Slocumb, MD.  Subjective:  Patient ID: Brooke [name] , female    DOB: May 11, 1971 , 53 y.o.   MRN: [id number]  Chief Complaint  Patient presents with   Hypertension    Patient presents today for bpc. Patient reports compliance with her meds. Patient denies having any chest pain,sob or headaches. Patient doesn't have any questions or concerns at this time.     HPI Discussed the use of AI scribe software for clinical note transcription with the patient, who gave verbal consent to proceed.  History of Present Illness Brooke [name] is a 53 year old female who presents for a blood pressure check.  She reports compliance with meds and hopes to discontinue her blood pressure medications upon retirement.   Her low-density lipoprotein (LDL) cholesterol was noted to be between 156 and 158 mg/dL. She has increased her walking and exercise to help manage her cholesterol levels. 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 She admits to not eating enough fruits and vegetables, often opting for sandwiches at work, and plans to start meal prepping to improve her diet.  She is currently on Adderall, which was switched from Vyvanse . She takes sertraline  at a dose of 150 mg daily, increased from 100 mg. She also takes melatonin, about 30 to 40 mg nightly, to aid with sleep. She uses a medication starting with 'Z' for sleep, which she finds effective, taking it Sunday through Thursday, allowing herself to sleep more on weekends when she does not take Adderall.  She has not traveled recently, having postponed a planned trip. She is busy with family events, including planning a baby shower for her sister. 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   Hypertension This is a chronic problem. The current episode started more than 1 year ago. The problem has been gradually improving since onset. The problem is controlled. Pertinent negatives include no blurred vision, chest pain, palpitations or shortness of breath. Risk factors for coronary artery disease include obesity. Past treatments include diuretics, beta blockers and calcium channel blockers. The current treatment provides mild improvement. Compliance problems include exercise.      Past Medical History:  Diagnosis Date   ADD (attention deficit disorder)    Allergy    allergic rhinitis   Anemia    nos   Anxiety    Arthritis    right ankle   Asthma    as a child   Back pain    Constipation    Depression    Elevated blood pressure reading without diagnosis of hypertension    Family history of breast cancer    Family history of colon cancer    Family history of ovarian cancer    Family history of uterine cancer    Genital herpes    Lactose intolerance    Migraine    Prediabetes    Sleep apnea    Stress    Swelling      Family History  Problem Relation Age of Onset   Hypertension Mother    Diabetes Mother    Hyperlipidemia Mother    Hypertension Father  Sleep apnea Father    Alcoholism Father    Obesity Father    Breast cancer Sister 40   Uterine cancer Sister 74   Diabetes Maternal Aunt    Diabetes Maternal Uncle    Heart failure Paternal Aunt    Diabetes Paternal Aunt    Lymphoma Paternal Aunt    Heart failure Maternal Grandmother    Heart failure Maternal Grandfather    Ovarian cancer Cousin        mat first cousin dx in her 85s   Colon cancer Cousin        pat first cousin dx in her 69s     Current Outpatient Medications:    albuterol  (VENTOLIN  HFA) 108 (90 Base) MCG/ACT inhaler, Inhale 2  puffs into the lungs every 6 (six) hours as needed for wheezing or shortness of breath. (Patient not taking: Reported on 11/13/2023), Disp: 8 g, Rfl: 2   amLODipine  (NORVASC ) 5 MG tablet, TAKE 1 TABLET(5 MG) BY MOUTH DAILY, Disp: 90 tablet, Rfl: 2   amphetamine -dextroamphetamine  (ADDERALL XR) 30 MG 24 hr capsule, Take 1 capsule (30 mg total) by mouth daily., Disp: 30 capsule, Rfl: 0   [START ON 02/21/2024] amphetamine -dextroamphetamine  (ADDERALL XR) 30 MG 24 hr capsule, Take 1 capsule (30 mg total) by mouth daily., Disp: 30 capsule, Rfl: 0   amphetamine -dextroamphetamine  (ADDERALL XR) 30 MG 24 hr capsule, Take 1 capsule (30 mg total) by mouth daily., Disp: 30 capsule, Rfl: 0   [START ON 03/23/2024] amphetamine -dextroamphetamine  (ADDERALL XR) 30 MG 24 hr capsule, Take 1 capsule (30 mg total) by mouth daily., Disp: 30 capsule, Rfl: 0   Multiple Vitamin (MULTIVITAMIN) tablet, Take 1 tablet by mouth daily., Disp: , Rfl:    nebivolol  (BYSTOLIC ) 10 MG tablet, Take 1 tablet (10 mg total) by mouth daily., Disp: 90 tablet, Rfl: 1   sertraline  (ZOLOFT ) 100 MG tablet, Take 1&1/2 tablets (150 mg total) by mouth daily., Disp: 135 tablet, Rfl: 1   sertraline  (ZOLOFT ) 100 MG tablet, Take 1 & 1/2 tablets (150 mg total) by mouth daily., Disp: 135 tablet, Rfl: 1   valACYclovir  (VALTREX ) 500 MG tablet, Take 1 tablet (500 mg total) by mouth as needed (genital herpes)., Disp: 90 tablet, Rfl: 0   valsartan -hydrochlorothiazide  (DIOVAN  HCT) 160-12.5 MG tablet, Take 1 tablet by mouth daily., Disp: 90 tablet, Rfl: 2   Vitamin D , Cholecalciferol, 25 MCG (1000 UT) TABS, Take 5,000 Units by mouth daily. , Disp: , Rfl:    zaleplon  (SONATA ) 10 MG capsule, Take 1 capsule (10 mg total) by mouth daily., Disp: 30 capsule, Rfl: 3   zaleplon  (SONATA ) 10 MG capsule, Take 1 capsule (10 mg total) by mouth daily., Disp: 30 capsule, Rfl: 3   No Known Allergies   Review of Systems  Constitutional: Negative.   Eyes: Negative.  Negative for  blurred vision.  Respiratory:  Negative for shortness of breath.   Cardiovascular:  Negative for chest pain and palpitations.  Musculoskeletal: Negative.   Skin: Negative.   Hematological: Negative.   Psychiatric/Behavioral: Negative.       Today's Vitals   01/28/24 1441  BP: 114/80  Pulse: 64  Temp: 98.2 F (36.8 C)  TempSrc: Oral  Weight: 298 lb (135.2 kg)  Height: 5' 3 (1.6 m)  PainSc: 0-No pain   Body mass index is 52.79 kg/m.  Wt Readings from Last 3 Encounters:  01/28/24 298 lb (135.2 kg)  11/13/23 295 lb (133.8 kg)  07/11/23 (!) 302 lb 12.8 oz (137.3 kg)  The 10-year ASCVD risk score (Arnett DK, et al., 2019) is: 3.1%   Values used to calculate the score:     Age: 8 years     Clincally relevant sex: Female     Is Non-Hispanic African American: Yes     Diabetic: No     Tobacco smoker: No     Systolic Blood Pressure: 114 mmHg     Is BP treated: Yes     HDL Cholesterol: 56 mg/dL     Total Cholesterol: 228 mg/dL  Objective:  Physical Exam Vitals and nursing note reviewed.  Constitutional:      Appearance: Normal appearance. She is obese.  HENT:     Head: Normocephalic and atraumatic.  Eyes:     Extraocular Movements: Extraocular movements intact.  Cardiovascular:     Rate and Rhythm: Normal rate and regular rhythm.     Heart sounds: Normal heart sounds.  Pulmonary:     Effort: Pulmonary effort is normal.     Breath sounds: Normal breath sounds.  Musculoskeletal:     Cervical back: Normal range of motion.  Skin:    General: Skin is warm.  Neurological:     General: No focal deficit present.     Mental Status: She is alert.  Psychiatric:        Mood and Affect: Mood normal.        Behavior: Behavior normal.         Assessment And Plan:  Essential hypertension Assessment & Plan: Blood pressure controlled.  She will continue with amlodipine  5mg  daily, nebivolol  10mg  and valsartan /hct 160/12.5mg  daily. She is reminded to follow a low sodium  diet. She will f/u in four months for re-evaluation.   - She anticipated potential discontinuation of antihypertensive medications upon retirement.  Orders: -     BMP8+EGFR  ADD (attention deficit disorder) without hyperactivity Assessment & Plan: ADHD managed with Adderall prescribed by Dr. Darra. Multiple chart entries for Adderall require consolidation. - Continue Adderall as prescribed by Dr. Darra.    Pure hypercholesterolemia Assessment & Plan: LDL cholesterol elevated at 156-158 mg/dL. Cardiac calcium score 0, indicating low coronary artery disease risk. She increased physical activity and considered dietary modifications. - Increase physical activity. - Consider increasing fiber intake. - Reassess cholesterol levels at next physical exam.   Other abnormal glucose Assessment & Plan: Previous labs reviewed, her A1c has been elevated in the past. I will check an A1c today. Reminded to avoid refined sugars including sugary drinks/foods and processed meats including bacon, sausages and deli meats.    Orders: -     BMP8+EGFR -     Hemoglobin A1c  Class 3 severe obesity due to excess calories with serious comorbidity and body mass index (BMI) of 50.0 to 59.9 in adult Assessment & Plan: Chronic, BMI 52.  She is encouraged to aim to lose ten percent of her body weight to decrease cardiac risk. Advised to aim for at least 150 minutes of exercise/week, while also incorporating strength training at least twice weekly.    Need for pneumococcal 20-valent conjugate vaccination -     Pneumococcal conjugate vaccine 20-valent   Return if symptoms worsen or fail to improve.  Patient was given opportunity to ask questions. Patient verbalized understanding of the plan and was able to repeat key elements of the plan. All questions were answered to their satisfaction.    I, Catheryn LOISE Slocumb, MD, have reviewed all documentation for this visit. The documentation on 01/28/24 for  the exam,  diagnosis, procedures, and orders are all accurate and complete.   IF YOU HAVE BEEN REFERRED TO A SPECIALIST, IT MAY TAKE 1-2 WEEKS TO SCHEDULE/PROCESS THE REFERRAL. IF YOU HAVE NOT HEARD FROM US /SPECIALIST IN TWO WEEKS, PLEASE GIVE US  A CALL AT [phone].

## 2024-01-28 NOTE — Patient Instructions (Signed)
 Hypertension, Adult Hypertension is another name for high blood pressure. High blood pressure forces your heart to work harder to pump blood. This can cause problems over time. There are two numbers in a blood pressure reading. There is a top number (systolic) over a bottom number (diastolic). It is best to have a blood pressure that is below 120/80. What are the causes? The cause of this condition is not known. Some other conditions can lead to high blood pressure. What increases the risk? Some lifestyle factors can make you more likely to develop high blood pressure: Smoking. Not getting enough exercise or physical activity. Being overweight. Having too much fat, sugar, calories, or salt (sodium) in your diet. Drinking too much alcohol. Other risk factors include: Having any of these conditions: Heart disease. Diabetes. High cholesterol. Kidney disease. Obstructive sleep apnea. Having a family history of high blood pressure and high cholesterol. Age. The risk increases with age. Stress. What are the signs or symptoms? High blood pressure may not cause symptoms. Very high blood pressure (hypertensive crisis) may cause: Headache. Fast or uneven heartbeats (palpitations). Shortness of breath. Nosebleed. Vomiting or feeling like you may vomit (nauseous). Changes in how you see. Very bad chest pain. Feeling dizzy. Seizures. How is this treated? This condition is treated by making healthy lifestyle changes, such as: Eating healthy foods. Exercising more. Drinking less alcohol. Your doctor may prescribe medicine if lifestyle changes do not help enough and if: Your top number is above 130. Your bottom number is above 80. Your personal target blood pressure may vary. Follow these instructions at home: Eating and drinking  If told, follow the DASH eating plan. To follow this plan: Fill one half of your plate at each meal with fruits and vegetables. Fill one fourth of your plate  at each meal with whole grains. Whole grains include whole-wheat pasta, brown rice, and whole-grain bread. Eat or drink low-fat dairy products, such as skim milk or low-fat yogurt. Fill one fourth of your plate at each meal with low-fat (lean) proteins. Low-fat proteins include fish, chicken without skin, eggs, beans, and tofu. Avoid fatty meat, cured and processed meat, or chicken with skin. Avoid pre-made or processed food. Limit the amount of salt in your diet to less than 1,500 mg each day. Do not drink alcohol if: Your doctor tells you not to drink. You are pregnant, may be pregnant, or are planning to become pregnant. If you drink alcohol: Limit how much you have to: 0-1 drink a day for women. 0-2 drinks a day for men. Know how much alcohol is in your drink. In the [place] one drink equals one 12 oz bottle of beer (355 mL), one 5 oz glass of wine (148 mL), or one 1 oz glass of hard liquor (44 mL). Lifestyle  Work with your doctor to stay at a healthy weight or to lose weight. Ask your doctor what the best weight is for you. Get at least 30 minutes of exercise that causes your heart to beat faster (aerobic exercise) most days of the week. This may include walking, swimming, or biking. Get at least 30 minutes of exercise that strengthens your muscles (resistance exercise) at least 3 days a week. This may include lifting weights or doing Pilates. Do not smoke or use any products that contain nicotine or tobacco. If you need help quitting, ask your doctor. Check your blood pressure at home as told by your doctor. Keep all follow-up visits. Medicines Take over-the-counter and prescription medicines  only as told by your doctor. Follow directions carefully. Do not skip doses of blood pressure medicine. The medicine does not work as well if you skip doses. Skipping doses also puts you at risk for problems. Ask your doctor about side effects or reactions to medicines that you should watch  for. Contact a doctor if: You think you are having a reaction to the medicine you are taking. You have headaches that keep coming back. You feel dizzy. You have swelling in your ankles. You have trouble with your vision. Get help right away if: You get a very bad headache. You start to feel mixed up (confused). You feel weak or numb. You feel faint. You have very bad pain in your: Chest. Belly (abdomen). You vomit more than once. You have trouble breathing. These symptoms may be an emergency. Get help right away. Call 911. Do not wait to see if the symptoms will go away. Do not drive yourself to the hospital. Summary Hypertension is another name for high blood pressure. High blood pressure forces your heart to work harder to pump blood. For most people, a normal blood pressure is less than 120/80. Making healthy choices can help lower blood pressure. If your blood pressure does not get lower with healthy choices, you may need to take medicine. This information is not intended to replace advice given to you by your health care provider. Make sure you discuss any questions you have with your health care provider. Document Revised: 03/31/2021 Document Reviewed: 03/31/2021 Elsevier Patient Education  2024 [employer].

## 2024-01-29 ENCOUNTER — Ambulatory Visit: Payer: Self-pay | Admitting: Internal Medicine

## 2024-01-29 LAB — BMP8+EGFR
BUN/Creatinine Ratio: 15 (ref 9–23)
BUN: 11 mg/dL (ref 6–24)
CO2: 27 mmol/L (ref 20–29)
Calcium: 9.6 mg/dL (ref 8.7–10.2)
Chloride: 98 mmol/L (ref 96–106)
Creatinine, Ser: 0.72 mg/dL (ref 0.57–1.00)
Glucose: 84 mg/dL (ref 70–99)
Potassium: 3.6 mmol/L (ref 3.5–5.2)
Sodium: 142 mmol/L (ref 134–144)
eGFR: 100 mL/min/1.73 (ref >59)

## 2024-01-29 LAB — HEMOGLOBIN A1C
Est. average glucose Bld gHb Est-mCnc: 123 mg/dL
Hgb A1c MFr Bld: 5.9 % — ABNORMAL HIGH (ref 4.8–5.6)

## 2024-01-30 DIAGNOSIS — E7800 Pure hypercholesterolemia, unspecified: Secondary | ICD-10-CM | POA: Insufficient documentation

## 2024-01-30 NOTE — Assessment & Plan Note (Signed)
 Chronic, BMI 52.  She is encouraged to aim to lose ten percent of her body weight to decrease cardiac risk. Advised to aim for at least 150 minutes of exercise/week, while also incorporating strength training at least twice weekly.

## 2024-01-30 NOTE — Assessment & Plan Note (Signed)
 LDL cholesterol elevated at 156-158 mg/dL. Cardiac calcium score 0, indicating low coronary artery disease risk. She increased physical activity and considered dietary modifications. - Increase physical activity. - Consider increasing fiber intake. - Reassess cholesterol levels at next physical exam.

## 2024-01-30 NOTE — Assessment & Plan Note (Signed)
 Previous labs reviewed, her A1c has been elevated in the past. I will check an A1c today. Reminded to avoid refined sugars including sugary drinks/foods and processed meats including bacon, sausages and deli meats.

## 2024-01-30 NOTE — Assessment & Plan Note (Signed)
 Blood pressure controlled.  She will continue with amlodipine  5mg  daily, nebivolol  10mg  and valsartan /hct 160/12.5mg  daily. She is reminded to follow a low sodium diet. She will f/u in four months for re-evaluation.   - She anticipated potential discontinuation of antihypertensive medications upon retirement.

## 2024-01-30 NOTE — Assessment & Plan Note (Signed)
 ADHD managed with Adderall prescribed by Dr. Darra. Multiple chart entries for Adderall require consolidation. - Continue Adderall as prescribed by Dr. Darra.

## 2024-02-12 ENCOUNTER — Other Ambulatory Visit: Payer: Self-pay | Admitting: Internal Medicine

## 2024-02-14 ENCOUNTER — Other Ambulatory Visit (HOSPITAL_COMMUNITY): Payer: Self-pay

## 2024-02-14 ENCOUNTER — Other Ambulatory Visit: Payer: Self-pay | Admitting: Internal Medicine

## 2024-02-14 DIAGNOSIS — I10 Essential (primary) hypertension: Secondary | ICD-10-CM

## 2024-02-22 ENCOUNTER — Other Ambulatory Visit (HOSPITAL_COMMUNITY): Payer: Self-pay

## 2024-04-04 ENCOUNTER — Other Ambulatory Visit (HOSPITAL_COMMUNITY): Payer: Self-pay

## 2024-04-10 ENCOUNTER — Other Ambulatory Visit (HOSPITAL_COMMUNITY): Payer: Self-pay

## 2024-04-11 ENCOUNTER — Other Ambulatory Visit (HOSPITAL_COMMUNITY): Payer: Self-pay

## 2024-05-14 ENCOUNTER — Other Ambulatory Visit (HOSPITAL_COMMUNITY): Payer: Self-pay

## 2024-05-14 ENCOUNTER — Other Ambulatory Visit: Payer: Self-pay

## 2024-05-14 MED ORDER — AMPHETAMINE-DEXTROAMPHET ER 30 MG PO CP24
30.0000 mg | ORAL_CAPSULE | Freq: Every day | ORAL | 0 refills | Status: DC
  Filled 2024-05-14: qty 30, 30d supply, fill #0

## 2024-05-20 ENCOUNTER — Other Ambulatory Visit (HOSPITAL_COMMUNITY): Payer: Self-pay

## 2024-05-20 MED ORDER — METHOCARBAMOL 500 MG PO TABS
500.0000 mg | ORAL_TABLET | Freq: Four times a day (QID) | ORAL | 2 refills | Status: AC | PRN
  Filled 2024-05-20: qty 60, 10d supply, fill #0

## 2024-05-21 ENCOUNTER — Other Ambulatory Visit (HOSPITAL_COMMUNITY): Payer: Self-pay

## 2024-05-21 MED ORDER — ZALEPLON 10 MG PO CAPS
10.0000 mg | ORAL_CAPSULE | Freq: Every day | ORAL | 3 refills | Status: AC
Start: 2024-05-21 — End: ?

## 2024-05-21 MED ORDER — SERTRALINE HCL 100 MG PO TABS
150.0000 mg | ORAL_TABLET | Freq: Every day | ORAL | 1 refills | Status: AC
  Filled 2024-05-21: qty 135, 90d supply, fill #0

## 2024-05-21 MED ORDER — AMPHETAMINE-DEXTROAMPHET ER 30 MG PO CP24
30.0000 mg | ORAL_CAPSULE | Freq: Every day | ORAL | 0 refills | Status: AC
Start: 2024-05-21 — End: ?

## 2024-05-21 MED ORDER — AMPHETAMINE-DEXTROAMPHET ER 30 MG PO CP24
30.0000 mg | ORAL_CAPSULE | Freq: Every day | ORAL | 0 refills | Status: AC
Start: 2024-06-18 — End: ?

## 2024-05-28 ENCOUNTER — Other Ambulatory Visit (HOSPITAL_COMMUNITY): Payer: Self-pay

## 2024-06-09 ENCOUNTER — Other Ambulatory Visit: Payer: Self-pay

## 2024-06-09 ENCOUNTER — Encounter: Payer: Self-pay | Admitting: Internal Medicine

## 2024-06-09 ENCOUNTER — Other Ambulatory Visit (HOSPITAL_COMMUNITY): Payer: Self-pay

## 2024-06-09 DIAGNOSIS — I10 Essential (primary) hypertension: Secondary | ICD-10-CM

## 2024-06-09 MED ORDER — NEBIVOLOL HCL 10 MG PO TABS: ORAL_TABLET | ORAL | 1 refills | Status: AC

## 2024-06-09 MED ORDER — METHOCARBAMOL 500 MG PO TABS
500.0000 mg | ORAL_TABLET | Freq: Four times a day (QID) | ORAL | 2 refills | Status: AC | PRN
  Filled 2024-06-09: qty 60, 15d supply, fill #0

## 2024-06-24 ENCOUNTER — Ambulatory Visit (INDEPENDENT_AMBULATORY_CARE_PROVIDER_SITE_OTHER)

## 2024-06-24 VITALS — BP 120/84 | HR 67 | Temp 98.2°F | Ht 63.0 in | Wt 298.0 lb

## 2024-06-24 DIAGNOSIS — Z23 Encounter for immunization: Secondary | ICD-10-CM | POA: Diagnosis not present

## 2024-06-24 NOTE — Progress Notes (Signed)
 Patient presented today for a flu vaccine. Patient received injection in her left deltoid. Patient tolerated injection well. YL,RMA

## 2024-07-16 ENCOUNTER — Ambulatory Visit (INDEPENDENT_AMBULATORY_CARE_PROVIDER_SITE_OTHER): Payer: 59 | Admitting: Internal Medicine

## 2024-07-16 ENCOUNTER — Ambulatory Visit: Payer: Self-pay | Admitting: Internal Medicine

## 2024-07-16 ENCOUNTER — Encounter: Payer: Self-pay | Admitting: Internal Medicine

## 2024-07-16 ENCOUNTER — Other Ambulatory Visit (HOSPITAL_COMMUNITY): Payer: Self-pay

## 2024-07-16 VITALS — BP 128/80 | HR 67 | Temp 98.3°F | Ht 63.0 in | Wt 296.0 lb

## 2024-07-16 DIAGNOSIS — F988 Other specified behavioral and emotional disorders with onset usually occurring in childhood and adolescence: Secondary | ICD-10-CM | POA: Diagnosis not present

## 2024-07-16 DIAGNOSIS — I10 Essential (primary) hypertension: Secondary | ICD-10-CM

## 2024-07-16 DIAGNOSIS — R0982 Postnasal drip: Secondary | ICD-10-CM

## 2024-07-16 DIAGNOSIS — Z1159 Encounter for screening for other viral diseases: Secondary | ICD-10-CM | POA: Diagnosis not present

## 2024-07-16 DIAGNOSIS — E7800 Pure hypercholesterolemia, unspecified: Secondary | ICD-10-CM | POA: Diagnosis not present

## 2024-07-16 DIAGNOSIS — J309 Allergic rhinitis, unspecified: Secondary | ICD-10-CM | POA: Diagnosis not present

## 2024-07-16 DIAGNOSIS — H6121 Impacted cerumen, right ear: Secondary | ICD-10-CM | POA: Diagnosis not present

## 2024-07-16 DIAGNOSIS — Z0000 Encounter for general adult medical examination without abnormal findings: Secondary | ICD-10-CM

## 2024-07-16 DIAGNOSIS — H9193 Unspecified hearing loss, bilateral: Secondary | ICD-10-CM

## 2024-07-16 DIAGNOSIS — G4733 Obstructive sleep apnea (adult) (pediatric): Secondary | ICD-10-CM | POA: Diagnosis not present

## 2024-07-16 LAB — POCT URINALYSIS DIPSTICK
Bilirubin, UA: NEGATIVE
Glucose, UA: NEGATIVE
Ketones, UA: NEGATIVE
Leukocytes, UA: NEGATIVE
Nitrite, UA: NEGATIVE
Protein, UA: NEGATIVE
Spec Grav, UA: 1.025
Urobilinogen, UA: 0.2 U/dL
pH, UA: 7

## 2024-07-16 MED ORDER — AMPHETAMINE-DEXTROAMPHET ER 30 MG PO CP24
30.0000 mg | ORAL_CAPSULE | Freq: Every day | ORAL | 0 refills | Status: AC
  Filled 2024-07-16: qty 30, 30d supply, fill #0

## 2024-07-16 MED ORDER — AZELASTINE HCL 0.1 % NA SOLN: 1.0000 | Freq: Two times a day (BID) | NASAL | 12 refills | Status: AC

## 2024-07-16 MED ORDER — ZALEPLON 10 MG PO CAPS
10.0000 mg | ORAL_CAPSULE | Freq: Every day | ORAL | 3 refills | Status: AC
  Filled 2024-07-16: qty 30, 30d supply, fill #0

## 2024-07-16 NOTE — Assessment & Plan Note (Addendum)
 Chronic, fair control. Goal BP <130/80.  EKG performed, NSR w/ LVH and nonspecific T abnormality.  She will continue with amlodipine  5mg  daily, nebivolol  10mg  and valsartan /hct 160/12.5mg  daily. She is reminded to follow a low sodium diet. She will f/u in four months for re-evaluation.   - She anticipated potential discontinuation of antihypertensive medications upon retirement. - She will follow up in six months.

## 2024-07-16 NOTE — Assessment & Plan Note (Signed)

## 2024-07-16 NOTE — Progress Notes (Signed)
 I,Victoria T Emmitt, CMA,acting as a neurosurgeon for Brooke LOISE Slocumb, MD.,have documented all relevant documentation on the behalf of Brooke LOISE Slocumb, MD,as directed by  Brooke LOISE Slocumb, MD while in the presence of Brooke LOISE Slocumb, MD.  Subjective:  Patient ID: Brooke [name] , female    DOB: [birth year] , 54 y.o.   MRN: [id number]  Chief Complaint  Patient presents with   Hypertension    The patient is here today for a physical examination. She is followed by Dr. Storm at [facility] for her GYN care. She reports compliance with medications. Denies headache, chest pain & sob.    Hyperlipidemia    HPI Discussed the use of AI scribe software for clinical note transcription with the patient, who gave verbal consent to proceed.  History of Present Illness Brooke [name] is a 54 year old female who presents for a physical exam and blood pressure check.  She has been experiencing hearing issues, particularly in her right ear, and has had ear wax flushed out once before. She is concerned about her hearing due to her family history, as her mother uses hearing aids and her father should wear them but does not. She occasionally experiences tinnitus but not frequently enough to track. A referral for her hearing issues was previously sent, but she did not receive a call.  She has been experiencing postnasal drip and sinus drainage, which started the day before the visit. No associated illness is reported, but her nose is constantly running, especially at night. She has not been taking any medication for this but is open to using a nasal spray.  She has a history of sleep apnea and has not been using her CPAP machine due to a lack of supplies. She reports gaining significant weight since her last sleep study in 2019, when she weighed 80 pounds less. She experiences headaches at night.  She is interested in weight loss options and mentions that her insurance does not cover certain medications unless she is  diabetic, which she is not.  She needs to reconnect with her OBGYN, Dr. Storm, who has moved to a new location. She is due for a PAP smear and a mammogram.   Hypertension This is a chronic problem. The current episode started more than 1 year ago. The problem has been gradually improving since onset. The problem is controlled. Pertinent negatives include no blurred vision, chest pain, palpitations or shortness of breath. Risk factors for coronary artery disease include obesity. Past treatments include diuretics, beta blockers and calcium channel blockers. The current treatment provides mild improvement. Compliance problems include exercise.  There is no history of kidney disease, CVA or heart failure.  Hyperlipidemia Pertinent negatives include no chest pain or shortness of breath.     Past Medical History:  Diagnosis Date   ADD (attention deficit disorder)    Allergy    allergic rhinitis   Anemia    nos   Anxiety    Arthritis    right ankle   Asthma    as a child   Back pain    Constipation    Depression    Elevated blood pressure reading without diagnosis of hypertension    Family history of breast cancer    Family history of colon cancer    Family history of ovarian cancer    Family history of uterine cancer    Genital herpes    Lactose intolerance    Migraine    Prediabetes  Sleep apnea    Stress    Swelling      Family History  Problem Relation Age of Onset   Hypertension Mother    Diabetes Mother    Hyperlipidemia Mother    Hypertension Father    Sleep apnea Father    Alcoholism Father    Obesity Father    Breast cancer Sister 75   Uterine cancer Sister 59   Diabetes Maternal Aunt    Diabetes Maternal Uncle    Heart failure Paternal Aunt    Diabetes Paternal Aunt    Lymphoma Paternal Aunt    Heart failure Maternal Grandmother    Heart failure Maternal Grandfather    Ovarian cancer Cousin        mat first cousin dx in her 29s   Colon cancer Cousin         pat first cousin dx in her 59s     Current Outpatient Medications:    amLODipine  (NORVASC ) 5 MG tablet, TAKE 1 TABLET(5 MG) BY MOUTH DAILY, Disp: 90 tablet, Rfl: 2   amphetamine -dextroamphetamine  (ADDERALL XR) 30 MG 24 hr capsule, Take 1 capsule (30 mg total) by mouth daily., Disp: 30 capsule, Rfl: 0   amphetamine -dextroamphetamine  (ADDERALL XR) 30 MG 24 hr capsule, Take 1 capsule (30 mg total) by mouth daily., Disp: 30 capsule, Rfl: 0   amphetamine -dextroamphetamine  (ADDERALL XR) 30 MG 24 hr capsule, Take 1 capsule (30 mg total) by mouth daily., Disp: 30 capsule, Rfl: 0   azelastine  (ASTELIN ) 0.1 % nasal spray, Place 1 spray into both nostrils 2 (two) times daily. Use in each nostril as directed, Disp: 30 mL, Rfl: 12   methocarbamol  (ROBAXIN ) 500 MG tablet, Take 1-2 tablets (500-1,000 mg total) by mouth every 6 (six) hours as needed for muscle spasm/tension., Disp: 60 tablet, Rfl: 2   methocarbamol  (ROBAXIN ) 500 MG tablet, Take 1-2 tablet (500-1,000 mg total) by mouth every 6 (six) hours as needed for muscle spasm/tension., Disp: 60 tablet, Rfl: 2   Multiple Vitamin (MULTIVITAMIN) tablet, Take 1 tablet by mouth daily., Disp: , Rfl:    nebivolol  (BYSTOLIC ) 10 MG tablet, TAKE 1 TABLET BY MOUTH DAILY., Disp: 90 tablet, Rfl: 1   sertraline  (ZOLOFT ) 100 MG tablet, Take 1&1/2 tablets (150 mg total) by mouth daily., Disp: 135 tablet, Rfl: 1   sertraline  (ZOLOFT ) 100 MG tablet, Take 1 & 1/2 tablets (150 mg total) by mouth daily., Disp: 135 tablet, Rfl: 1   sertraline  (ZOLOFT ) 100 MG tablet, Take 1&1/2 tablets (150 mg total) by mouth daily., Disp: 135 tablet, Rfl: 1   valACYclovir  (VALTREX ) 500 MG tablet, Take 1 tablet (500 mg total) by mouth as needed (genital herpes)., Disp: 90 tablet, Rfl: 0   valsartan -hydrochlorothiazide  (DIOVAN -HCT) 160-12.5 MG tablet, TAKE 1 TABLET BY MOUTH DAILY, Disp: 90 tablet, Rfl: 2   Vitamin D , Cholecalciferol, 25 MCG (1000 UT) TABS, Take 5,000 Units by mouth daily. ,  Disp: , Rfl:    zaleplon  (SONATA ) 10 MG capsule, Take 1 capsule (10 mg total) by mouth daily., Disp: 30 capsule, Rfl: 3   zaleplon  (SONATA ) 10 MG capsule, Take 1 capsule (10 mg total) by mouth daily., Disp: 30 capsule, Rfl: 3   amphetamine -dextroamphetamine  (ADDERALL XR) 30 MG 24 hr capsule, Take 1 capsule (30 mg total) by mouth daily., Disp: 30 capsule, Rfl: 0   zaleplon  (SONATA ) 10 MG capsule, Take 1 capsule (10 mg total) by mouth daily., Disp: 30 capsule, Rfl: 3   No Known Allergies   Review of  Systems  Constitutional: Negative.   HENT:  Positive for hearing loss.   Eyes: Negative.  Negative for blurred vision.  Respiratory: Negative.  Negative for shortness of breath.   Cardiovascular: Negative.  Negative for chest pain and palpitations.  Gastrointestinal: Negative.   Endocrine: Negative.   Genitourinary: Negative.   Musculoskeletal: Negative.   Skin: Negative.   Allergic/Immunologic: Negative.   Neurological: Negative.   Hematological: Negative.   Psychiatric/Behavioral: Negative.       Today's Vitals   07/16/24 0845  BP: 128/80  Pulse: 67  Temp: 98.3 F (36.8 C)  SpO2: 98%  Weight: 296 lb (134.3 kg)  Height: 5' 3 (1.6 m)   Body mass index is 52.43 kg/m.  Wt Readings from Last 3 Encounters:  07/16/24 296 lb (134.3 kg)  06/24/24 298 lb (135.2 kg)  01/28/24 298 lb (135.2 kg)    BP Readings from Last 3 Encounters:  07/16/24 128/80  06/24/24 120/84  01/28/24 114/80    Objective:  Physical Exam Vitals and nursing note reviewed.  Constitutional:      Appearance: Normal appearance. She is obese.  HENT:     Head: Normocephalic and atraumatic.     Right Ear: Ear canal and external ear normal. There is impacted cerumen.     Left Ear: Tympanic membrane, ear canal and external ear normal.     Nose: Nose normal.     Mouth/Throat:     Mouth: Mucous membranes are moist.     Pharynx: Oropharynx is clear.  Eyes:     Extraocular Movements: Extraocular movements  intact.     Conjunctiva/sclera: Conjunctivae normal.     Pupils: Pupils are equal, round, and reactive to light.  Cardiovascular:     Rate and Rhythm: Normal rate and regular rhythm.     Pulses: Normal pulses.     Heart sounds: Normal heart sounds.  Pulmonary:     Effort: Pulmonary effort is normal.     Breath sounds: Normal breath sounds.  Chest:  Breasts:    Tanner Score is 5.     Right: Normal.     Left: Normal.  Abdominal:     General: Bowel sounds are normal.     Palpations: Abdomen is soft.     Comments: Obese, difficult to assess organomegaly  Genitourinary:    Comments: deferred Musculoskeletal:        General: Normal range of motion.     Cervical back: Normal range of motion and neck supple.  Skin:    General: Skin is warm and dry.     Comments: Tattoo left arm  Neurological:     General: No focal deficit present.     Mental Status: She is alert and oriented to person, place, and time.  Psychiatric:        Mood and Affect: Mood normal.        Behavior: Behavior normal.         Assessment And Plan:  Encounter for general adult medical examination w/o abnormal findings Assessment & Plan: A full exam was performed.  Importance of monthly self breast exams was discussed with the patient.  She is advised to get 30-45 minutes of regular exercise, no less than four to five days per week. Both weight-bearing and aerobic exercises are recommended.  She is advised to follow a healthy diet with at least six fruits/veggies per day, decrease intake of red meat and other saturated fats and to increase fish intake to twice weekly.  Meats/fish  should not be fried -- baked, boiled or broiled is preferable. It is also important to cut back on your sugar intake.  Be sure to read labels - try to avoid anything with added sugar, high fructose corn syrup or other sweeteners.  If you must use a sweetener, you can try stevia or monkfruit.  It is also important to avoid artificially sweetened  foods/beverages and diet drinks. Lastly, wear SPF 50 sunscreen on exposed skin and when in direct sunlight for an extended period of time.  Be sure to avoid fast food restaurants and aim for at least 60 ounces of water daily.      Orders: -     CBC -     CMP14+EGFR -     Lipid panel -     Hemoglobin A1c -     Ambulatory referral to Gynecology -     Insulin , random  Essential hypertension Assessment & Plan: Chronic, fair control. Goal BP <130/80.  EKG performed, NSR w/ LVH and nonspecific T abnormality.  She will continue with amlodipine  5mg  daily, nebivolol  10mg  and valsartan /hct 160/12.5mg  daily. She is reminded to follow a low sodium diet. She will f/u in four months for re-evaluation.   - She anticipated potential discontinuation of antihypertensive medications upon retirement. - She will follow up in six months.  Orders: -     POCT urinalysis dipstick -     Microalbumin / creatinine urine ratio -     EKG 12-Lead  Pure hypercholesterolemia Assessment & Plan: LDL cholesterol elevated at 156-158 mg/dL. Cardiac calcium score 0, indicating low coronary artery disease risk. She increased physical activity and considered dietary modifications. - Increase physical activity. - Consider increasing fiber intake. - Lp(a) elevated, would benefit from statin therapy.    ADD (attention deficit disorder) without hyperactivity Assessment & Plan: ADHD managed with Adderall prescribed by Dr. Darra. Multiple chart entries for Adderall require consolidation. - Continue Adderall as prescribed by Dr. Darra.    Allergic rhinitis with postnasal drip Assessment & Plan: Symptoms of nasal congestion and postnasal drip noted. - Recommended Zyrtec or Allegra. - Prescribed nasal spray, Astelin    Decreased hearing of both ears -     Ambulatory referral to Audiology  Right ear impacted cerumen Assessment & Plan: After obtaining verbal consent, right ear was flushed by irrigation. she tolerated  procedure well without any complications. no TM abnormalities were noted.   Orders: -     Ear Lavage  OSA (obstructive sleep apnea) Assessment & Plan: Headaches at night, non-compliance with CPAP due to lack of supplies, weight gain noted. - Referred to neurology. - Advised to obtain new CPAP supplies. - Encouraged CPAP use.  Orders: -     Ambulatory referral to Neurology  Need for hepatitis B screening test -     Hepatitis B surface antibody,quantitative  Morbid obesity (HCC) Assessment & Plan: We discussed the use of oral Wegovy  to assist with weight loss efforts. She denies family history of thyroid  cancer.  - Pricing discussed with patient - $149 x 2 months, then $199, then $249 for higher doses - She will think about this further and discuss with me later.  - Aim for at least 150 minutes of exercise per week   Other orders -     Azelastine  HCl; Place 1 spray into both nostrils 2 (two) times daily. Use in each nostril as directed  Dispense: 30 mL; Refill: 12    Return for 1 year HM, 6 MONTH bp  F/U..  Patient was given opportunity to ask questions. Patient verbalized understanding of the plan and was able to repeat key elements of the plan. All questions were answered to their satisfaction.   I, Brooke LOISE Slocumb, MD, have reviewed all documentation for this visit. The documentation on 07/16/24 for the exam, diagnosis, procedures, and orders are all accurate and complete.   IF YOU HAVE BEEN REFERRED TO A SPECIALIST, IT MAY TAKE 1-2 WEEKS TO SCHEDULE/PROCESS THE REFERRAL. IF YOU HAVE NOT HEARD FROM US /SPECIALIST IN TWO WEEKS, PLEASE GIVE US  A CALL AT [phone].   THE PATIENT IS ENCOURAGED TO PRACTICE SOCIAL DISTANCING DUE TO THE COVID-19 PANDEMIC.

## 2024-07-16 NOTE — Assessment & Plan Note (Signed)
 After obtaining verbal consent, right ear was flushed by irrigation. she tolerated procedure well without any complications. no TM abnormalities were noted.

## 2024-07-16 NOTE — Assessment & Plan Note (Signed)
 ADHD managed with Adderall prescribed by Dr. Darra. Multiple chart entries for Adderall require consolidation. - Continue Adderall as prescribed by Dr. Darra.

## 2024-07-16 NOTE — Assessment & Plan Note (Addendum)
 LDL cholesterol elevated at 156-158 mg/dL. Cardiac calcium score 0, indicating low coronary artery disease risk. She increased physical activity and considered dietary modifications. - Increase physical activity. - Consider increasing fiber intake. - Lp(a) elevated, would benefit from statin therapy.

## 2024-07-16 NOTE — Patient Instructions (Signed)

## 2024-07-17 LAB — LIPID PANEL
Chol/HDL Ratio: 4 ratio (ref 0.0–4.4)
Cholesterol, Total: 239 mg/dL — ABNORMAL HIGH (ref 100–199)
HDL: 60 mg/dL
LDL Chol Calc (NIH): 162 mg/dL — ABNORMAL HIGH (ref 0–99)
Triglycerides: 96 mg/dL (ref 0–149)
VLDL Cholesterol Cal: 17 mg/dL (ref 5–40)

## 2024-07-17 LAB — CMP14+EGFR
ALT: 21 IU/L (ref 0–32)
AST: 24 IU/L (ref 0–40)
Albumin: 4.4 g/dL (ref 3.8–4.9)
Alkaline Phosphatase: 141 IU/L — ABNORMAL HIGH (ref 49–135)
BUN/Creatinine Ratio: 13 (ref 9–23)
BUN: 12 mg/dL (ref 6–24)
Bilirubin Total: 0.4 mg/dL (ref 0.0–1.2)
CO2: 23 mmol/L (ref 20–29)
Calcium: 9.9 mg/dL (ref 8.7–10.2)
Chloride: 99 mmol/L (ref 96–106)
Creatinine, Ser: 0.9 mg/dL (ref 0.57–1.00)
Globulin, Total: 4.1 g/dL (ref 1.5–4.5)
Glucose: 94 mg/dL (ref 70–99)
Potassium: 4.6 mmol/L (ref 3.5–5.2)
Sodium: 140 mmol/L (ref 134–144)
Total Protein: 8.5 g/dL (ref 6.0–8.5)
eGFR: 76 mL/min/1.73

## 2024-07-17 LAB — CBC
Hematocrit: 44.1 % (ref 34.0–46.6)
Hemoglobin: 13.6 g/dL (ref 11.1–15.9)
MCH: 24.8 pg — ABNORMAL LOW (ref 26.6–33.0)
MCHC: 30.8 g/dL — ABNORMAL LOW (ref 31.5–35.7)
MCV: 81 fL (ref 79–97)
Platelets: 472 x10E3/uL — ABNORMAL HIGH (ref 150–450)
RBC: 5.48 x10E6/uL — ABNORMAL HIGH (ref 3.77–5.28)
RDW: 15.1 % (ref 11.7–15.4)
WBC: 8.8 x10E3/uL (ref 3.4–10.8)

## 2024-07-17 LAB — MICROALBUMIN / CREATININE URINE RATIO
Creatinine, Urine: 277.1 mg/dL
Microalb/Creat Ratio: 6 mg/g{creat} (ref 0–29)
Microalbumin, Urine: 15.5 ug/mL

## 2024-07-17 LAB — HEMOGLOBIN A1C
Est. average glucose Bld gHb Est-mCnc: 123 mg/dL
Hgb A1c MFr Bld: 5.9 % — ABNORMAL HIGH (ref 4.8–5.6)

## 2024-07-17 LAB — HEPATITIS B SURFACE ANTIBODY, QUANTITATIVE: Hepatitis B Surf Ab Quant: <3.5 m[IU]/mL — ABNORMAL LOW

## 2024-07-17 LAB — INSULIN, RANDOM: INSULIN: 22.7 u[IU]/mL (ref 2.6–24.9)

## 2024-07-20 DIAGNOSIS — G4733 Obstructive sleep apnea (adult) (pediatric): Secondary | ICD-10-CM | POA: Insufficient documentation

## 2024-07-20 NOTE — Assessment & Plan Note (Addendum)
 Symptoms of nasal congestion and postnasal drip noted. - Recommended Zyrtec or Allegra. - Prescribed nasal spray, Astelin 

## 2024-07-20 NOTE — Assessment & Plan Note (Signed)
 Headaches at night, non-compliance with CPAP due to lack of supplies, weight gain noted. - Referred to neurology. - Advised to obtain new CPAP supplies. - Encouraged CPAP use.

## 2024-07-20 NOTE — Assessment & Plan Note (Signed)
 We discussed the use of oral Wegovy  to assist with weight loss efforts. She denies family history of thyroid  cancer.  - Pricing discussed with patient - $149 x 2 months, then $199, then $249 for higher doses - She will think about this further and discuss with me later.  - Aim for at least 150 minutes of exercise per week

## 2024-07-26 ENCOUNTER — Other Ambulatory Visit (HOSPITAL_COMMUNITY): Payer: Self-pay

## 2024-07-26 MED ORDER — AMPHETAMINE-DEXTROAMPHET ER 30 MG PO CP24: 30.0000 mg | ORAL_CAPSULE | Freq: Every day | ORAL | 0 refills | Status: AC

## 2024-07-26 MED ORDER — SERTRALINE HCL 100 MG PO TABS: 150.0000 mg | ORAL_TABLET | Freq: Every day | ORAL | 1 refills | Status: AC

## 2024-07-26 MED ORDER — ZALEPLON 10 MG PO CAPS: 10.0000 mg | ORAL_CAPSULE | Freq: Every day | ORAL | 3 refills | Status: AC

## 2024-07-29 ENCOUNTER — Ambulatory Visit: Admitting: Audiology

## 2024-08-06 ENCOUNTER — Ambulatory Visit: Admitting: Audiologist

## 2025-01-20 ENCOUNTER — Ambulatory Visit: Payer: Self-pay | Admitting: Internal Medicine

## 2025-07-21 ENCOUNTER — Encounter: Payer: Self-pay | Admitting: Internal Medicine
